# Patient Record
Sex: Female | Born: 1940 | Race: White | Hispanic: No | Smoking: Former smoker
Health system: Southern US, Community
[De-identification: ages and names within clinical notes are randomized; demographics above are authoritative.]

## PROBLEM LIST (undated history)

## (undated) DIAGNOSIS — E039 Hypothyroidism, unspecified: Secondary | ICD-10-CM

## (undated) DIAGNOSIS — R0600 Dyspnea, unspecified: Secondary | ICD-10-CM

## (undated) DIAGNOSIS — E119 Type 2 diabetes mellitus without complications: Secondary | ICD-10-CM

## (undated) DIAGNOSIS — M109 Gout, unspecified: Secondary | ICD-10-CM

## (undated) DIAGNOSIS — I739 Peripheral vascular disease, unspecified: Secondary | ICD-10-CM

## (undated) DIAGNOSIS — R569 Unspecified convulsions: Secondary | ICD-10-CM

## (undated) DIAGNOSIS — G4730 Sleep apnea, unspecified: Secondary | ICD-10-CM

## (undated) DIAGNOSIS — M1990 Unspecified osteoarthritis, unspecified site: Secondary | ICD-10-CM

## (undated) DIAGNOSIS — E785 Hyperlipidemia, unspecified: Secondary | ICD-10-CM

## (undated) DIAGNOSIS — I10 Essential (primary) hypertension: Secondary | ICD-10-CM

## (undated) HISTORY — PX: BREAST BIOPSY: SHX20

## (undated) HISTORY — DX: Hyperlipidemia, unspecified: E78.5

## (undated) HISTORY — DX: Unspecified osteoarthritis, unspecified site: M19.90

## (undated) HISTORY — DX: Gout, unspecified: M10.9

## (undated) HISTORY — PX: TOTAL KNEE ARTHROPLASTY: SHX125

## (undated) HISTORY — PX: MANDIBLE FRACTURE SURGERY: SHX706

## (undated) HISTORY — PX: JOINT REPLACEMENT: SHX530

## (undated) HISTORY — PX: DILATION AND CURETTAGE OF UTERUS: SHX78

---

## 1998-03-10 ENCOUNTER — Emergency Department (HOSPITAL_COMMUNITY): Admission: EM | Admit: 1998-03-10 | Discharge: 1998-03-10 | Payer: Self-pay | Admitting: Emergency Medicine

## 1998-05-08 ENCOUNTER — Other Ambulatory Visit: Admission: RE | Admit: 1998-05-08 | Discharge: 1998-05-08 | Payer: Self-pay

## 1999-07-27 ENCOUNTER — Other Ambulatory Visit: Admission: RE | Admit: 1999-07-27 | Discharge: 1999-07-27 | Payer: Self-pay | Admitting: Obstetrics and Gynecology

## 1999-07-27 ENCOUNTER — Encounter (INDEPENDENT_AMBULATORY_CARE_PROVIDER_SITE_OTHER): Payer: Self-pay

## 1999-08-06 ENCOUNTER — Ambulatory Visit (HOSPITAL_COMMUNITY): Admission: RE | Admit: 1999-08-06 | Discharge: 1999-08-06 | Payer: Self-pay | Admitting: Gastroenterology

## 2000-06-16 ENCOUNTER — Encounter: Payer: Self-pay | Admitting: Family Medicine

## 2000-06-16 ENCOUNTER — Encounter: Admission: RE | Admit: 2000-06-16 | Discharge: 2000-06-16 | Payer: Self-pay | Admitting: Family Medicine

## 2002-07-29 ENCOUNTER — Ambulatory Visit (HOSPITAL_BASED_OUTPATIENT_CLINIC_OR_DEPARTMENT_OTHER): Admission: RE | Admit: 2002-07-29 | Discharge: 2002-07-29 | Payer: Self-pay | Admitting: Orthopedic Surgery

## 2002-12-25 ENCOUNTER — Encounter: Payer: Self-pay | Admitting: Family Medicine

## 2002-12-25 ENCOUNTER — Encounter: Admission: RE | Admit: 2002-12-25 | Discharge: 2002-12-25 | Payer: Self-pay | Admitting: Family Medicine

## 2003-03-11 ENCOUNTER — Ambulatory Visit (HOSPITAL_COMMUNITY): Admission: RE | Admit: 2003-03-11 | Discharge: 2003-03-11 | Payer: Self-pay | Admitting: Family Medicine

## 2004-01-05 ENCOUNTER — Ambulatory Visit (HOSPITAL_COMMUNITY): Admission: RE | Admit: 2004-01-05 | Discharge: 2004-01-05 | Payer: Self-pay | Admitting: Family Medicine

## 2004-02-13 ENCOUNTER — Other Ambulatory Visit: Admission: RE | Admit: 2004-02-13 | Discharge: 2004-02-13 | Payer: Self-pay | Admitting: Family Medicine

## 2004-03-23 ENCOUNTER — Encounter: Admission: RE | Admit: 2004-03-23 | Discharge: 2004-06-21 | Payer: Self-pay | Admitting: Family Medicine

## 2004-04-07 ENCOUNTER — Ambulatory Visit (HOSPITAL_COMMUNITY): Admission: RE | Admit: 2004-04-07 | Discharge: 2004-04-07 | Payer: Self-pay | Admitting: Gastroenterology

## 2004-07-13 ENCOUNTER — Inpatient Hospital Stay (HOSPITAL_COMMUNITY): Admission: AD | Admit: 2004-07-13 | Discharge: 2004-07-25 | Payer: Self-pay | Admitting: Family Medicine

## 2005-02-23 ENCOUNTER — Other Ambulatory Visit: Admission: RE | Admit: 2005-02-23 | Discharge: 2005-02-23 | Payer: Self-pay | Admitting: Family Medicine

## 2005-08-29 ENCOUNTER — Ambulatory Visit (HOSPITAL_COMMUNITY): Admission: RE | Admit: 2005-08-29 | Discharge: 2005-08-29 | Payer: Self-pay | Admitting: Family Medicine

## 2006-05-31 ENCOUNTER — Inpatient Hospital Stay (HOSPITAL_COMMUNITY): Admission: RE | Admit: 2006-05-31 | Discharge: 2006-06-06 | Payer: Self-pay | Admitting: Orthopedic Surgery

## 2006-06-01 ENCOUNTER — Ambulatory Visit: Payer: Self-pay | Admitting: Physical Medicine & Rehabilitation

## 2006-10-06 ENCOUNTER — Ambulatory Visit (HOSPITAL_COMMUNITY): Admission: RE | Admit: 2006-10-06 | Discharge: 2006-10-06 | Payer: Self-pay | Admitting: Family Medicine

## 2006-10-10 ENCOUNTER — Emergency Department (HOSPITAL_COMMUNITY): Admission: EM | Admit: 2006-10-10 | Discharge: 2006-10-10 | Payer: Self-pay | Admitting: Family Medicine

## 2007-05-16 ENCOUNTER — Other Ambulatory Visit: Admission: RE | Admit: 2007-05-16 | Discharge: 2007-05-16 | Payer: Self-pay | Admitting: Family Medicine

## 2008-05-19 ENCOUNTER — Inpatient Hospital Stay (HOSPITAL_COMMUNITY): Admission: RE | Admit: 2008-05-19 | Discharge: 2008-05-24 | Payer: Self-pay | Admitting: Orthopedic Surgery

## 2009-05-28 ENCOUNTER — Encounter: Admission: RE | Admit: 2009-05-28 | Discharge: 2009-05-28 | Payer: Self-pay | Admitting: Family Medicine

## 2009-06-05 ENCOUNTER — Encounter: Admission: RE | Admit: 2009-06-05 | Discharge: 2009-06-05 | Payer: Self-pay | Admitting: Family Medicine

## 2010-09-07 DIAGNOSIS — D229 Melanocytic nevi, unspecified: Secondary | ICD-10-CM

## 2010-09-07 HISTORY — DX: Melanocytic nevi, unspecified: D22.9

## 2010-10-25 ENCOUNTER — Encounter: Payer: Self-pay | Admitting: Family Medicine

## 2011-02-15 NOTE — Op Note (Signed)
Sarah Bishop, Sarah Bishop                 ACCOUNT NO.:  [id number]   MEDICAL RECORD NO.:  [id number]          PATIENT TYPE:  INP   LOCATION:  2550                         FACILITY:  [facility]   PHYSICIAN:  Feliberto Gottron. Turner Daniels, M.D.   DATE OF BIRTH:  05-02-1941   DATE OF PROCEDURE:  05/19/2008  DATE OF DISCHARGE:                               OPERATIVE REPORT   PREOPERATIVE DIAGNOSIS:  End-stage arthritis left knee.   POSTOPERATIVE DIAGNOSIS:  End-stage arthritis left knee.   PROCEDURE:  Left total knee arthroplasty using DePuy Sigma RP components  4 femur, 4 tibia, 38 patella, 10-mm Sigma RP spacer.   SURGEON:  Feliberto Gottron.  Turner Daniels, MD   FIRST ASSISTANT:  Shirl Harris PA-C   ANESTHETIC:  General endotracheal and left femoral nerve block.   ESTIMATED BLOOD LOSS:  Minimal.   FLUID REPLACEMENT:  1500 mL of crystalloid.   DRAINS PLACED:  Foley catheter and two medium Hemovac.   URINE OUTPUT:  300 mL.   TOURNIQUET TIME:  1 hour and 40 minutes.   INDICATIONS FOR PROCEDURE:  A 70 year old woman has had a successful  cemented right total knee done by me a few years ago and now desires  same on the left side.  She is well aware of the risks and benefits of  surgery.  She has severe unremitting pain that affects her ability to do  activities of daily living and she desires to proceed with left total  knee arthroplasty.   DESCRIPTION OF PROCEDURE:  The patient was identified by armband and  underwent left femoral nerve block in the block area [facility].  She was then taken to operating room 1 where the appropriate anesthetic  monitors were attached and general endotracheal anesthesia was induced.  A Foley catheter was inserted.  She received 2 g of Ancef  preoperatively.  Tourniquet was then applied high at the left thigh.  Lateral post and foot positioner applied to the table and the left lower  extremity prepped and draped in the usual sterile fashion from the ankle  to the  tourniquet.  The limb was wrapped with an Esmarch bandage and the  tourniquet inflated to 350 mmHg.  I began the procedure by making an  anterior midline incision 20 cm in length centered over the patella.  Small bleeders in the skin and subcutaneous tissue were identified and  cauterized.  Transverse retinaculum was incised and reflected medially  allowing a medial parapatellar arthrotomy.  The patella was everted and  the prepatellar fat pad was resected.  We then sent about elevating the  superficial, medial, and collateral ligaments from anterior-posterior of  the proximal tibia leaving them intact distally.  The patella was then  everted and tucked under the fat of the skin laterally because of her  body habitus.  The knee was then hyperflexed exposing the notch and the  notch osteophytes were then removed with an osteotome allowing removal  of the ACL and PCL.  We then placed a posteromedial Z retractor, a  McGill retractor through the notch and a lateral [employer]  retractor  exposing the proximal tibia which was then entered with a step drill  followed by intramedullary rod and a 0 degree posterior slope cutting  guide set at 1 degree of posterior slope.  The cutting guide was pinned  into place allowing removal of 7-8 mm of bone medially, about 9 mm of  bone laterally because of her varus deformity and a good standard cut  was accomplished.  We then directed our attention to the distal femur  and entered the femoral canal 2 mm anterior to the PCL origin with a  step drill followed by an intramedullary rod with a cutting guide set at  11 mm for the distal cut 5 degrees left.  This was pinned into place  along the epicondylar axis and a distal femoral cut accomplished.  Using  the posterior referencing guide, we measured for a #3 femoral component,  set the cutting guide at 3 degrees of external rotation and pinned the  distal femur.  The chamfer cutting guide was then screwed into  place  over the pins.  The anterior-posterior chamfer cuts accomplished without  difficulty followed by the Sigma RP box cutting guide and box cut.  The  patella was measured at 23 mm.  It was felt that a 38 button would be  used.  We set the cutting guide at 15 because of the thickness of the  patella and removed the posterior 8-9 mm of the patella without  difficulty.  We sized for a 38 button and drilled the patella.  Once  again, the knee was hyperflexed exposing the proximal tibia.  Using the  standard retractors, we sized for a #4 tibial baseplate.  This was  pinned into place followed by the smokestack and the conical reamer  followed by the Deltafit keel punch.  A #4 right trial femoral component  was then hammered into place.  The lugs were drilled.  We snapped in a  10-mm Sigma RP trial.  The knee was reduced with a trial 38 button and  tracking was noted to be excellent.  The knee did come to full extension  and easily flexed to 130 degrees.  At this point, the trial components  were removed and all bony surfaces were Waterpik cleaned and dried with  suction and sponges.  At the back table, a double batch of DePuy HV  cement with 1500 mg of Zinacef was mixed and applied to all bony and  metallic mating surfaces except for the cut posterior condyles of the  femur itself.  In order we then hammered into place a #4 tibial  baseplate, a three right femoral component, a 10-mm Sigma RP bearing and  a 38-mm button.  All excess cement was removed and the cement was  allowed to cure.  We Waterpiked the wound clean one more time and placed  medium Hemovac drains.  Tracking was checked the final time.  We then  closed with running #1 Vicryl suture in the patellar tendon 0 and 2-0  undyed Vicryl suture in the subcutaneous tissue and staples in the skin.  A dressing of Xeroform, 4x4 dressing sponges, Webril and an Ace wrap was  then applied.  The tourniquet was let down.  The patient was  awakened  and taken to the recovery room without difficulty.      Feliberto Gottron. Turner Daniels, M.D.  Electronically Signed     FJR/MEDQ  D:  05/19/2008  T:  05/20/2008  Job:  [id number]

## 2011-02-18 NOTE — Consult Note (Signed)
NAMETRIANA, Sarah Bishop                 ACCOUNT NO.:  [id number]   MEDICAL RECORD NO.:  [id number]          PATIENT TYPE:  INP   LOCATION:  0359                         FACILITY:  [facility]   PHYSICIAN:  James L. Deterding, M.D.DATE OF BIRTH:  1941-05-15   DATE OF CONSULTATION:  DATE OF DISCHARGE:                                   CONSULTATION   CONSULTING PHYSICIAN:  Dr. Dorothe Pea   REASON FOR CONSULTATION:  Acute renal failure.   HISTORY OF PRESENT ILLNESS:  This is a 70 year old female with about a four  month history of a diagnosis of diabetes, history of three or four years of  hypertension, history of gout x2, history of DJD, obesity, chronic edema  requiring diuretics, hyperlipidemia who was treated for cellulitis as an  outpatient with Augmentin and developed progressively severe diarrhea for  five to six days prior to admission.  Creatinine on admission was 1.2 and  now is 1.7.   No past history of renal disease.  No history of stones, UTIs, family  history of renal disease.  No family history of hearing deficits,  musculoskeletal defects, or eye defects.  Patient was on Indocin, Diovan  prior to admission and got a contrasted CT on the day of admission on  October 11.   She has no known allergies.  Her sugars have been usually less than 200, she  says.  At the current time she says she feels miserable.   PAST MEDICAL HISTORY:  1.  As above.  2.  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 Madilyn Fireman July 2005.  3.  Diabetes.  4.  Gout.  5.  High blood pressure.  6.  DJD.  7.  Hyperlipidemia.  8.  Obesity.  9.  Seizure disorder since she was a child.   ALLERGIES:  No known drug allergies.   SOCIAL HISTORY:  She denies no tobacco or alcohol abuse at the current time.  She had a 12-pack-year history of smoking from age [age] and then smoked for  several years in her 60s.   FAMILY HISTORY:  Mother, father, and three children are healthy.  Mother  does have MS.   Grandfather had diabetes, high blood pressure, heart disease,  had a stroke and colon cancer.   REVIEW OF SYSTEMS:  HEENT:  No visual difficulties other than she wears  glasses.  No hearing difficulties, headaches.  PULMONARY:  She had chronic  bronchitis when she was a smoker and intermittently has bronchitis.  At the  current time no cough or sputum production.  She denies dyspnea on exertion  at the current time.  CARDIOVASCULAR:  Sleeps on two pillows.  No PND.  No  orthopnea.  Nocturia x1-2.  She has chronic lower extremity edema.  GASTROINTESTINAL:  She has diarrhea.  No indigestion or heartburn.  She had  the colon abnormalities before.  No history of hepatitis, yellow jaundice.  SKIN:  No problems.  MUSCULOSKELETAL:  Primarily arthritis in her hands and  some in her feet.  She has had gout x2.  She has been  on Indocin for that.   OBJECTIVE:  GENERAL:  Pale, obese female.  VITAL SIGNS:  Blood pressure 94/58 supine, heart rate 66, standing heart  rate is 102 going to 78/40.  HEENT:  No funduscopic abnormalities.  Pharynx unremarkable.  NECK:  No thyromegaly but she has posterior cervical adenopathy.  LUNGS:  Decreased breath sounds, but no rales, rhonchi, or wheezes.  CARDIOVASCULAR:  She slightly decreased expansion also.  S4.  Regular  rhythm.  Grade 2/6 holosystolic murmur heard best at the apex.  PMI is  slightly ___________ 5th intercostal space.  1+ edema.  Pulses are 2+/4+.  ABDOMEN:  Obese and distended, erratic bowel sounds, high pitched.  Abdomen  is extremely tender, more upper than lower abdomen.  SKIN:  Some stasis changes of the lower extremities with heme pigmentation.  Few seborrheic keratoses.   Chemistries:  Sodium 131, potassium 4.2, chloride 104, bicarbonate 20,  creatinine 4.7, BUN 61, glucose 109.  Hemoglobin 10, white count 22,100,  platelets 163,000 yesterday.  2700 mL in, 200 mL out.  UA:  15 mg/% ketones,  nitrite positive, 0-2 white cells and yeast, 0-2  red cells.   ASSESSMENT:  Acute renal failure, oliguric acute tubular necrosis secondary  to volume depletion in the setting of an ARB, nonsteroidal, and contrast.  Those three toxins made the volume depletion much more serious.   She is still hypovolemic and hypotensive and has poor renal perfusion.  She  is also acidemic.  She is still third spacing with an inflamed bowel.  Need  to maximize her renal blood flow with intravenous fluids and allow time for  the tubules to heal.  Need to correct her acid/base abnormalities.  Blood  pressure is the most pressing issue and may end up needing renal replacement  therapy as her GFR is only 15 mL/minute.  Her fractional excretion is  extremely low at the current time.  1.  Clostridium difficile on Flagyl.  2.  Diabetes mellitus, fair control.  3.  Hypotensive.  Need to discontinue her atenolol.  4.  Gout.  Hold off treatment at this time.  5.  Obesity.  6.  Seizure disorder at risk for malabsorption of her medications as well as      decreased seizure threshold with uremia.  Need to follow the levels of      her medications.   PLAN:  1.  IV fluids.  2.  Sodium bicarbonate IV with isotonic fluid.  3.  Glucose control.  4.  Check valproic levels.  5.  Discontinue her atenolol.  6.  Flagyl.  Question p.o. vancomycin indicated.      JLD/MEDQ  D:  07/16/2004  T:  07/16/2004  Job:  [id number]

## 2011-02-18 NOTE — Discharge Summary (Signed)
NAMEBONNETTA, ALLBEE                 ACCOUNT NO.:  [id number]   MEDICAL RECORD NO.:  [id number]          PATIENT TYPE:  INP   LOCATION:  5015                         FACILITY:  [facility]   PHYSICIAN:  Erskine Squibb B. Su Hilt, P.A.  DATE OF BIRTH:  03-24-1941   DATE OF ADMISSION:  05/31/2006  DATE OF DISCHARGE:  06/06/2006                                 DISCHARGE SUMMARY   PRIMARY DIAGNOSIS:  End stage degenerative joint disease of the right knee.   PROCEDURE:  Right total knee arthroplasty.   DISCHARGE SUMMARY:  Patient is a 70 year old with known right knee DJD.  She  has been followed for this for several years.  She has had increased weight-  bearing pain, and it now interferes with daily activities, has failed  conservative medications including NSAID's, physical therapy, and rest.  And  she now wishes to proceed with total knee arthroplasty after discussing  risks versus benefit.   ALLERGIES:  X-RAY DYE.   MEDICATIONS ON ADMISSION:  Atenolol, sertraline, furosemide, Zetia, Caduet,  Depakote, Metformin, Diovan, nabumetone which was stopped prior to her  surgery, calcium, multi vitamin, and an aspirin, which again was gone from a  325 to an 81 mg prior to surgery.   PAST MEDICAL HISTORY:  1. Hypertension.  2. Diabetes.  3. Osteoarthritis.  4. Seizures.  5. History of anemia.   PAST SURGICAL HISTORY:  Breast biopsy, jaw surgery, no difficulty with GT.   SOCIAL HISTORY:  No tobacco, positive ethanol 2 times a year.  No IV drug  abuse.  She is divorced.   FAMILY HISTORY:  Father deceased in World War II.  Mother had complications  of MS.   REVIEW OF SYSTEMS:  No fever, no chills, no bleeding, no significant recent  illness.  She does have upper dentures.  Vital signs:  Temperature 98.2,  pulse 52, respirations 18, blood pressure 116/58.  Patient is well  developed, well nourished, normocephalic, atraumatic.  EARS:  TM's clear.  EYES:  Pupils equal, round, reactive to light and  accommodation.  NOSE:  Patent.  THROAT:  Benign.  NECK:  Supple, full range of motion.  HEART:  Regular rate and rhythm.  ABDOMEN:  Soft, non tender.  EXTREMITIES:  The right knee shows a 5-degree flexion contracture.  Range of  motion 5-95 degrees.  NEURO:  Vastly intact.  SKIN:  Well within normal limits.  X-rays show bone on bone in the medial compartment of bilateral knees, end  stage DJD of the right knee.   LABORATORY DATA:  Preoperative labs including CBC, C-Met, chest x-ray, EKG,  PT, and PTT were within normal limits within the exception of hemoglobin of  11.9, glucose 117, BUN of 25.   HOSPITAL COURSE:  On the date of admission, the patient was taken to the  operating room at [facility] where she underwent a right total knee  arthroplasty using DePuy components with a #3 tibia/3 femur, a tibial tray  10 mm, and an oval patella 35 mm.  These were all cemented using Smart Set  cement with Zinacef placed in  the cement.  Patient placed on perioperative  antibiotics, she was placed on postoperative Coumadin prophylaxis with a  target INR of 1.5-2.0 with Lovenox coverage as well.  She was placed on a  PCA morphine pump for pain control.  CPM was begun in the PACU area.  Postoperative day #1, the patient was awake and alert, in moderate pain,  taking p.o.s well, no nausea or vomiting.  Vital signs were stable.  Hemoglobin 11.9 to 9.3, PT of 14.4, urine output 100 cc.  Dressing was dry,  she was neurovascular intact.  Drains were discontinued.  She continued with  physical therapy.  Postoperative day #2, patient was complaining of moderate  pain, no nausea or vomiting, T-max of 99.  Hemoglobin at 8.8, WBC of 13.2,  INR of 1.5.  Dressing with scant, dried blood.  The wounds are fine.  She  continued with physical therapy and was making good progress.  She was  evaluated for possible admission, per her request, and it was felt that she  would probably be better served with a home  health nurse and physical  therapist.  Postoperative day #3, the patient remained stable, slowly  improved physical therapy, wound was benign.  She was afebrile.  Foley was  discontinued.  Postoperative day #4, the patient was without complaint.  She  was afebrile.  Abdomen was soft, positive bowel sounds, wound was benign, no  sign of infection.  She had been making steady progress with physical  therapy but PT goals were not yet met.  Postoperative day #5, the patient  was voiding well.  She was out of bed without significant assistance.  She  was otherwise medically stable and making good progress.  However, she had  not yet done stairs and was also needing the help of her daughter who was  not yet arrived for her assistance.  Postoperative day #6, the patient was  complaining of moderate pain.  She was making steady progress with physical  therapy.  She was afebrile, vital signs were stable, INR 2.3, wound was  benign.  She was otherwise medically stable and orthopedically improved.  She was discharged home in care of her family.  She will have home health  PT, home health RN, DME's including rolling walker, 3 in 1, and CPM provided  by her home health agency, which in her case, was [facility].  Dressing changes q.d.   MEDICATION AT THE TIME OF DISCHARGE:  Tylox and Coumadin per [facility].  Resumed home med's and medical reconciliation sheet.   DIET:  ADA.   ACTIVITY:  Weightbearing as tolerated with walker and total knee  precautions.  Return to clinic approximately 1 week's time with Dr. Turner Daniels,  calling for an appointment.  She will, of course, return sooner is she is  having any fevers or temperatures greater than 101.      Laural Benes. Judithe Modest  D:  07/03/2006  T:  07/03/2006  Job:  [id number]

## 2011-02-18 NOTE — Discharge Summary (Signed)
NAME:  Sarah Bishop, Sarah Bishop                 ACCOUNT NO.:  [id number]   MEDICAL RECORD NO.:  [id number]          PATIENT TYPE:  INP   LOCATION:  0359                         FACILITY:  [facility]   PHYSICIAN:  Kela Millin, M.D.DATE OF BIRTH:  Jun 26, 1941   DATE OF ADMISSION:  07/13/2004  DATE OF DISCHARGE:                                 DISCHARGE SUMMARY   After the gradual improvement in patient's symptoms as well as leukocytosis,  it was noted on October 18 that the patient's leukocytosis worsened to 24.9,  and so the patient was started on oral vancomycin in addition to Flagyl. The  following hospital day, the patient's diarrhea was still worsening, and the  leukocytosis increased to 29,000, and so gastroenterology was consulted, and  Dr. Arlyce Dice saw the patient in the hospital and did a flexible sigmoidoscopy  on July 21, 2004 which showed pseudomembranous colitis in the descending  colon to the rectum. There was erythema present. It was characterized as  moderately active pseudomembranous colitis with multiple pseudomembranes and  mucosal edema. Dr. Arlyce Dice agreed with continuing the oral vancomycin and to  discontinue the Flagyl. The patient was also started on Questran for  symptomatic relief. Following this intervention, the patient's diarrhea  gradually improved, and her leukocytosis gradually resolved; her white cell  count prior to discharge is 10.9. She is afebrile and hemodynamically  stable. She does not have any abdominal pain, and she is tolerating p.o.  well. She will be discharged at this time on oral vancomycin 825 mg p.o.  q.i.d. for 9 days and then 825 mg p.o. b.i.d. for 7 days as recommended by  gastroenterology. She will also be on Questran 4 g in juice once daily 2  hours away from her other medications. As already been discussed, the Flagyl  was discontinued. She is to continue her other preadmission medications  except Indocin and Celebrex; she is to hold those until  she follows up with  Dr. Kevan Ny, and this has been discussed with the patient.   DISCHARGE CONDITION:  Improved.   FOLLOWUP CARE:  1.  Dr. Shaune Pollack in 5 to 7 days; CBC and basic chemistries to be checked      at that time.  2.  Dr. Madilyn Fireman as needed; the patient to call for appointment.      ACV/MEDQ  D:  07/25/2004  T:  07/25/2004  Job:  [id number]   cc:   Duncan Dull, M.D.  [address]  [place]  [place] [address]  Fax: [phone]   Everardo All. Madilyn Fireman, M.D.  [address]  [place]  [place] [address]  Fax: [phone]

## 2011-02-18 NOTE — Op Note (Signed)
[name], [name]                 ACCOUNT NO.:  [id number]   MEDICAL RECORD NO.:  [id number]          PATIENT TYPE:  INP   LOCATION:  2899                         FACILITY:  [facility]   PHYSICIAN:  Feliberto Gottron. Turner Daniels, M.D.   DATE OF BIRTH:  01/14/1941   DATE OF PROCEDURE:  DATE OF DISCHARGE:                                 OPERATIVE REPORT   PREOPERATIVE DIAGNOSIS:  End-stage arthritis right knee.   POSTOPERATIVE DIAGNOSIS:  End-stage arthritis right knee.   PROCEDURE:  Right total knee arthroplasty.   SURGEON:  Feliberto Gottron. Turner Daniels, MD   FIRST ASSISTANT:  Skip Mayer PA-C.   ANESTHETIC:  Endotracheal.   ESTIMATED BLOOD LOSS:  Minimal.   FLUID REPLACEMENT:  1500 mL crystalloid.   TOURNIQUET TIME:  Hour and 30 minutes.   IMPLANTS:  DePuy Sigma RP cemented components, #3 femur, #3 tibia, 10 PS  spacer Sigma RP spacer, 38 mm poly button.   INDICATIONS FOR PROCEDURE:  A 71 year old woman with end-stage arthritis of  the right knee who has failed conservative treatment, anti-inflammatory  medicines, narcotics, cortisone injections, physical therapy, she desires  elective total knee arthroplasty.  Risks and benefits of surgery were gone  over and well understood by the patient.  Questions answered.  She is  prepared for intervention.   DESCRIPTION OF PROCEDURE:  The patient identified by armband, taken to the  operating room at [facility] main hospital where the appropriate anesthetic  monitors were attached and general endotracheal anesthesia induced with the  patient supine position.  Lateral post and foot positioner applied to the  table and the right lower extremity prepped, draped in usual sterile fashion  from the ankle to the midthigh.  The limb was then wrapped with an Esmarch  bandage, tourniquet inflated to 350 mmHg and we began the procedure.  Prior  to inflating the tourniquet.  The patient did have a Foley catheter  inserted, also received 1 gram of Ancef.  Made an anterior  midline incision  about 20 cm in length, because of her obese body habitus,straight anterior  midline over the patella and centered on the patella.  Small bleeders in the  skin and subcutaneous tissue were identified and cauterized.  We cut down to  the level of the retinaculum over the patella and reflected medially and  laterally so that the patella could be tucked underneath fat pad.  A medial  parapatellar arthrotomy was then accomplished keeping a small cuff of tissue  medially for later repair and the patella was everted.  Prepatellar fat pad  was resected.  The superficial medial collateral ligament was elevated off  the anterior proximal tibia around the posterior medial corner leaving  intact distally to enhance rotation of the tibia as we flexed the knee up.  The patella was everted, the knee was hyperflexed, the anterior half of the  menisci resected, notch osteophytes removed and the cruciate ligaments were  also resected with electrocautery.  Tibia was translated anteriorly with a  posteromedial Z retractor.  A McHale retractor to the notch and lateral  Homan retractor. We  then entered the proximal tibia with the DePuy step  drill followed by the intramedullary rod and a 0 degrees slope cutting guide  which was pinned into place allowing removal of about 80 mm of bone medially  and 9-10 mm of bone laterally using the power oscillating saw and protecting  the posterior structures with retractors.  We then entered the distal femur  2 mm anterior to the PCL origin with the step drill followed by the IM rod  and a 5 degrees right cutting guide was then applied along the epicondylar  axis.  We set the distal femoral resection 11 mm and performed a resection,  sized for #3 femoral component, used the posterior resection guide and  pinned in place in 3 degrees external rotation to compensate for the medial  compartment arthritis.  The chamfer cutting guide was then hammered into   place and the anterior-posterior and chamfer cuts accomplished followed by  the box cut without difficulty with a power oscillating saw.  The patella  was then measured at 24 mm in size for a 38 mm button.  Cutting guide was  set at 16 mm and the posterior 8 mm of the patella resected, sized for the  38 button and drilled. The knee was then once again flexed up and the  proximal tibia was sized for a 3 tibial baseplate which was pinned into  place.  The smokestack was applied.  The conical reaming accomplished  followed by the Delta fit keel punch and central plug.  We then hammered a  #3 right trial femur into place and inserted a 10-mm Sigma RP trial.  The  knee was noted to come to full extension, flexed to 120 limited by adipose  tissue.  The patella tracked normally with the trial.  At this point all  trial components removed and the bony surfaces were water picked clean and  dried with suction and sponges, a double batch of DePuy Smart set cement  with 1500 mg Zinacef was then mixed at the back table and applied to all  bony and metallic mating surfaces except for the posterior femoral condyles.  In order we hammered into place a #3 tibial baseplate and removed excess  cement, a #3 right femoral component, removed excess cement, the 10-mm Sigma  RP rotating bearing was then snapped into place followed by 38 mm patellar  button on the patella and excess cement also removed.  The knee was then  reduced, taken through range of motion and found have excellent tracking.  Medium Hemovac drains placed deep in the wound as the cement hardened and  the wound was once again water picked clean and dried with suction and  sponges.  The parapatellar arthrotomy was closed with running #1 Vicryl  suture.  The subcutaneous tissue with 0-0 and 2-0 undyed Vicryl suture and  the skin with skin staples.  A dressing of Xeroform, 4x4 dressing sponges, Webril and Ace wrap applied.  The tourniquet was let  down.  The patient was  then awakened and taken to recovery room without difficulty.      Feliberto Gottron. Turner Daniels, M.D.  Electronically Signed     FJR/MEDQ  D:  05/31/2006  T:  06/01/2006  Job:  [id number]

## 2011-02-18 NOTE — Discharge Summary (Signed)
Sarah Bishop, Sarah Bishop                 ACCOUNT NO.:  [id number]   MEDICAL RECORD NO.:  [id number]          PATIENT TYPE:  INP   LOCATION:  0359                         FACILITY:  [facility]   PHYSICIAN:  Theone Stanley, MD   DATE OF BIRTH:  Nov 20, 1940   DATE OF ADMISSION:  07/13/2004  DATE OF DISCHARGE:  07/20/2004                                 DISCHARGE SUMMARY   ADMISSION DIAGNOSES:  1.  Clostridium difficile colitis.  2.  Diabetes.  3.  Gout.  4.  History of arteriovenous malformations.  5.  Seizure disorder.  6.  Hyperlipidemia.   DISCHARGE DIAGNOSES:  1.  Clostridium difficile colitis.  2.  Diabetes.  3.  Gout.  4.  History of arteriovenous malformations.  5.  Seizure disorder.  6.  Hyperlipidemia.  7.  Acute renal failure with resolution.   HOSPITAL COURSE:  Sarah Bishop is a very pleasant 70 year old Caucasian female  with a past medical history significant for diabetes, hypertension,  hypercholesterolemia, gout, seizure disorder, recently treated for  cellulitis with Augmentin by her primary care, presents with a complaint of  diarrhea for the past 5-6 days.  She reports loose, watery stools about 8-10  a day, with minimal relief with antidiarrheal medication.  She also has  abdominal cramping usually prior to the bowel movement and the cramping is  relieved after the bowel movement.  She also admits to dysuria and  subjective fevers.  She was seen by Dr. Dorothe Pea in this clinic and noted a  temperature of 103.4 with wbc's at 22, tachycardic, and appeared volume  depleted.  She was admitted to [facility] under the [facility] hospitalist  service for further evaluation and management.  Evaluation while in the  hospital revealed C. difficile colitis.  The patient on her admission was  started on Flagyl and Cipro.  Her initial white count on admission was 22  and over the next couple of days the patient slowly recovered and on October  17 her white count was 19.  She continued to  have problems with loose stools  for several days and finally by October 17 she was forming more formed  stools and less frequent diarrhea.  During her stay this was complicated by  acute renal failure.  On day #3 of her admission her creatinine had gone up  to 4.7.  After workup it was felt there were multiple reasons for this  episode of renal failure, including CT scan with IV contrast, episodes of  hypertension, history of NSAID use and ARB also, although her Indocin and  her ARB were discontinued on her admission, so that could be contributing to  this renal failure.  Nephrology was consulted initially and their  recommendations were renal ultrasound which was negative.  Electrolytes were  performed and __________ was calculated out to be 0.14%, indicating  prerenal.  The patient was given boluses and started on 250 cc an hour of IV  fluid.  The next day her creatinine was defervescing and by her discharge it  was down to normal at 1.  In regards  to her diabetes this was controlled via  diet and insulin sliding scale coverage.  The patient has a history of  hypertension which was not an issue during this stay.  The patient also has  a history of AVMs and noted on her admission she was anemic.  Iron studies  were performed which did show she was iron deficient.  She was started in  iron, vitamin C, and folic acid here in the hospital.  Further workup for  further evidence of AVM will be up to her primary care physician.  In  regards to her gout her Indocin was stopped on admission.  There was no  issue of gout while here in the hospital.   FOLLOW UP:  The patient will follow up with Dr. Shaune Pollack in 5-7 days.   DISCHARGE MEDICATIONS:  1.  Indocin 50 mg one p.o. daily.  She will hold off on this until she sees      Dr. Kevan Ny.  2.  Atenolol 50 mg one p.o. daily.  3.  Lipitor 20 mg one p.o. daily.  4.  Lasix 20 mg one p.o. daily.  5.  Depakote 250 mg t.i.d.  6.  Diovan 160 mg one  p.o. daily.  7.  Aspirin 81 mg one p.o. daily.  8.  Multivitamin one p.o. daily.  9.  Calcium 600 mg one p.o. b.i.d.  10. Flagyl 500 mg p.o. t.i.d., start initial treatment for 14 days, and      reassessment by Dr. Kevan Ny to consider further treatment for a total of      21 days.      AEJ/MEDQ  D:  07/19/2004  T:  07/19/2004  Job:  [id number]   cc:   Duncan Dull, M.D.  [address]  [place]  [place] [address]  Fax: [phone]

## 2011-02-18 NOTE — Op Note (Signed)
NAME:  Sarah Bishop, Sarah Bishop                           ACCOUNT NO.:  [id number]   MEDICAL RECORD NO.:  [id number]                   PATIENT TYPE:  AMB   LOCATION:  ENDO                                 FACILITY:  [facility]   PHYSICIAN:  John C. Madilyn Fireman, M.D.                 DATE OF BIRTH:  [birth year]   DATE OF PROCEDURE:  04/07/2004  DATE OF DISCHARGE:                                 OPERATIVE REPORT   PROCEDURE:  Colonoscopy with BICAP therapy.   INDICATIONS FOR PROCEDURE:  Occasional rectal bleeding in a 70 year old  patient with no prior colon screening.   DESCRIPTION OF PROCEDURE:  The patient was placed in the left lateral  decubitus position then placed on the pulse monitor with continuous low flow  oxygen delivered by nasal cannula. She was sedated with 50 mcg IV fentanyl  and 4 mg IV Versed. The Olympus video colonoscope was inserted into the  rectum and advanced to the cecum, confirmed by transillumination at  McBurney's point and visualization at the ileocecal valve and appendiceal  orifice. The prep was excellent. The cecum, ascending, transverse,  descending and sigmoid colon all appeared normal with no masses, polyps,  diverticula or other mucosal abnormalities.  Within the rectum, there was  seen a small area of oozing of blood about 5 cm proximal to the anal verge,  this was washed and appeared to be a small pinpoint AVM and it continued to  bleed.  BICAP probe at a setting of 20 was used to obliterate this lesion  with cessation of bleeding, no other abnormalities were seen. There were no  particularly enlarged internal hemorrhoids.  The scope was then withdrawn  and the patient returned to the recovery room in stable condition. The  patient tolerated the procedure well and there were no immediate  complications.   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   PLAN:  Observe for further bleeding and next colon screening by  sigmoidoscopy in five years.                                               John C. Madilyn Fireman, M.D.    JCH/MEDQ  D:  04/07/2004  T:  04/07/2004  Job:  [id number]   cc:   Duncan Dull, M.D.  [address]  [place]  [place] [address]  Fax: [phone]

## 2011-02-18 NOTE — Discharge Summary (Signed)
NAMEELBERT, Sarah Bishop                 ACCOUNT NO.:  [id number]   MEDICAL RECORD NO.:  [id number]          PATIENT TYPE:  INP   LOCATION:  5033                         FACILITY:  [facility]   PHYSICIAN:  Feliberto Gottron. Turner Daniels, M.D.   DATE OF BIRTH:  1940-11-27   DATE OF ADMISSION:  05/19/2008  DATE OF DISCHARGE:  05/24/2008                               DISCHARGE SUMMARY   CHIEF COMPLAINT:  Left knee pain.   HISTORY OF PRESENT ILLNESS:  This is a 70 year old lady with chief  complaint L Knee pain all risks and benefits of the procedure.   Her past medical history is significant for:  1. Type 2 diabetes mellitus.  2. Hypertension.  3. High cholesterol.  4. __________.   PAST SURGICAL HISTORY:  She has had a right total knee , breast biopsy,  and __________ .   SOCIAL HISTORY:  She is a nonsmoker.  __________ alcohol, and she lives  with her husband.   Family history is noncontributory.   ALLERGIES:  She has an allergy to CONTRAST DYE.   Her current medications include:  1. Metformin 500 mg 1 p.o. b.i.d.  2. Depakote 250 mg 1 p.o. t.i.d.  3. Atenolol 50 mg 1 p.o. b.i.d.  4. Actos 15 mg 1 p.o. daily.  5. Furosemide 20 mg 1 p.o. daily.  6. Diovan 320 mg 1 p.o. daily.  7. Caduet 10 mg 1 p.o. daily.   PHYSICAL EXAMINATION:  Gross examination of the left knee demonstrates  the patient to have tenderness to palpation over the medial and lateral  condyles.  Range of motion is estimated to be 0-90 degrees.  She is  neurovascularly intact.  X-rays demonstrate bone-on-bone degenerative  joint disease of the left knee.   PREOPERATIVE LABORA TIVE:  White blood cells 7.9, red blood cells 3.89,  hemoglobin 11.1, hematocrit 33.8, and platelets 213.  PT 13.1, INR 1.0,  and PTT 29.  Sodium 140, potassium 4.6, chloride 103, glucose 160, BUN  24, and creatinine 1.10.  Her urinalysis demonstrates a large bilirubin  but is, otherwise, within normal limits.   HOSPITAL COURSE:  Sarah Bishop was admitted to  [facility] on May 19, 2008, and she underwent a left total knee arthroplasty, performed by Dr.  Gean Birchwood.  A perioperative Foley catheter was placed.  Two Hemovac  drains were placed into the left knee.  The patient tolerated the  procedure well and was transferred to the orthopedic floor.  On the  first postoperative day, the patient was awake and alert.  She was  tolerating p.o. intake well and denied nausea or vomiting.  Her  hemoglobin was 9.0.  Her surgical dressing was partially soaked with  blood, so this was changed.  Her surgical drains were removed.  Her  potassium was 5.7 on this day.  Her Foley catheter was removed, and she  was evaluated by Physical Therapy and was able to ambulate 20 feet.  On  the second postoperative day, her hemoglobin was 7.6, and she complained  of fatigue, so she was transfused with 2 units of  packed red blood  cells.  Her potassium had decreased to 4.6 with IV fluids.  Her surgical  dressing was clean and dry.  She was not able to work up very well with  physical therapy secondary to her fatigue.  On the third postoperative  day, the patient was awake and alert.  She denied any nausea or  vomiting.  Her surgical dressing was changed.  Her incision was found to  be benign, and her hemoglobin had increased to 10.5 after her  transfusion on the previous day.  The patient was able to ambulate 25  feet with physical therapy.  On the fourth postoperative day, the  patient was awake and alert.  Her hemoglobin was 9.2.  She was able to  ambulate 90 feet with physical therapy but had not yet attempted to  climb the stairs.  On the third postoperative day, the patient was awake  and alert.  She reported that her pain was well controlled with oral  pain medicine.  Her hemoglobin was 9.5.  She was able to ambulate and  climb stairs with physical therapy, and she was discharged to her home.   DISPOSITION:  The patient was discharged home on May 24, 2008.  [facility] her wound, Coumadin, and physical therapy.  She was weightbearing as tolerated and return to the clinic in 1 week.  Her discharge medicines were as per the med rec with the addition of  Percocet 5 mg tablets 1-2 tablets p.o. q.4 h. p.r.n. pain and Coumadin  take as directed.   FINAL DIAGNOSIS:  End-stage degenerative joint disease of the left knee  with secondary diagnosis of acute blood loss anemia.      Sarah Harris, PA      Feliberto Gottron. Turner Daniels, M.D.  Electronically Signed    JW/MEDQ  D:  07/07/2008  T:  07/08/2008  Job:  [id number]

## 2011-02-18 NOTE — H&P (Signed)
NAMESIEDAH, Sarah Bishop                 ACCOUNT NO.:  [id number]   MEDICAL RECORD NO.:  [id number]          PATIENT TYPE:  INP   LOCATION:  0449                         FACILITY:  [facility]   PHYSICIAN:  Kela Millin, M.D.DATE OF BIRTH:  1940/10/13   DATE OF ADMISSION:  07/13/2004  DATE OF DISCHARGE:                                HISTORY & PHYSICAL   PRIMARY CARE PHYSICIAN:  Dr. Shaune Pollack   CHIEF COMPLAINT:  Persistent diarrhea.   HISTORY OF PRESENT ILLNESS:  The patient is a 70 year old white female with  past medical history significant for diabetes mellitus, hypertension,  hypercholesterolemia, gout, epilepsy/seizures, recently treated for  cellulitis with Augmentin by her primary care physician, who presents with  complaint of diarrhea x 5-6 days.  She reports that she has had loose/watery  stools, about 8-10 per day, with minimal relief using an antidiarrheal  medication.  She has also had abdominal cramping, usually just prior to the  bowel movement, and the cramping is relieved after the bowel movement.  She  also admits to dysuria and subjective fevers - She was seen by Dr. Dorothe Pea  at the clinic today and noted to have a temperature of 103.4, her WBCs were  also elevated at 22,000, and she was tachycardic and appeared clinically  volume depleted.  She is admitted to the [facility] for  further evaluation and management.  Ms. Stanbery admits to nausea and vomiting  x 1 day.  She denies eating out or eating any unusual or different foods.  She has not traveled recently.  She admits to generalized fatigue in the  past but denies chest pain, shortness of breath, PND, hematemesis,  hematuria.   PAST MEDICAL HISTORY:  1.  As stated above.  2.  History of AVMs - status post colonoscopy with BICAP therapy per Dr.      Madilyn Fireman in July 2005.  3.  Diabetes mellitus - diet controlled.  4.  Gout.   MEDICATIONS:  1.  Indocin 50 mg daily.  2.  Atenolol 50 mg p.o. daily.  3.  Celebrex 200 mg p.o. daily - the patient states that she stopped taking      this when she was started on Indocin for her gout.  4.  Lipitor 20 mg p.o. daily.  5.  Lasix 20 mg p.o. daily.  6.  Depakote 250 mg t.i.d.  7.  __________ 160 mg p.o. daily.   ALLERGIES:  NKDA.   SOCIAL HISTORY:  Denies tobacco, occasional alcohol, no illicit drug use.   FAMILY HISTORY:  Grandfather had diabetes, hypertension, and heart disease  as well as a stroke and colon cancer.   REVIEW OF SYSTEMS:  As per HPI.  Other comprehensive review of systems  negative.   PHYSICAL EXAMINATION:  GENERAL:  The patient is an obese, elderly white  female, in no acute distress, acutely ill-appearing.  VITAL SIGNS:  Her temperature is 101.7 with a blood pressure of 113/59,  pulse 113, O2 saturations 93% on room air, respiratory rate 20.  HEENT:  PERRL, EOMI, sclerae anicteric.  Dry mucous  membranes, no oral  exudates.  NECK:  Supple, no JVD, no thyromegaly, no carotid bruits appreciated.  LUNGS:  Clear to auscultation bilaterally, no crackles or wheezes.  CARDIOVASCULAR:  Tachycardic, normal S1, S2, regular.  ABDOMEN:  Soft, bowel sounds present, tenderness present in the right upper  quadrant as well as the lower abdomen.  No rebound tenderness.  Obese,  nondistended.  No organomegaly appreciated and no masses palpable.  EXTREMITIES:  Chronic stasis changes noted, trace edema, no cyanosis.  NEUROLOGIC:  Alert and oriented x 3, cranial nerves 2-12 grossly intact,  sensory grossly intact, nonfocal exam.   LABORATORY DATA:  EKG:  Sinus tachycardia, rate 112, occasional PVC.  Her  white cell count is 22.4, hemoglobin of 12.8 with a hematocrit of 37.3, and  platelet count 241.  These are the labs done at the office.  Her CMP, CT  scan of abdomen, urinalysis, blood cultures, serum amylase and lipase are  pending.   ASSESSMENT AND PLAN:  1.  Abdominal pain:  The patient also with diarrhea x 5-6 days following       antibiotics (Augmentin) for cellulitis per primary care physician, and      febrile.  We will obtain stool studies, CT scan of abdomen with contrast      to evaluate for possible infectious colitis/C. difficile.  We will start      empiric antibiotics.  We will also check a urinalysis with cultures and      sensitivity.  A serum amylase and lipase as well.  We will start      analgesics for pain control.  We will follow and consider      gastrointestinal surgery consult pending above studies.  The patient is      status post a colonoscopy per Dr. Madilyn Fireman in July 2005 - positive for      arterial venous malformations only.  2.  Volume depletion:  Hydrate with IV fluids and follow.  3.  Diabetes mellitus:  We will manage her Accu-Cheks, cover with sliding-      scale insulin, and will also check a hemoglobin A1C.  The patient states      her diabetes is diet controlled only.  4.  Hypertension:  Monitor blood pressures, follow, and resume medications.  5.  Hyperlipidemia:  Continue Lipitor.  6.  History of seizure/epilepsy:  Continue Depakote.  States her last      seizure was 12 years ago.      ACV/MEDQ  D:  07/13/2004  T:  07/13/2004  Job:  [id number]   cc:   Duncan Dull, M.D.  [address]  [place]  [place] [address]  Fax: [phone]   Jethro Bastos, M.D.  [address]  [place]  [place] [address]  Fax: [phone]

## 2011-02-18 NOTE — Op Note (Signed)
NAME:  Sarah Bishop, Sarah Bishop                           ACCOUNT NO.:  [id number]   MEDICAL RECORD NO.:  [id number]                   PATIENT TYPE:  AMB   LOCATION:  DSC                                  FACILITY:  [facility]   PHYSICIAN:  Feliberto Gottron. Turner Daniels, M.D.                DATE OF BIRTH:  Jul 09, 1941   DATE OF PROCEDURE:  07/29/2002  DATE OF DISCHARGE:                                 OPERATIVE REPORT   PREOPERATIVE DIAGNOSES:  1. Left knee medial meniscal tear.  2. Possible chondromalacia.   POSTOPERATIVE DIAGNOSES:  1. Left knee radial and parrot-beak medial meniscal tears.  2. Degenerative lateral meniscal tear.  3. Chondromalacia of the medial, lateral and patellofemoral compartment,     grade 3, fairly global actually.   PROCEDURES:  1. Left knee arthroscopic partial medial and lateral meniscectomy.  2. Debridement of grade 3 chondromalacia from all three compartments of the     knee.   SURGEON:  Feliberto Gottron. Turner Daniels, M.D.   FIRST ASSISTANT:  Skip Mayer, P.A.-C.   ANESTHESIA:  Local with IV sedation.   ESTIMATED BLOOD LOSS:  Minimal.   FLUIDS REPLACED:  800 cc of crystalloid.   DRAINS PLACED:  None.   TOURNIQUET TIME:  None.   INDICATIONS FOR PROCEDURE:  This is a 70 year old woman with medial joint  line pain of the left knee that has failed conservative treatment and now  desires elective arthroscopic evaluation and treatment of her left knee  after suffering with this pain for many weeks. She has failed conservative  treatment with anti-inflammatory medicines, rest, and activity modification.   DESCRIPTION OF PROCEDURE:  The patient identified by arm band, taken to the  operating room at [facility]. Appropriate anesthetic monitors  were attached and local anesthesia with IV sedation was induced into the  left lower extremity. Lateral __________ was applied to the table and the  left lower extremity prepped and draped in the usual sterile fashion from  the ankle  to the mid thigh.   We began the procedure by making standard inferomedial and inferolateral  parapatellar portals allowing introduction of the arthroscope through the  inferolateral portal and the outflow through the inferomedial portal. The  pump was set anywhere between 60 and 80 mmHg and the knee filled with normal  saline solution allowing diagnostic arthroscopy which revealed inflamed  synovium and grade 2-3 chondromalacia of the patellofemoral joint fairly  global throughout the patella and the trochlea.  This was debrided back to  stable margins using a 4.2 great white sucker shaver as well as a 3.5 gator  sucker shaver from [employer]. Similar findings were made to the articular  cartilage of the medial and lateral compartments. The medial compartment  also had a radial parrot-beak tear of the posterior horn of the medial  meniscus which was debrided with straight biters and the sucker shaver. The  lateral side had degenerative tearing, the lateral meniscus also was  debrided. The gutters were cleared.   Multiple small cartilaginous loose bodies were taken through the outflow and  these were probably from the articular cartilage lesions. The knee was then  washed out with normal saline solution, the arthroscopic instruments  removed.  A dressing of Xeroform, 4 x 4 dressings, sponges, Webril and an  Ace wrap applied.   The patient was then awakened and taken to the recovery room without  difficulty.                                               Feliberto Gottron. Turner Daniels, M.D.    Ovid Curd  D:  07/29/2002  T:  07/29/2002  Job:  [id number]

## 2011-07-01 LAB — CBC
HCT: 33.8 — ABNORMAL LOW
Hemoglobin: 11.1 — ABNORMAL LOW
MCHC: 32.8
MCV: 87
Platelets: 213
RBC: 3.89
RDW: 15.3
WBC: 7.9

## 2011-07-01 LAB — URINALYSIS, ROUTINE W REFLEX MICROSCOPIC
Glucose, UA: NEGATIVE
Hgb urine dipstick: NEGATIVE
Ketones, ur: NEGATIVE
Nitrite: NEGATIVE
Protein, ur: NEGATIVE
Specific Gravity, Urine: 1.02
Urobilinogen, UA: 1
pH: 6

## 2011-07-01 LAB — COMPREHENSIVE METABOLIC PANEL
ALT: 31
AST: 35
Albumin: 3.5
Alkaline Phosphatase: 75
BUN: 24 — ABNORMAL HIGH
CO2: 28
Calcium: 9.4
Chloride: 103
Creatinine, Ser: 1.1
GFR calc Af Amer: 60 — ABNORMAL LOW
GFR calc non Af Amer: 49 — ABNORMAL LOW
Glucose, Bld: 160 — ABNORMAL HIGH
Potassium: 4.6
Sodium: 140
Total Bilirubin: 0.5
Total Protein: 6.4

## 2011-07-01 LAB — PROTIME-INR
INR: 1
Prothrombin Time: 13.1

## 2011-07-01 LAB — DIFFERENTIAL
Basophils Absolute: 0
Basophils Relative: 1
Eosinophils Absolute: 0.2
Eosinophils Relative: 2
Lymphocytes Relative: 30
Lymphs Abs: 2.4
Monocytes Absolute: 0.9
Monocytes Relative: 11
Neutro Abs: 4.4
Neutrophils Relative %: 56

## 2011-07-01 LAB — APTT: aPTT: 29

## 2012-11-19 ENCOUNTER — Other Ambulatory Visit: Payer: Self-pay | Admitting: Family Medicine

## 2012-11-19 DIAGNOSIS — N950 Postmenopausal bleeding: Secondary | ICD-10-CM

## 2012-11-23 ENCOUNTER — Other Ambulatory Visit: Payer: Self-pay

## 2012-11-26 ENCOUNTER — Ambulatory Visit
Admission: RE | Admit: 2012-11-26 | Discharge: 2012-11-26 | Disposition: A | Discharge status: Home / Self Care | Payer: BC Managed Care – PPO | Source: Ambulatory Visit | Attending: Family Medicine | Admitting: Family Medicine

## 2012-11-26 ENCOUNTER — Other Ambulatory Visit: Payer: Self-pay

## 2012-11-26 DIAGNOSIS — N950 Postmenopausal bleeding: Secondary | ICD-10-CM

## 2012-12-18 ENCOUNTER — Encounter (HOSPITAL_COMMUNITY): Payer: Self-pay | Admitting: Pharmacist

## 2012-12-24 ENCOUNTER — Other Ambulatory Visit: Payer: Self-pay | Admitting: Obstetrics & Gynecology

## 2012-12-25 ENCOUNTER — Encounter (HOSPITAL_COMMUNITY)
Admission: RE | Admit: 2012-12-25 | Discharge: 2012-12-25 | Disposition: A | Discharge status: Home / Self Care | Payer: BC Managed Care – PPO | Source: Ambulatory Visit | Attending: Obstetrics & Gynecology | Admitting: Obstetrics & Gynecology

## 2012-12-25 ENCOUNTER — Encounter (HOSPITAL_COMMUNITY): Payer: Self-pay

## 2012-12-25 ENCOUNTER — Other Ambulatory Visit: Payer: Self-pay

## 2012-12-25 HISTORY — DX: Unspecified convulsions: R56.9

## 2012-12-25 HISTORY — DX: Type 2 diabetes mellitus without complications: E11.9

## 2012-12-25 HISTORY — DX: Essential (primary) hypertension: I10

## 2012-12-25 LAB — CBC
MCH: 30.4 pg (ref 26.0–34.0)
MCHC: 32.5 g/dL (ref 30.0–36.0)
MCV: 93.7 fL (ref 78.0–100.0)
Platelets: 191 10*3/uL (ref 150–400)
RBC: 3.78 MIL/uL — ABNORMAL LOW (ref 3.87–5.11)
RDW: 14.7 % (ref 11.5–15.5)

## 2012-12-25 LAB — BASIC METABOLIC PANEL
CO2: 25 mEq/L (ref 19–32)
Calcium: 9.7 mg/dL (ref 8.4–10.5)
Creatinine, Ser: 1.09 mg/dL (ref 0.50–1.10)
GFR calc non Af Amer: 49 mL/min — ABNORMAL LOW (ref >90)
Sodium: 140 mEq/L (ref 135–145)

## 2012-12-25 NOTE — Patient Instructions (Addendum)
20 JAZZALYNN RHUDY  12/25/2012   Your procedure is scheduled on:  12/26/12  Enter through the Main Entrance of [facility] at 930 AM.  Pick up the phone at the desk and dial [phone].   Call this number if you have problems the morning of surgery: [phone]   Remember:   Do not eat food:After Midnight.  Do not drink clear liquids: After Midnight.  Take these medicines the morning of surgery with A SIP OF WATER: Depakote, Hold Metformin for 21yrs prior to surgery   Do not wear jewelry, make-up or nail polish.  Do not wear lotions, powders, or perfumes. You may wear deodorant.  Do not shave 48 hours prior to surgery.  Do not bring valuables to the hospital.  Contacts, dentures or bridgework may not be worn into surgery.  Leave suitcase in the car. After surgery it may be brought to your room.  For patients admitted to the hospital, checkout time is 11:00 AM the day of discharge.   Patients discharged the day of surgery will not be allowed to drive home.  Name and phone number of your driver: daughter  Carroll Sage  Special Instructions: Shower using CHG 2 nights before surgery and the night before surgery.  If you shower the day of surgery use CHG.  Use special wash - you have one bottle of CHG for all showers.  You should use approximately 1/3 of the bottle for each shower.   Please read over the following fact sheets that you were given: Surgical Site Infection Prevention

## 2012-12-26 ENCOUNTER — Ambulatory Visit (HOSPITAL_COMMUNITY): Payer: BC Managed Care – PPO | Admitting: Anesthesiology

## 2012-12-26 ENCOUNTER — Encounter (HOSPITAL_COMMUNITY)
Admission: RE | Disposition: A | Discharge status: Home / Self Care | Payer: Self-pay | Source: Ambulatory Visit | Attending: Obstetrics & Gynecology

## 2012-12-26 ENCOUNTER — Ambulatory Visit (HOSPITAL_COMMUNITY)
Admission: RE | Admit: 2012-12-26 | Discharge: 2012-12-26 | Disposition: A | Discharge status: Home / Self Care | Payer: BC Managed Care – PPO | Source: Ambulatory Visit | Attending: Obstetrics & Gynecology | Admitting: Obstetrics & Gynecology

## 2012-12-26 ENCOUNTER — Encounter (HOSPITAL_COMMUNITY): Payer: Self-pay | Admitting: Anesthesiology

## 2012-12-26 DIAGNOSIS — N950 Postmenopausal bleeding: Secondary | ICD-10-CM | POA: Insufficient documentation

## 2012-12-26 DIAGNOSIS — D250 Submucous leiomyoma of uterus: Secondary | ICD-10-CM | POA: Insufficient documentation

## 2012-12-26 DIAGNOSIS — N840 Polyp of corpus uteri: Secondary | ICD-10-CM | POA: Insufficient documentation

## 2012-12-26 DIAGNOSIS — I10 Essential (primary) hypertension: Secondary | ICD-10-CM | POA: Insufficient documentation

## 2012-12-26 DIAGNOSIS — D251 Intramural leiomyoma of uterus: Secondary | ICD-10-CM | POA: Insufficient documentation

## 2012-12-26 DIAGNOSIS — E119 Type 2 diabetes mellitus without complications: Secondary | ICD-10-CM | POA: Insufficient documentation

## 2012-12-26 HISTORY — PX: DILITATION & CURRETTAGE/HYSTROSCOPY WITH VERSAPOINT RESECTION: SHX5571

## 2012-12-26 SURGERY — DILATATION & CURETTAGE/HYSTEROSCOPY WITH VERSAPOINT RESECTION
Anesthesia: General | Site: Vagina | Wound class: Clean Contaminated

## 2012-12-26 MED ORDER — METRONIDAZOLE IN NACL 5-0.79 MG/ML-% IV SOLN
500.0000 mg | Freq: Once | INTRAVENOUS | Status: AC
  Administered 2012-12-26: 500 mg via INTRAVENOUS
  Filled 2012-12-26: qty 100

## 2012-12-26 MED ORDER — PHENYLEPHRINE HCL 10 MG/ML IJ SOLN
INTRAMUSCULAR | Status: DC | PRN
  Administered 2012-12-26: 80 ug via INTRAVENOUS
  Administered 2012-12-26: 40 ug via INTRAVENOUS
  Administered 2012-12-26: 80 ug via INTRAVENOUS
  Administered 2012-12-26: 40 ug via INTRAVENOUS
  Administered 2012-12-26 (×4): 80 ug via INTRAVENOUS

## 2012-12-26 MED ORDER — FENTANYL CITRATE 0.05 MG/ML IJ SOLN
INTRAMUSCULAR | Status: DC | PRN
  Administered 2012-12-26 (×2): 50 ug via INTRAVENOUS

## 2012-12-26 MED ORDER — PROPOFOL 10 MG/ML IV EMUL
INTRAVENOUS | Status: DC | PRN
  Administered 2012-12-26: 20 mg via INTRAVENOUS
  Administered 2012-12-26: 180 mg via INTRAVENOUS

## 2012-12-26 MED ORDER — MIDAZOLAM HCL 2 MG/2ML IJ SOLN
INTRAMUSCULAR | Status: AC
  Filled 2012-12-26: qty 2

## 2012-12-26 MED ORDER — PHENYLEPHRINE 40 MCG/ML (10ML) SYRINGE FOR IV PUSH (FOR BLOOD PRESSURE SUPPORT)
PREFILLED_SYRINGE | INTRAVENOUS | Status: AC
  Filled 2012-12-26: qty 5

## 2012-12-26 MED ORDER — SODIUM CHLORIDE 0.9 % IR SOLN
Status: DC | PRN
  Administered 2012-12-26 (×2): 3000 mL

## 2012-12-26 MED ORDER — CHLOROPROCAINE HCL 1 % IJ SOLN
INTRAMUSCULAR | Status: DC | PRN
  Administered 2012-12-26: 20 mL

## 2012-12-26 MED ORDER — MIDAZOLAM HCL 5 MG/5ML IJ SOLN
INTRAMUSCULAR | Status: DC | PRN
  Administered 2012-12-26: 1 mg via INTRAVENOUS

## 2012-12-26 MED ORDER — ONDANSETRON HCL 4 MG/2ML IJ SOLN
INTRAMUSCULAR | Status: DC | PRN
  Administered 2012-12-26: 4 mg via INTRAVENOUS

## 2012-12-26 MED ORDER — FENTANYL CITRATE 0.05 MG/ML IJ SOLN
INTRAMUSCULAR | Status: AC
  Filled 2012-12-26: qty 2

## 2012-12-26 MED ORDER — PROPOFOL 10 MG/ML IV EMUL
INTRAVENOUS | Status: AC
  Filled 2012-12-26: qty 20

## 2012-12-26 MED ORDER — CHLOROPROCAINE HCL 1 % IJ SOLN
INTRAMUSCULAR | Status: AC
  Filled 2012-12-26: qty 30

## 2012-12-26 MED ORDER — ONDANSETRON HCL 4 MG/2ML IJ SOLN
INTRAMUSCULAR | Status: AC
  Filled 2012-12-26: qty 2

## 2012-12-26 MED ORDER — LACTATED RINGERS IV SOLN
INTRAVENOUS | Status: DC
  Administered 2012-12-26 (×2): via INTRAVENOUS

## 2012-12-26 MED ORDER — HYDROCODONE-ACETAMINOPHEN 5-500 MG PO TABS
1.0000 | ORAL_TABLET | Freq: Four times a day (QID) | ORAL | Status: DC | PRN
Start: 2012-12-26 — End: 2022-04-20

## 2012-12-26 MED ORDER — LIDOCAINE HCL (CARDIAC) 20 MG/ML IV SOLN
INTRAVENOUS | Status: DC | PRN
  Administered 2012-12-26: 50 mg via INTRAVENOUS

## 2012-12-26 MED ORDER — LIDOCAINE HCL (CARDIAC) 20 MG/ML IV SOLN
INTRAVENOUS | Status: AC
  Filled 2012-12-26: qty 5

## 2012-12-26 MED ORDER — FENTANYL CITRATE 0.05 MG/ML IJ SOLN: 25.0000 ug | INTRAMUSCULAR | Status: DC | PRN

## 2012-12-26 SURGICAL SUPPLY — 14 items
CANISTER SUCTION 2500CC (MISCELLANEOUS) ×4 IMPLANT
CATH ROBINSON RED A/P 16FR (CATHETERS) ×2 IMPLANT
CLOTH BEACON ORANGE TIMEOUT ST (SAFETY) ×2 IMPLANT
CONTAINER PREFILL 10% NBF 60ML (FORM) ×3 IMPLANT
DRESSING TELFA 8X3 (GAUZE/BANDAGES/DRESSINGS) ×2 IMPLANT
ELECTRODE RT ANGLE VERSAPOINT (CUTTING LOOP) ×1 IMPLANT
GLOVE BIO SURGEON STRL SZ 6.5 (GLOVE) ×2 IMPLANT
GLOVE BIOGEL PI IND STRL 7.0 (GLOVE) ×1 IMPLANT
GLOVE BIOGEL PI INDICATOR 7.0 (GLOVE) ×1
GOWN STRL REIN XL XLG (GOWN DISPOSABLE) ×6 IMPLANT
PACK HYSTEROSCOPY LF (CUSTOM PROCEDURE TRAY) ×2 IMPLANT
PAD OB MATERNITY 4.3X12.25 (PERSONAL CARE ITEMS) ×2 IMPLANT
TOWEL OR 17X24 6PK STRL BLUE (TOWEL DISPOSABLE) ×4 IMPLANT
WATER STERILE IRR 1000ML POUR (IV SOLUTION) ×2 IMPLANT

## 2012-12-26 NOTE — H&P (Signed)
  Sarah Bishop is an 72 y.o. female   RP:  PMB with probable endometrial polyp for [facility] resection, D+C  Pertinent Gynecological History: Menses: post-menopausal bleeding Blood transfusions: none Sexually transmitted diseases: no past history Previous GYN Procedures: DNC  Last mammogram: normal Last pap: normal  Menstrual History:  No LMP recorded.    Past Medical History  Diagnosis Date  . Hypertension   . Seizures     diagnosed age 52yrs, no seizures for "yrs"  . Diabetes mellitus without complication     Past Surgical History  Procedure Laterality Date  . Breast biopsy    . Total knee arthroplasty Bilateral   . Dilation and curettage of uterus    . Mandible fracture surgery      No family history on file.  Social History:  reports that she has quit smoking. She has never used smokeless tobacco. She reports that she does not drink alcohol or use illicit drugs.  Allergies:  Allergies  Allergen Reactions  . Ivp Dye (Iodinated Diagnostic Agents) Nausea And Vomiting    Prescriptions prior to admission  Medication Sig Dispense Refill  . acetaminophen-codeine (TYLENOL #3) 300-30 MG per tablet Take 1 tablet by mouth every 4 (four) hours as needed for pain.      Marland Kitchen allopurinol (ZYLOPRIM) 300 MG tablet Take 300 mg by mouth daily.      Marland Kitchen amLODipine (NORVASC) 10 MG tablet Take 10 mg by mouth daily.      Marland Kitchen atenolol (TENORMIN) 100 MG tablet Take 100 mg by mouth daily.      Marland Kitchen atorvastatin (LIPITOR) 80 MG tablet Take 80 mg by mouth every other day.      . divalproex (DEPAKOTE) 250 MG DR tablet Take 250 mg by mouth 3 (three) times daily.      . furosemide (LASIX) 20 MG tablet Take 20 mg by mouth daily.      Marland Kitchen losartan (COZAAR) 100 MG tablet Take 100 mg by mouth daily.      . metFORMIN (GLUCOPHAGE) 1000 MG tablet Take 1,000 mg by mouth 2 (two) times daily with a meal.      . Vitamin D, Ergocalciferol, (DRISDOL) 50000 UNITS CAPS Take 50,000 Units by mouth every 7 (seven) days.  Takes on Monday's        Blood pressure 141/56, pulse 66, temperature 97.3 F (36.3 C), temperature source Oral, height 5\' 4"  (1.626 m), weight 112.492 kg (248 lb), SpO2 98.00%.  Sonohysto:  IU lesion c/w polyp.  No results found for this or any previous visit (from the past 24 hour(s)).  No results found.  Assessment/Plan: Probable endometrial polyp for HSC resection with Versapoint, D+C  Zaley Talley,MARIE-LYNE 12/26/2012, 10:02 AM

## 2012-12-26 NOTE — Anesthesia Preprocedure Evaluation (Signed)
Anesthesia Evaluation  Patient identified by MRN, date of birth, ID band Patient awake    Reviewed: Allergy & Precautions, H&P , Patient's Chart, lab work & pertinent test results, reviewed documented beta blocker date and time   Airway Mallampati: IV TM Distance: >3 FB Neck ROM: full  Mouth opening: Limited Mouth Opening  Dental no notable dental hx. (+) Caps   Pulmonary  breath sounds clear to auscultation  Pulmonary exam normal       Cardiovascular hypertension, On Medications and On Home Beta Blockers Rhythm:regular Rate:Normal     Neuro/Psych    GI/Hepatic hiatal hernia,   Endo/Other  diabetes, Well ControlledMorbid obesity  Renal/GU      Musculoskeletal   Abdominal   Peds  Hematology   Anesthesia Other Findings Nl EKG; Ex tol limited by TKR x 2. NO CAD SX  Reproductive/Obstetrics                           Anesthesia Physical Anesthesia Plan  ASA: III  Anesthesia Plan: General   Post-op Pain Management:    Induction: Intravenous  Airway Management Planned: LMA  Additional Equipment:   Intra-op Plan:   Post-operative Plan:   Informed Consent: I have reviewed the patients History and Physical, chart, labs and discussed the procedure including the risks, benefits and alternatives for the proposed anesthesia with the patient or authorized representative who has indicated his/her understanding and acceptance.   Dental Advisory Given  Plan Discussed with: CRNA and Surgeon  Anesthesia Plan Comments: (  Discussed  general anesthesia, including possible nausea, instrumentation of airway, sore throat,pulmonary aspiration, etc. I asked if the were any outstanding questions, or  concerns before we proceeded. )        Anesthesia Quick Evaluation

## 2012-12-26 NOTE — Anesthesia Postprocedure Evaluation (Signed)
  Anesthesia Post-op Note  Anesthesia Post Note  Patient: Sarah Bishop  Procedure(s) Performed: Procedure(s) (LRB): DILATATION & CURETTAGE/HYSTEROSCOPY WITH VERSAPOINT RESECTION (N/A)  Anesthesia type: General  Patient location: PACU  Post pain: Pain level controlled  Post assessment: Post-op Vital signs reviewed  Last Vitals:  Filed Vitals:   12/26/12 1215  BP: 112/42  Pulse: 61  Temp:   Resp: 18    Post vital signs: Reviewed  Level of consciousness: sedated  Complications: No apparent anesthesia complications

## 2012-12-26 NOTE — Transfer of Care (Signed)
Immediate Anesthesia Transfer of Care Note  Patient: Sarah Bishop  Procedure(s) Performed: Procedure(s): DILATATION & CURETTAGE/HYSTEROSCOPY WITH VERSAPOINT RESECTION (N/A)  Patient Location: PACU  Anesthesia Type:General  Level of Consciousness: awake  Airway & Oxygen Therapy: Patient Spontanous Breathing  Post-op Assessment: Report given to PACU RN  Post vital signs: stable  Filed Vitals:   12/26/12 0929  BP: 141/56  Pulse: 66  Temp: 36.3 C    Complications: No apparent anesthesia complications

## 2012-12-26 NOTE — Op Note (Signed)
12/26/2012  11:29 AM  PATIENT:  Sarah Bishop  72 y.o. female  PRE-OPERATIVE DIAGNOSIS:  Postmenopausal Bleeding, Intrauterine lesions  POST-OPERATIVE DIAGNOSIS:  Postmenopausal Bleeding, Intrauterine lesions, probable polyps and myoma.  PROCEDURE:  Procedure(s): DILATATION & CURETTAGE/HYSTEROSCOPY WITH VERSAPOINT RESECTION  SURGEON:  Surgeon(s): Genia Del, MD  ASSISTANTS: none   ANESTHESIA:   general with LM  PROCEDURE:  Under general anesthesia with laryngeal mask the patient is in lithotomy position. She is prepped with Betadine on the suprapubic, vulvar and vaginal areas. She is prepped as usual. The bladder is emptied with a catheter. The vaginal exam reveals an anteverted uterus slightly enlarged mobile, no adnexal mass.  She has uterine prolapse with a cysto-rectocele.  The speculum is put in place in the vagina. The anterior lip of the cervix is grasped with a tenaculum. The hysterometry is at 10 cm. Dilation of the cervix with Hegar dilators up to #35 without difficulty. The operative hysteroscope with the VersaPoint is introduced in the intrauterine cavity. Pictures are taken showing the ostia, a few small polyps are present at the fundus and an intramural with a submucosal component myoma is present on the right anterior wall of the uterus.  All polyps are resected. The submucosal component of the myoma was resected. It is quite calcified.  The hysteroscope was removed. A systematic curettage of the intrauterine cavity is done with a sharp curet on all surfaces.  The resection specimen and the curettage specimen are sent together to pathology. We went back with the hysteroscope to assure good hemostasis which was confirmed and take more pictures.  All instruments were removed. The patient was brought to recovery room in good and stable status.  ESTIMATED BLOOD LOSS: 10 cc  FLUID DEFICIT:  60 cc   Intake/Output Summary (Last 24 hours) at 12/26/12 1129 Last data filed at  12/26/12 1110  Gross per 24 hour  Intake    800 ml  Output     85 ml  Net    715 ml     BLOOD ADMINISTERED:none   LOCAL MEDICATIONS USED:  Nesacaine 1%, Amount: 20 ml  SPECIMEN:  Source of Specimen:  Resection polyp/myoma and endometrial curettings  DISPOSITION OF SPECIMEN:  PATHOLOGY  COUNTS:  YES   PLAN OF CARE: Transfer to PACU  Genia Del MD  12/26/2012 at 11:32 am

## 2012-12-26 NOTE — Discharge Summary (Signed)
  Physician Discharge Summary  Patient ID: Sarah Bishop MRN: [id number] DOB/AGE: Aug 10, 1941 72 y.o.  Admit date: 12/26/2012 Discharge date: 12/26/2012  Admission Diagnoses: Postmenopausal Bleeding, Polyp  [id number]  Discharge Diagnoses: Postmenopausal Bleeding, Polyp  [id number]        Active Problems:   * No active hospital problems. *   Discharged Condition: good  Hospital Course: Outpatient  Consults: None  Treatments: surgery: Hysteroscopy with Versapoint resection and D+C  Disposition: D/C home     Medication List    TAKE these medications       acetaminophen-codeine 300-30 MG per tablet  Commonly known as:  TYLENOL #3  Take 1 tablet by mouth every 4 (four) hours as needed for pain.     allopurinol 300 MG tablet  Commonly known as:  ZYLOPRIM  Take 300 mg by mouth daily.     amLODipine 10 MG tablet  Commonly known as:  NORVASC  Take 10 mg by mouth daily.     atenolol 100 MG tablet  Commonly known as:  TENORMIN  Take 100 mg by mouth daily.     atorvastatin 80 MG tablet  Commonly known as:  LIPITOR  Take 80 mg by mouth every other day.     divalproex 250 MG DR tablet  Commonly known as:  DEPAKOTE  Take 250 mg by mouth 3 (three) times daily.     furosemide 20 MG tablet  Commonly known as:  LASIX  Take 20 mg by mouth daily.     HYDROcodone-acetaminophen 5-500 MG per tablet  Commonly known as:  VICODIN  Take 1 tablet by mouth every 6 (six) hours as needed for pain.     losartan 100 MG tablet  Commonly known as:  COZAAR  Take 100 mg by mouth daily.     metFORMIN 1000 MG tablet  Commonly known as:  GLUCOPHAGE  Take 1,000 mg by mouth 2 (two) times daily with a meal.     Vitamin D (Ergocalciferol) 50000 UNITS Caps  Commonly known as:  DRISDOL  Take 50,000 Units by mouth every 7 (seven) days. Takes on Monday's           Follow-up Information   Follow up with Jaymarion Trombly,MARIE-LYNE, MD In 3 weeks.   Contact information:   [address] [place]  [address] [phone]       Signed: Genia Del, MD 12/26/2012, 11:41 AM

## 2012-12-27 ENCOUNTER — Encounter (HOSPITAL_COMMUNITY): Payer: Self-pay | Admitting: Obstetrics & Gynecology

## 2013-05-29 ENCOUNTER — Other Ambulatory Visit (HOSPITAL_COMMUNITY): Payer: Self-pay | Admitting: Family Medicine

## 2013-05-29 DIAGNOSIS — Z1231 Encounter for screening mammogram for malignant neoplasm of breast: Secondary | ICD-10-CM

## 2013-05-31 ENCOUNTER — Ambulatory Visit (HOSPITAL_COMMUNITY)
Admission: RE | Admit: 2013-05-31 | Discharge: 2013-05-31 | Disposition: A | Discharge status: Home / Self Care | Payer: BC Managed Care – PPO | Source: Ambulatory Visit | Attending: Family Medicine | Admitting: Family Medicine

## 2013-05-31 DIAGNOSIS — Z1231 Encounter for screening mammogram for malignant neoplasm of breast: Secondary | ICD-10-CM | POA: Insufficient documentation

## 2013-07-12 ENCOUNTER — Encounter: Payer: BC Managed Care – PPO | Attending: Family Medicine | Admitting: *Deleted

## 2013-07-12 ENCOUNTER — Encounter: Payer: Self-pay | Admitting: *Deleted

## 2013-07-12 VITALS — Ht 63.5 in | Wt 250.7 lb

## 2013-07-12 DIAGNOSIS — Z713 Dietary counseling and surveillance: Secondary | ICD-10-CM | POA: Insufficient documentation

## 2013-07-12 DIAGNOSIS — E1149 Type 2 diabetes mellitus with other diabetic neurological complication: Secondary | ICD-10-CM | POA: Insufficient documentation

## 2013-07-12 NOTE — Progress Notes (Signed)
Medical Nutrition Therapy:  Appt start time: 0800 end time:  0900.  Assessment:  Patient here today for diabetes education. She has had diabetes for 7-8 years, and went to a class for education, but no additional education. She also has stage 3 kidney disease. She reports that her blood glucose is slowly rising, with fasting values between 175-180. HgbA1c 7.9. She is not following a restricted diet. She does eat convenience foods often, getting fast food/eating out frequently. She does not exercise as moving is difficult with 2 knee replacements. However, she does state that she has done water aerobics in the past, which she enjoyed. We discussed options at the [facility], including the Silver Sneakers program.   MEDICATIONS: Metformin 1000 mg BID   DIETARY INTAKE:   Usual eating pattern includes 3 meals and 2 snacks per day.  24-hr recall:  B ( AM): Biscuit, sausage OR bacon, egg, cheese biscuit OR cereal/oatmeal, bottled mocha latte drink  Snk ( AM): [employer], nutrition bar, half biscuit  L ( PM): Frozen dinner ([employer], etc), diet Coke/water Snk ( PM): None D ( PM): Fast food: BBQ dinner OR stew OR frozen pizza, diet Coke/[place] iced tea/water Snk ( PM): Candy, peanuts, chips Beverages: Water, diet Coke, iced tea  Usual physical activity: None, limited by 2 knee replacements  Estimated energy needs: 1500 calories 169 g carbohydrates 94 g protein 50 g fat  Progress Towards Goal(s):  In progress.   Nutritional Diagnosis:  NB-1.1 Food and nutrition-related knowledge deficit As related to diabetes.  As evidenced by patient report.    Intervention:  Nutrition counseling. We discussed basic carb counting, including foods with carbs, label reading, portion size, and meal planning.   Goals:  1. 2-3 carb portions at meals, 1 portion at snacks.  2. Use plate method to plan balanced meals, increasing intake of non-starchy vegetables.  3. Monitor portion size of foods.  4. Limit  sweetened drinks.  5. Plan for healthy snacks.  6. Work up to exercising at least 30 minutes 3 days weekly.   Handouts given during visit include:  Living well with diabetes booklet  Yellow meal plan card  Plate method handout  Monitoring/Evaluation:  Dietary intake, exercise, blood glucose, and body weight prn.

## 2013-10-22 ENCOUNTER — Ambulatory Visit
Admission: RE | Admit: 2013-10-22 | Discharge: 2013-10-22 | Disposition: A | Discharge status: Home / Self Care | Payer: Commercial Managed Care - PPO | Source: Ambulatory Visit | Attending: Family Medicine | Admitting: Family Medicine

## 2013-10-22 ENCOUNTER — Other Ambulatory Visit: Payer: Self-pay | Admitting: Family Medicine

## 2013-10-22 DIAGNOSIS — R059 Cough, unspecified: Secondary | ICD-10-CM

## 2013-10-22 DIAGNOSIS — R05 Cough: Secondary | ICD-10-CM

## 2014-09-30 IMAGING — US US PELVIS COMPLETE
1 series · 13 of 25 positions shown · non-contrast
Comparison: None.

CLINICAL DATA: Post menopausal bleeding.

TRANSABDOMINAL AND TRANSVAGINAL ULTRASOUND OF PELVIS
TECHNIQUE: Both transabdominal and transvaginal ultrasound
examinations of the pelvis were performed. Transabdominal technique
was performed for global imaging of the pelvis including uterus,
ovaries, adnexal regions, and pelvic cul-de-sac.
It was necessary to proceed with endovaginal exam following the
transabdominal exam to visualize the uterus, endometrium, ovaries
and adnexal regions.

[Series 1: us pelvis complete · 0.28mm/px · 13 of 62 slices shown]
[im 1/62]
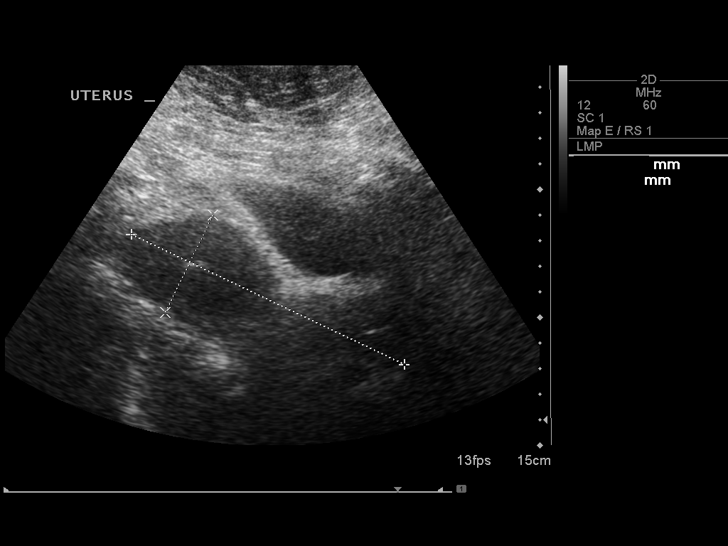
[im 6/62]
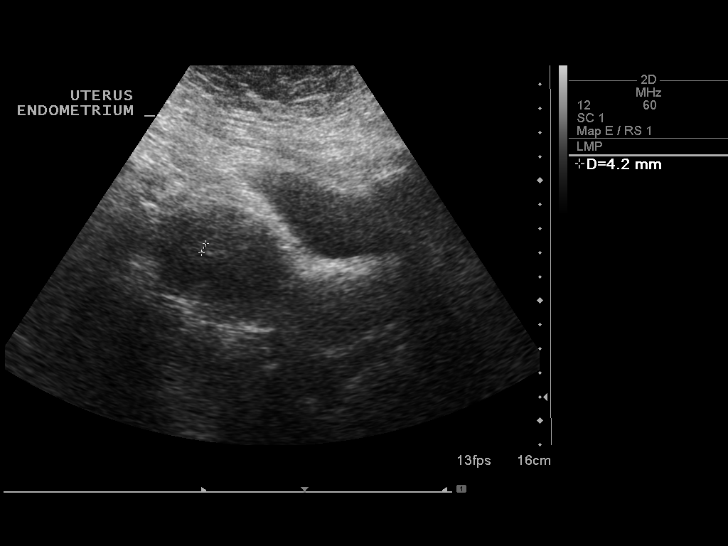
[im 11/62]
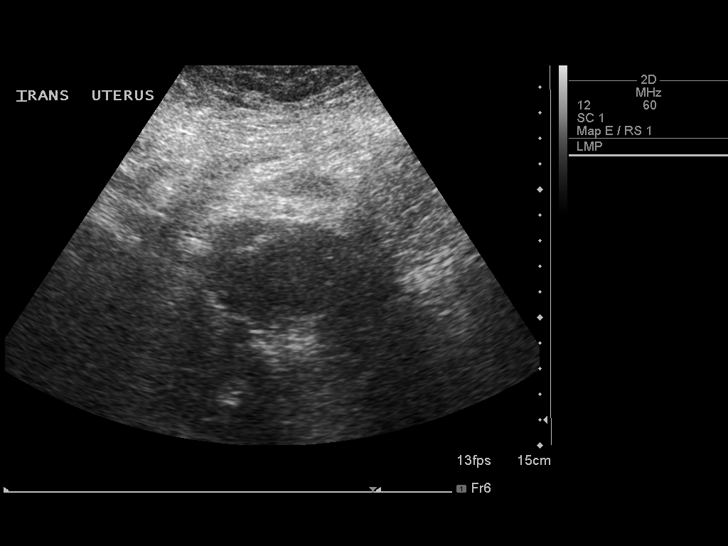
[im 16/62]
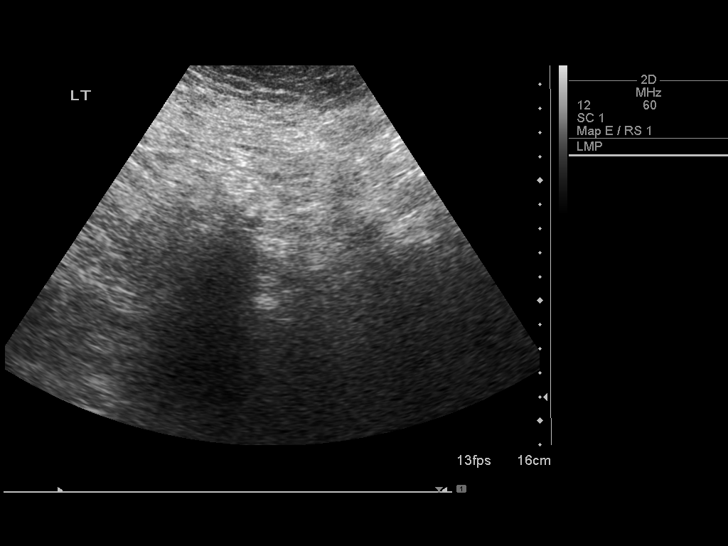
[im 21/62]
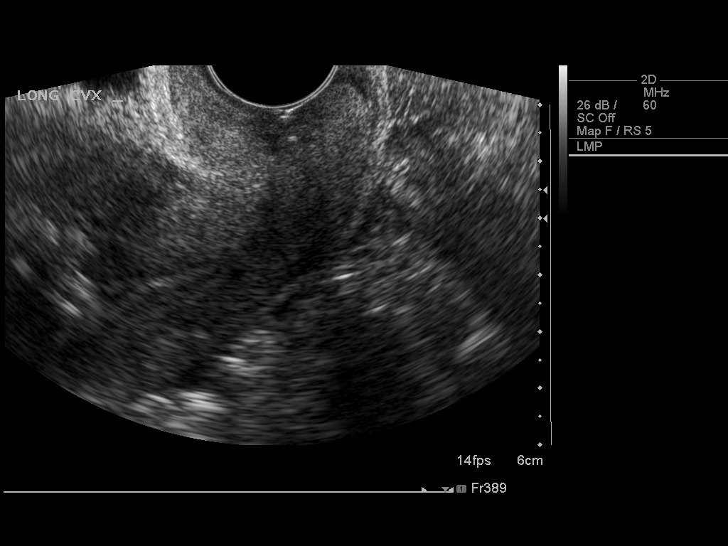
[im 26/62]
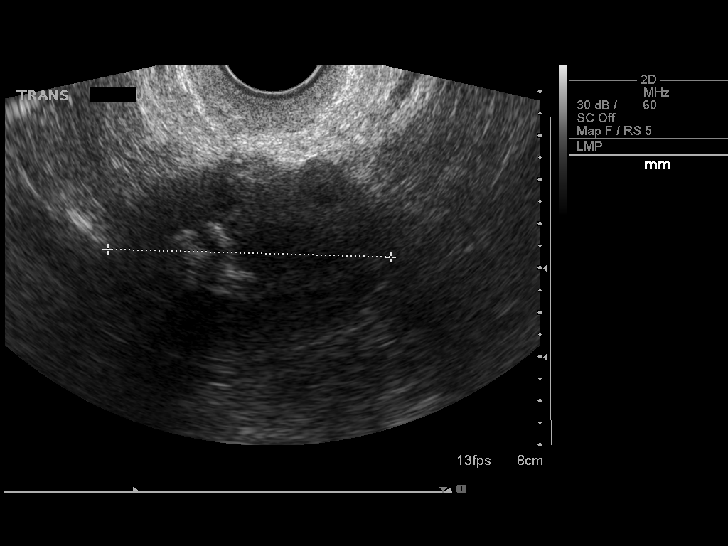
[im 31/62]
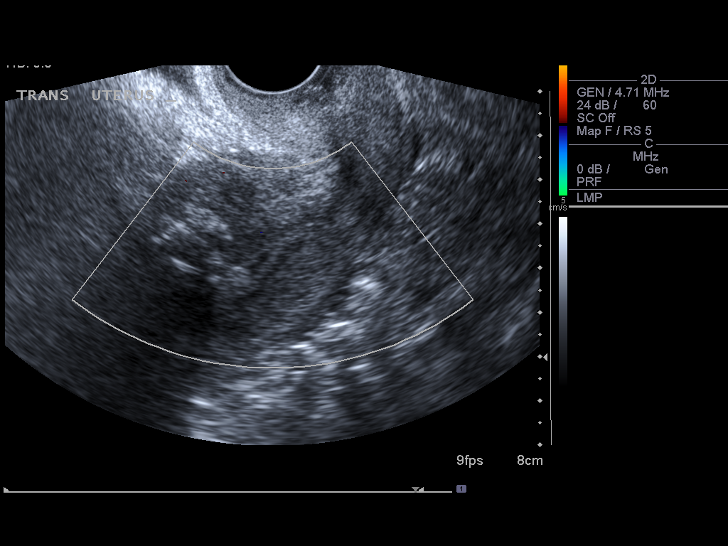
[im 36/62]
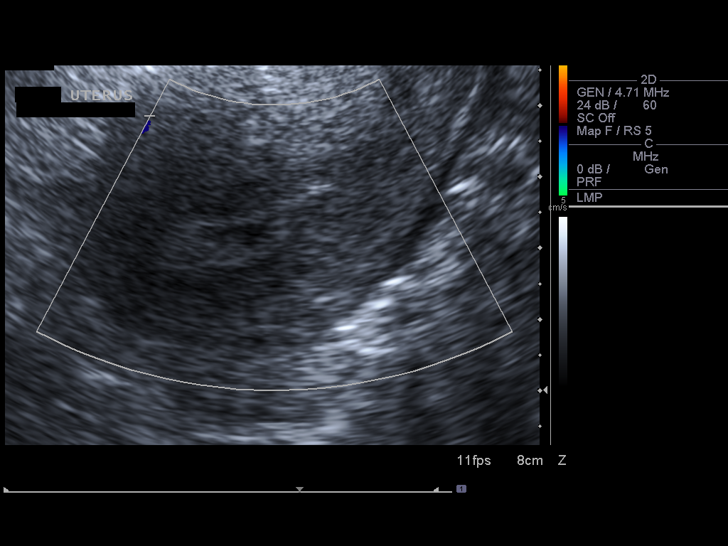
[im 41/62]
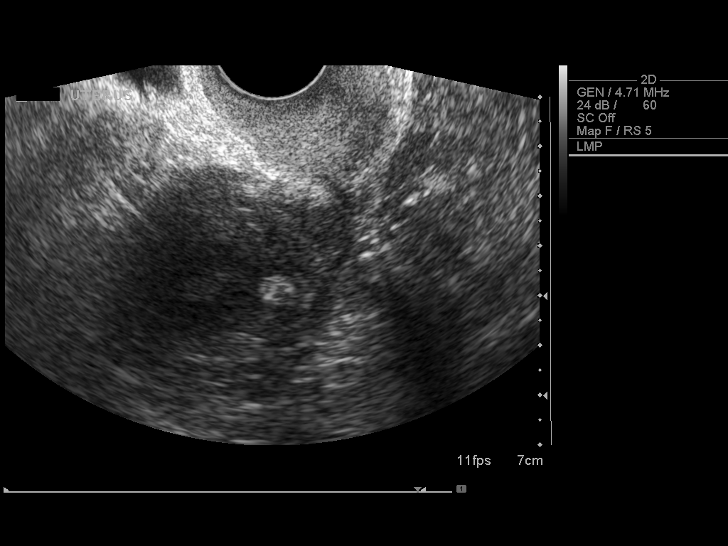
[im 46/62]
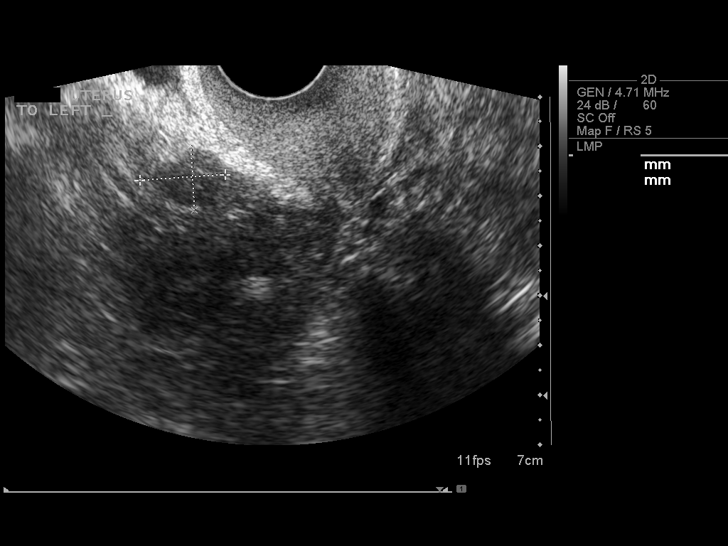
[im 51/62]
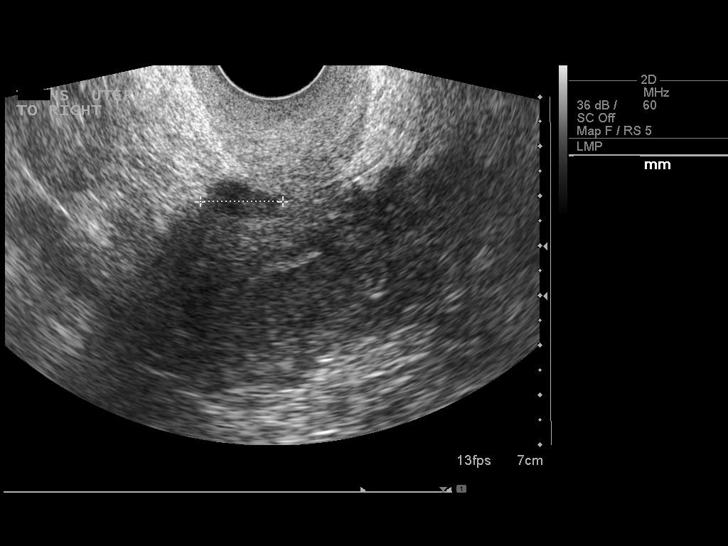
[im 56/62]
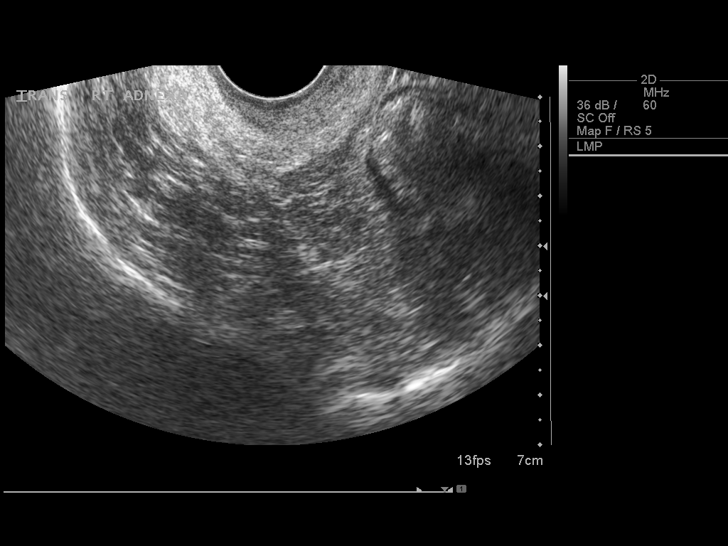
[im 62/62]
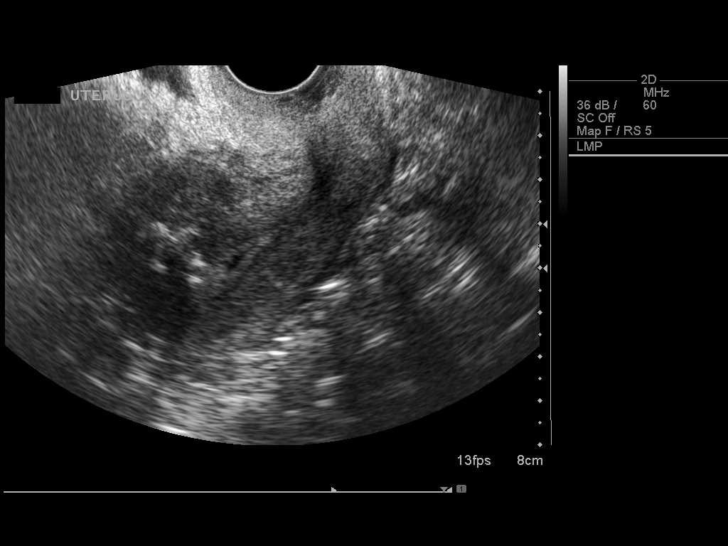

[13 of 25 positions shown; findings below may reference images not displayed]

FINDINGS: Uterus: Measures 11.8 x 4.2 x 5.7 cm and contains hypoechoic
lesions, some of which are calcified.  An intramural fibroid exerts
slight mass effect on the adjacent endometrium, measuring 2.7 x
x 2.1 cm.  Additionally which measure up to 1.7 x 1.3 x 1.4 cm.

Endometrium: Measures 10 mm with a focal polypoid appearing
thickening in the fundal region.  Small amount of fluid is seen in
the endometrial canal.

Right ovary:  Obscured by bowel gas.

Left ovary: Obscured by bowel gas.

Other findings: Trace free fluid.
IMPRESSION: 1.Endometrial thickening and fluid with focality in the fundal
region.  In the setting of post-menopausal bleeding, endometrial
sampling is indicated to exclude carcinoma.  If results are benign,
sonohysterogram should be considered for focal lesion work-up prior
to hysteroscopy. (Ref:  Radiological Reasoning: Algorithmic Workup
of Abnormal Vaginal Bleeding with Endovaginal Sonography and
Sonohysterography. AJR 2113; 191:S68-73).
2.  Uterine fibroids.

## 2015-07-17 ENCOUNTER — Other Ambulatory Visit: Payer: Self-pay

## 2015-07-17 DIAGNOSIS — Z1231 Encounter for screening mammogram for malignant neoplasm of breast: Secondary | ICD-10-CM

## 2015-08-14 ENCOUNTER — Ambulatory Visit
Admission: RE | Admit: 2015-08-14 | Discharge: 2015-08-14 | Disposition: A | Discharge status: Home / Self Care | Payer: Medicare Other | Source: Ambulatory Visit

## 2015-08-14 DIAGNOSIS — Z1231 Encounter for screening mammogram for malignant neoplasm of breast: Secondary | ICD-10-CM

## 2016-07-21 ENCOUNTER — Other Ambulatory Visit: Payer: Self-pay | Admitting: Family Medicine

## 2016-07-21 DIAGNOSIS — Z1231 Encounter for screening mammogram for malignant neoplasm of breast: Secondary | ICD-10-CM

## 2016-08-23 ENCOUNTER — Ambulatory Visit
Admission: RE | Admit: 2016-08-23 | Discharge: 2016-08-23 | Disposition: A | Discharge status: Home / Self Care | Payer: Medicare Other | Source: Ambulatory Visit | Attending: Family Medicine | Admitting: Family Medicine

## 2016-08-23 DIAGNOSIS — Z1231 Encounter for screening mammogram for malignant neoplasm of breast: Secondary | ICD-10-CM

## 2017-08-15 ENCOUNTER — Other Ambulatory Visit: Payer: Self-pay | Admitting: Family Medicine

## 2017-08-15 DIAGNOSIS — Z139 Encounter for screening, unspecified: Secondary | ICD-10-CM

## 2017-09-14 ENCOUNTER — Ambulatory Visit: Payer: Commercial Managed Care - PPO

## 2017-10-13 ENCOUNTER — Ambulatory Visit: Payer: Commercial Managed Care - PPO

## 2017-11-03 ENCOUNTER — Ambulatory Visit
Admission: RE | Admit: 2017-11-03 | Discharge: 2017-11-03 | Disposition: A | Discharge status: Home / Self Care | Payer: Commercial Managed Care - PPO | Source: Ambulatory Visit | Attending: Family Medicine | Admitting: Family Medicine

## 2017-11-03 DIAGNOSIS — Z139 Encounter for screening, unspecified: Secondary | ICD-10-CM

## 2018-09-18 ENCOUNTER — Other Ambulatory Visit: Payer: Self-pay | Admitting: Dermatology

## 2018-09-18 DIAGNOSIS — D099 Carcinoma in situ, unspecified: Secondary | ICD-10-CM

## 2018-09-18 HISTORY — DX: Carcinoma in situ, unspecified: D09.9

## 2018-11-21 ENCOUNTER — Other Ambulatory Visit: Payer: Self-pay | Admitting: Dermatology

## 2018-11-26 DIAGNOSIS — E039 Hypothyroidism, unspecified: Secondary | ICD-10-CM | POA: Insufficient documentation

## 2018-11-26 DIAGNOSIS — E785 Hyperlipidemia, unspecified: Secondary | ICD-10-CM | POA: Insufficient documentation

## 2018-11-26 DIAGNOSIS — I872 Venous insufficiency (chronic) (peripheral): Secondary | ICD-10-CM | POA: Insufficient documentation

## 2018-12-31 DIAGNOSIS — E559 Vitamin D deficiency, unspecified: Secondary | ICD-10-CM | POA: Insufficient documentation

## 2019-06-04 DIAGNOSIS — R944 Abnormal results of kidney function studies: Secondary | ICD-10-CM | POA: Insufficient documentation

## 2019-06-13 DIAGNOSIS — E213 Hyperparathyroidism, unspecified: Secondary | ICD-10-CM | POA: Insufficient documentation

## 2019-06-13 DIAGNOSIS — E875 Hyperkalemia: Secondary | ICD-10-CM | POA: Insufficient documentation

## 2020-05-21 DIAGNOSIS — D8989 Other specified disorders involving the immune mechanism, not elsewhere classified: Secondary | ICD-10-CM | POA: Insufficient documentation

## 2020-05-21 DIAGNOSIS — R600 Localized edema: Secondary | ICD-10-CM | POA: Insufficient documentation

## 2020-05-21 DIAGNOSIS — R531 Weakness: Secondary | ICD-10-CM | POA: Insufficient documentation

## 2020-05-21 DIAGNOSIS — R109 Unspecified abdominal pain: Secondary | ICD-10-CM | POA: Insufficient documentation

## 2020-07-26 DIAGNOSIS — F330 Major depressive disorder, recurrent, mild: Secondary | ICD-10-CM | POA: Insufficient documentation

## 2020-08-01 ENCOUNTER — Ambulatory Visit: Payer: Self-pay

## 2020-09-30 ENCOUNTER — Telehealth: Payer: Self-pay | Admitting: Dermatology

## 2020-09-30 NOTE — Telephone Encounter (Signed)
Phone call to patient to let her know she needs an appointment to re evaluate why she needs imiquimod. She is going to call us back to schedule once she speaks with daughter.

## 2020-09-30 NOTE — Telephone Encounter (Signed)
Patient is calling to get a refill on Imiquimod 5%.  Patient uses [facility] [address] in [place], [place].  (Patient had prescription,but left it in her car and she sold her car.)  Chart [phone]

## 2020-11-09 DIAGNOSIS — E038 Other specified hypothyroidism: Secondary | ICD-10-CM | POA: Insufficient documentation

## 2020-11-09 DIAGNOSIS — E785 Hyperlipidemia, unspecified: Secondary | ICD-10-CM | POA: Insufficient documentation

## 2020-11-09 DIAGNOSIS — E1169 Type 2 diabetes mellitus with other specified complication: Secondary | ICD-10-CM | POA: Insufficient documentation

## 2020-12-14 ENCOUNTER — Ambulatory Visit: Payer: Commercial Managed Care - PPO | Admitting: Dermatology

## 2020-12-28 ENCOUNTER — Other Ambulatory Visit: Payer: Self-pay

## 2020-12-28 ENCOUNTER — Encounter: Payer: Self-pay | Admitting: Dermatology

## 2020-12-28 ENCOUNTER — Ambulatory Visit (INDEPENDENT_AMBULATORY_CARE_PROVIDER_SITE_OTHER): Payer: Medicare Other | Admitting: Dermatology

## 2020-12-28 DIAGNOSIS — Z1283 Encounter for screening for malignant neoplasm of skin: Secondary | ICD-10-CM | POA: Diagnosis not present

## 2020-12-28 DIAGNOSIS — Z85828 Personal history of other malignant neoplasm of skin: Secondary | ICD-10-CM

## 2020-12-28 DIAGNOSIS — L299 Pruritus, unspecified: Secondary | ICD-10-CM | POA: Diagnosis not present

## 2020-12-28 DIAGNOSIS — D485 Neoplasm of uncertain behavior of skin: Secondary | ICD-10-CM

## 2020-12-28 DIAGNOSIS — Z8589 Personal history of malignant neoplasm of other organs and systems: Secondary | ICD-10-CM

## 2020-12-28 DIAGNOSIS — D0439 Carcinoma in situ of skin of other parts of face: Secondary | ICD-10-CM | POA: Diagnosis not present

## 2020-12-28 NOTE — Patient Instructions (Addendum)
Biopsy, Surgery (Curettage) & Surgery (Excision) Aftercare Instructions  1. Okay to remove bandage in 24 hours  2. Wash area with soap and water  3. Apply Vaseline to area twice daily until healed (Not Neosporin)  4. Okay to cover with a Band-Aid to decrease the chance of infection or prevent irritation from clothing; also it's okay to uncover lesion at home.  5. Suture instructions: return to our office in 7-10 or 10-14 days for a nurse visit for suture removal. Variable healing with sutures, if pain or itching occurs call our office. It's okay to shower or bathe 24 hours after sutures are given.  6. The following risks may occur after a biopsy, curettage or excision: bleeding, scarring, discoloration, recurrence, infection (redness, yellow drainage, pain or swelling).  7. For questions, concerns and results call our office at [place] before 4pm & Friday before 3pm. Biopsy results will be available in 1 week.   Use over to counter CeraVe Itch relief lotion for cutaneous dysaesthesia.

## 2021-01-08 ENCOUNTER — Encounter: Payer: Self-pay | Admitting: Dermatology

## 2021-01-14 ENCOUNTER — Ambulatory Visit: Payer: Commercial Managed Care - PPO | Admitting: Dermatology

## 2021-01-25 NOTE — Progress Notes (Signed)
   Follow-Up Visit   Subjective  Sarah Bishop is a 80 y.o. female who presents for the following: Skin Problem (Lesion under left eye that has returned. Per patient she doesn't remember if lesion was frozen or a shave was done. Tender to touch, x year, scabs, itches/Patient itches continuously from under breasts up to neck x 6 months.).  Nonhealing spot under left eye Location:  Duration:  Quality:  Associated Signs/Symptoms: Modifying Factors:  Severity:  Timing: Context:   Objective  Well appearing patient in no apparent distress; mood and affect are within normal limits. Objective  under left inner eye: Heaped up 47mm crust that most likely represents squamous cell carcinoma.     Objective  Head - Anterior (Face): Sun exposed areas and back examined.  Per ST cutaneous dysaesthesia upper body.   Over the counter CeraVe itch relief.  Objective  Neck - Anterior: Longstanding pruritus without visible lesions upper torso cephalad.  Bathing does not trigger or exacerbate the itching and there are no other known precipitating factors.  No signs of superior vena cava syndrome.  Allopurinol certainly produces a variety of significant cutaneous reactions but I am not aware of an association with localized pruritus.  This most likely represents a variant of cutaneous dysesthesia    A focused examination was performed including Head and neck.. Relevant physical exam findings are noted in the Assessment and Plan.   Assessment & Plan    History of squamous cell carcinoma Left Malar Cheek  Yearly skin check.  Neoplasm of uncertain behavior of skin under left inner eye  Skin / nail biopsy Type of biopsy: tangential   Informed consent: discussed and consent obtained   Timeout: patient name, date of birth, surgical site, and procedure verified   Procedure prep:  Patient was prepped and draped in usual sterile fashion (Non sterile) Prep type:  Chlorhexidine Anesthesia: the  lesion was anesthetized in a standard fashion   Anesthetic:  1% lidocaine w/ epinephrine 1-100,000 local infiltration Instrument used: flexible razor blade   Outcome: patient tolerated procedure well   Post-procedure details: wound care instructions given    Destruction of lesion Complexity: simple   Destruction method: electrodesiccation and curettage   Informed consent: discussed and consent obtained   Timeout:  patient name, date of birth, surgical site, and procedure verified Anesthesia: the lesion was anesthetized in a standard fashion   Anesthetic:  1% lidocaine w/ epinephrine 1-100,000 local infiltration Curettage performed in three different directions: Yes   Curettage cycles:  3 Lesion length (cm):  1.2 Lesion width (cm):  1.2 Margin per side (cm):  0 Final wound size (cm):  1.2 Hemostasis achieved with:  aluminum chloride Outcome: patient tolerated procedure well with no complications   Post-procedure details: wound care instructions given    Specimen 1 - Surgical pathology Differential Diagnosis: bcc vs scc [phone] Curet and cautery  Check Margins: No  After shave biopsy the base was treated with curettage plus cautery.  Screening exam for skin cancer Head - Anterior (Face)  Annual skin exam.  Pruritus Neck - Anterior  Discussed the option of trying neuroactive agents like gabapentin but for now no intervention initiated.      I, Lavonna Monarch, MD, have reviewed all documentation for this visit.  The documentation on 01/25/21 for the exam, diagnosis, procedures, and orders are all accurate and complete.

## 2021-02-19 DIAGNOSIS — L853 Xerosis cutis: Secondary | ICD-10-CM | POA: Insufficient documentation

## 2021-03-03 ENCOUNTER — Encounter: Payer: Self-pay | Admitting: Dermatology

## 2021-03-30 ENCOUNTER — Encounter: Payer: Self-pay | Admitting: Dermatology

## 2021-03-30 ENCOUNTER — Ambulatory Visit: Payer: Medicare Other | Admitting: Dermatology

## 2021-03-30 ENCOUNTER — Other Ambulatory Visit: Payer: Self-pay

## 2021-03-30 DIAGNOSIS — D0439 Carcinoma in situ of skin of other parts of face: Secondary | ICD-10-CM

## 2021-03-30 DIAGNOSIS — D485 Neoplasm of uncertain behavior of skin: Secondary | ICD-10-CM

## 2021-03-30 NOTE — Patient Instructions (Signed)

## 2021-04-16 ENCOUNTER — Encounter: Payer: Self-pay | Admitting: Dermatology

## 2021-04-16 NOTE — Progress Notes (Signed)
   Follow-Up Visit   Subjective  Sarah Bishop is a 80 y.o. 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 Per patient she still has a little scab under the eye. No new concerns. 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 No family history of atypical moles, melanoma or non mole skin cancer. ).  Follow-up CIS under left inner eye, still some residual crust Location:  Duration:  Quality:  Associated Signs/Symptoms: Modifying Factors:  Severity:  Timing: Context:   Objective  Well appearing patient in no apparent distress; mood and affect are within normal limits. Left Malar Cheek 6 mm focus of persistent waxy crust compatible with residual CIS.       A focused examination was performed including head and neck.. Relevant physical exam findings are noted in the Assessment and Plan.   Assessment & Plan    Neoplasm of uncertain behavior of skin Left Malar Cheek  Skin / nail biopsy Type of biopsy: tangential   Informed consent: discussed and consent obtained   Timeout: patient name, date of birth, surgical site, and procedure verified   Anesthesia: the lesion was anesthetized in a standard fashion   Anesthetic:  1% lidocaine w/ epinephrine 1-100,000 local infiltration Instrument used: flexible razor blade   Hemostasis achieved with: aluminum chloride and electrodesiccation   Outcome: patient tolerated procedure well   Post-procedure details: wound care instructions given    Destruction of lesion Complexity: simple   Destruction method: electrodesiccation and curettage   Informed consent: discussed and consent obtained   Timeout:  patient name, date of birth, surgical site, and procedure verified Anesthesia: the lesion was anesthetized in a standard fashion   Anesthetic:  1% lidocaine w/ epinephrine 1-100,000 local infiltration Curettage performed in three different directions: Yes    Curettage cycles:  3 Lesion length (cm):  0.6 Lesion width (cm):  0.6 Margin per side (cm):  0 Final wound size (cm):  0.6 Hemostasis achieved with:  aluminum chloride Outcome: patient tolerated procedure well with no complications   Post-procedure details: wound care instructions given    Specimen 1 - Surgical pathology Differential Diagnosis: bcc scc curet cautery Check Margins: No  After shave biopsy the base was treated with curettage plus electrocautery.  Possible Aldara topical tx with bx don't heal the lesion completely       I, Lavonna Monarch, MD, have reviewed all documentation for this visit.  The documentation on 04/16/21 for the exam, diagnosis, procedures, and orders are all accurate and complete.

## 2021-04-20 DIAGNOSIS — L989 Disorder of the skin and subcutaneous tissue, unspecified: Secondary | ICD-10-CM | POA: Insufficient documentation

## 2021-05-18 DIAGNOSIS — L03119 Cellulitis of unspecified part of limb: Secondary | ICD-10-CM | POA: Insufficient documentation

## 2021-05-18 DIAGNOSIS — L249 Irritant contact dermatitis, unspecified cause: Secondary | ICD-10-CM | POA: Insufficient documentation

## 2021-06-30 ENCOUNTER — Ambulatory Visit: Payer: Medicare Other | Admitting: Dermatology

## 2021-12-28 ENCOUNTER — Ambulatory Visit: Payer: Medicare Other | Admitting: Dermatology

## 2022-01-25 DIAGNOSIS — M79606 Pain in leg, unspecified: Secondary | ICD-10-CM | POA: Insufficient documentation

## 2022-01-25 DIAGNOSIS — R300 Dysuria: Secondary | ICD-10-CM | POA: Insufficient documentation

## 2022-01-25 DIAGNOSIS — G479 Sleep disorder, unspecified: Secondary | ICD-10-CM | POA: Insufficient documentation

## 2022-02-01 DIAGNOSIS — M170 Bilateral primary osteoarthritis of knee: Secondary | ICD-10-CM | POA: Insufficient documentation

## 2022-03-09 ENCOUNTER — Ambulatory Visit (INDEPENDENT_AMBULATORY_CARE_PROVIDER_SITE_OTHER): Payer: Medicare Other | Admitting: Primary Care

## 2022-03-09 ENCOUNTER — Encounter: Payer: Self-pay | Admitting: Primary Care

## 2022-03-09 VITALS — BP 116/68 | HR 67 | Temp 97.6°F | Ht 63.0 in | Wt 230.6 lb

## 2022-03-09 DIAGNOSIS — I10 Essential (primary) hypertension: Secondary | ICD-10-CM

## 2022-03-09 DIAGNOSIS — E1142 Type 2 diabetes mellitus with diabetic polyneuropathy: Secondary | ICD-10-CM | POA: Insufficient documentation

## 2022-03-09 DIAGNOSIS — Z87898 Personal history of other specified conditions: Secondary | ICD-10-CM | POA: Insufficient documentation

## 2022-03-09 DIAGNOSIS — Z8669 Personal history of other diseases of the nervous system and sense organs: Secondary | ICD-10-CM | POA: Diagnosis not present

## 2022-03-09 DIAGNOSIS — R011 Cardiac murmur, unspecified: Secondary | ICD-10-CM

## 2022-03-09 DIAGNOSIS — E1169 Type 2 diabetes mellitus with other specified complication: Secondary | ICD-10-CM | POA: Diagnosis not present

## 2022-03-09 DIAGNOSIS — E119 Type 2 diabetes mellitus without complications: Secondary | ICD-10-CM

## 2022-03-09 DIAGNOSIS — M1991 Primary osteoarthritis, unspecified site: Secondary | ICD-10-CM | POA: Insufficient documentation

## 2022-03-09 DIAGNOSIS — M069 Rheumatoid arthritis, unspecified: Secondary | ICD-10-CM | POA: Insufficient documentation

## 2022-03-09 DIAGNOSIS — R0683 Snoring: Secondary | ICD-10-CM | POA: Diagnosis not present

## 2022-03-09 DIAGNOSIS — E66813 Obesity, class 3: Secondary | ICD-10-CM | POA: Insufficient documentation

## 2022-03-09 DIAGNOSIS — G40909 Epilepsy, unspecified, not intractable, without status epilepticus: Secondary | ICD-10-CM | POA: Insufficient documentation

## 2022-03-09 DIAGNOSIS — M1000 Idiopathic gout, unspecified site: Secondary | ICD-10-CM | POA: Insufficient documentation

## 2022-03-09 HISTORY — DX: Epilepsy, unspecified, not intractable, without status epilepticus: G40.909

## 2022-03-09 HISTORY — DX: Essential (primary) hypertension: I10

## 2022-03-09 HISTORY — DX: Personal history of other diseases of the nervous system and sense organs: Z86.69

## 2022-03-09 HISTORY — DX: Cardiac murmur, unspecified: R01.1

## 2022-03-09 HISTORY — DX: Type 2 diabetes mellitus without complications: E11.9

## 2022-03-09 NOTE — Progress Notes (Signed)
$'[name]$  ID: Sarah Bishop, female    DOB: 01-Dec-1940, 81 y.o.   MRN: [id number]  Chief Complaint  Patient presents with   Consult    Tired during the day.  No restful sleep    Referring provider: Margretta Sidle, MD  HPI: 81 year old female, former smoker. Past medical history significant for obesity, rheumatoid arthritis, gout, HTN, seizures, diabetes, hypothyroidism, hyperlipidemia, kidney disease, hear murmur, peripheral edema, incontinence.   03/09/2022 Patient presents today for sleep consult.  Patient has symptoms of loud snoring, witnessed apnea, waking up gasping for air and daytime sleepiness.  He lives with her daughter who has sleep apnea herself and wears a CPAP. Typical bedtime is between 11 PM and 1 AM.  She sleeps in a recliner chair.  It does not take her long to fall asleep.  She starts her day at 6 AM.  She spends most of the day in her chair and is not very active.  She does not have much of a routine throughout the day.  She will fall asleep watching TV.  She has had no previous sleep study.  She is not on CPAP or oxygen.  Epworth score is 10.  She has a history of epilepsy, her last seizure was in 1993.  No symptoms of narcolepsy, cataplexy or sleepwalking.  Sleep questionnaire Symptoms- loud snoring, witness apnea, waking up gasping for air, daytime sleepiness Prior sleep study- None  Bedtime- Varies: between 11pm-1am Time to fall asleep-  not long Nocturnal awakenings- at least twice  Out of bed/start of day- 5-6am  Weight changes- stable, she has lost weight  Do you operate heavy machinery- No Do you currently wear CPAP- No Do you current wear oxygen- No Epworth- 10  Allergies  Allergen Reactions   Augmentin [Amoxicillin-Pot Clavulanate]    Ivp Dye [Iodinated Contrast Media] Nausea And Vomiting    Immunization History  Administered Date(s) Administered   Influenza-Unspecified 07/12/2018, 08/03/2021   Moderna Covid-19 Vaccine Bivalent Booster 65yr & up  08/18/2021   PFIZER Comirnaty(Gray Top)Covid-19 Tri-Sucrose Vaccine 11/09/2019, 11/30/2019, 08/01/2020    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     acetaminophen-codeine (TYLENOL #3) 300-30 MG per tablet Take 1 tablet by mouth every 4 (four) hours as needed for pain.     allopurinol (ZYLOPRIM) 300 MG tablet Take 300 mg by mouth daily.     amLODipine (NORVASC) 10 MG tablet Take 10 mg by mouth daily.     aspirin 325 MG tablet Take 325 mg by mouth daily. (Patient not taking: Reported on 03/09/2022)     atenolol (TENORMIN) 100 MG tablet Take 100 mg by mouth daily.     atorvastatin (LIPITOR) 80 MG tablet Take 80 mg by mouth every other day.     divalproex (DEPAKOTE) 250 MG DR tablet Take 250 mg by mouth 3 (three)  times daily.     furosemide (LASIX) 20 MG tablet Take 20 mg by mouth daily.     glipiZIDE (GLUCOTROL) 5 MG tablet Take 5 mg by mouth daily.     HYDROcodone-acetaminophen (VICODIN) 5-500 MG per tablet Take 1 tablet by mouth every 6 (six) hours as needed for pain. (Patient not taking: Reported on 03/09/2022) 20 tablet 0   Lancets (ONETOUCH DELICA PLUS [id number]) MISC Apply topically daily.     levothyroxine (SYNTHROID) 50 MCG tablet Take 50 mcg by mouth daily.     losartan (COZAAR) 100 MG tablet Take 100 mg by mouth daily.     Multiple Vitamins-Minerals (MULTIVITAMIN WITH MINERALS)  tablet Take 1 tablet by mouth daily. (Patient not taking: Reported on 03/09/2022)     Vitamin D, Ergocalciferol, (DRISDOL) 50000 UNITS CAPS Take 50,000 Units by mouth every 7 (seven) days. Takes on Monday's (Patient not taking: Reported on 03/09/2022)     metFORMIN (GLUCOPHAGE) 1000 MG tablet Take 1,000 mg by mouth 2 (two) times daily with a meal.     No facility-administered medications prior to visit.   Review of Systems  Review of Systems  Constitutional:  Positive for fatigue.  HENT: Negative.    Respiratory:  Positive for apnea.   Psychiatric/Behavioral:  Positive for sleep disturbance.     Physical Exam  BP 116/68 (BP Location: Right Arm, Patient Position: Sitting)   Pulse 67   Temp 97.6 F (36.4 C) (Oral)   Ht '5\' 3"'$  (1.6 m)   Wt 230 lb 9.6 oz (104.6 kg)   SpO2 96% Comment: RA  BMI 40.85 kg/m  Physical Exam Constitutional:      General: She is not in acute distress.    Appearance: Normal appearance. She is obese. She is not ill-appearing.  HENT:     Head: Normocephalic and atraumatic.     Mouth/Throat:     Mouth: Mucous membranes are moist.     Pharynx: Oropharynx is clear.     Comments: Mallampati class III Cardiovascular:     Rate and Rhythm: Normal rate and regular rhythm.     Heart sounds: Murmur heard.     Comments: +2-3 chronic LE edema Pulmonary:     Effort: Pulmonary effort is normal.     Breath sounds: Normal breath sounds.  Musculoskeletal:        General: Normal range of motion.     Cervical back: Normal range of motion and neck supple.  Skin:    General: Skin is warm and dry.  Neurological:     General: No focal deficit present.     Mental Status: She is alert and oriented to person, place, and time. Mental status is at baseline.  Psychiatric:        Mood and Affect: Mood normal.        Behavior: Behavior normal.        Thought Content: Thought content normal.        Judgment: Judgment normal.     Lab Results:  CBC    Component Value  Date/Time   WBC 8.6 12/25/2012 0850   RBC 3.78 (L) 12/25/2012 0850   HGB 11.5 (L) 12/25/2012 0850   HCT 35.4 (L) 12/25/2012 0850   PLT 191 12/25/2012 0850   MCV 93.7 12/25/2012 0850   MCH 30.4 12/25/2012 0850   MCHC 32.5 12/25/2012 0850   RDW 14.7 12/25/2012 0850   LYMPHSABS 2.4 05/13/2008 1328   MONOABS 0.9 05/13/2008 1328   EOSABS 0.2 05/13/2008 1328  BASOSABS 0.0 05/13/2008 1328    BMET    Component Value Date/Time   NA 140 12/25/2012 0850   K 4.4 12/25/2012 0850   CL 100 12/25/2012 0850   CO2 25 12/25/2012 0850   GLUCOSE 148 (H) 12/25/2012 0850   BUN 28 (H) 12/25/2012 0850   CREATININE 1.09 12/25/2012 0850   CALCIUM 9.7 12/25/2012 0850   GFRNONAA 49 (L) 12/25/2012 0850   GFRAA 57 (L) 12/25/2012 0850    BNP No results found for: BNP  ProBNP No results found for: PROBNP  Imaging: No results found.   Assessment & Plan:   Loud snoring - Patient has symptoms loud snoring, witnessed apnea, waking up gasping for air, daytime sleepiness.  Epworth 10. BMI 40.  Concern patient could have obstructive sleep apnea, needs home sleep study to evaluate. Discussed risk of untreated sleep apnea including cardiac arrhythmias, pulm HTN, stroke, DM. We briefly reviewed treatment options. Encouraged patient to work on weight loss efforts and focus on side sleeping position/elevate head of bed. Advised against driving if experiencing excessive daytime sleepiness. Follow-up in 4-6 weeks to review sleep study results and discuss treatment options further     Martyn Ehrich, NP 03/09/2022

## 2022-03-09 NOTE — Progress Notes (Signed)
Reviewed and agree with assessment/plan.   Chesley Mires, MD [facility] Pulmonary/Critical Care 03/09/2022, 12:44 PM Pager:  [phone]

## 2022-03-09 NOTE — Patient Instructions (Addendum)
Sleep apnea is defined as period of 10 seconds or longer when you stop breathing at night. This can happen multiple times a night. Dx sleep apnea is when this occurs more than 5 times an hour.    Mild OSA 5-15 apneic events an hour Moderate OSA 15-30 apneic events an hour Severe OSA > 30 apneic events an hour   Untreated sleep apnea puts you at higher risk for cardiac arrhythmias, pulmonary HTN, stroke and diabetes   Treatment options include weight loss, side sleeping position, oral appliance, CPAP therapy or referral to ENT for possible surgical options    Recommendations: Focus on side sleeping position or elevated head of bed  Work on weight loss efforts  Do not drive if experiencing excessive daytime sleepiness of fatigue    Orders: Home sleep study re: loud snoring    Follow-up: Please call and schedule follow-up 1-2 weeks after completing home sleep study to review results and treatment if needed   Sleep Apnea Sleep apnea is a condition in which breathing pauses or becomes shallow during sleep. People with sleep apnea usually snore loudly. They may have times when they gasp and stop breathing for 10 seconds or more during sleep. This may happen many times during the night. Sleep apnea disrupts your sleep and keeps your body from getting the rest that it needs. This condition can increase your risk of certain health problems, including: Heart attack. Stroke. Obesity. Type 2 diabetes. Heart failure. Irregular heartbeat. High blood pressure. The goal of treatment is to help you breathe normally again. What are the causes?  The most common cause of sleep apnea is a collapsed or blocked airway. There are three kinds of sleep apnea: Obstructive sleep apnea. This kind is caused by a blocked or collapsed airway. Central sleep apnea. This kind happens when the part of the brain that controls breathing does not send the correct signals to the muscles that control breathing. Mixed  sleep apnea. This is a combination of obstructive and central sleep apnea. What increases the risk? You are more likely to develop this condition if you: Are overweight. Smoke. Have a smaller than normal airway. Are older. Are female. Drink alcohol. Take sedatives or tranquilizers. Have a family history of sleep apnea. Have a tongue or tonsils that are larger than normal. What are the signs or symptoms? Symptoms of this condition include: Trouble staying asleep. Loud snoring. Morning headaches. Waking up gasping. Dry mouth or sore throat in the morning. Daytime sleepiness and tiredness. If you have daytime fatigue because of sleep apnea, you may be more likely to have: Trouble concentrating. Forgetfulness. Irritability or mood swings. Personality changes. Feelings of depression. Sexual dysfunction. This may include loss of interest if you are female, or erectile dysfunction if you are female. How is this diagnosed? This condition may be diagnosed with: A medical history. A physical exam. A series of tests that are done while you are sleeping (sleep study). These tests are usually done in a sleep lab, but they may also be done at home. How is this treated? Treatment for this condition aims to restore normal breathing and to ease symptoms during sleep. It may involve managing health issues that can affect breathing, such as high blood pressure or obesity. Treatment may include: Sleeping on your side. Using a decongestant if you have nasal congestion. Avoiding the use of depressants, including alcohol, sedatives, and narcotics. Losing weight if you are overweight. Making changes to your diet. Quitting smoking. Using a device  to open your airway while you sleep, such as: An oral appliance. This is a custom-made mouthpiece that shifts your lower jaw forward. A continuous positive airway pressure (CPAP) device. This device blows air through a mask when you breathe out (exhale). A  nasal expiratory positive airway pressure (EPAP) device. This device has valves that you put into each nostril. A bi-level positive airway pressure (BIPAP) device. This device blows air through a mask when you breathe in (inhale) and breathe out (exhale). Having surgery if other treatments do not work. During surgery, excess tissue is removed to create a wider airway. Follow these instructions at home: Lifestyle Make any lifestyle changes that your health care provider recommends. Eat a healthy, well-balanced diet. Take steps to lose weight if you are overweight. Avoid using depressants, including alcohol, sedatives, and narcotics. Do not use any products that contain nicotine or tobacco. These products include cigarettes, chewing tobacco, and vaping devices, such as e-cigarettes. If you need help quitting, ask your health care provider. General instructions Take over-the-counter and prescription medicines only as told by your health care provider. If you were given a device to open your airway while you sleep, use it only as told by your health care provider. If you are having surgery, make sure to tell your health care provider you have sleep apnea. You may need to bring your device with you. Keep all follow-up visits. This is important. Contact a health care provider if: The device that you received to open your airway during sleep is uncomfortable or does not seem to be working. Your symptoms do not improve. Your symptoms get worse. Get help right away if: You develop: Chest pain. Shortness of breath. Discomfort in your back, arms, or stomach. You have: Trouble speaking. Weakness on one side of your body. Drooping in your face. These symptoms may represent a serious problem that is an emergency. Do not wait to see if the symptoms will go away. Get medical help right away. Call your local emergency services (911 in the [place] Do not drive yourself to the hospital. Summary Sleep apnea  is a condition in which breathing pauses or becomes shallow during sleep. The most common cause is a collapsed or blocked airway. The goal of treatment is to restore normal breathing and to ease symptoms during sleep. This information is not intended to replace advice given to you by your health care provider. Make sure you discuss any questions you have with your health care provider. Document Revised: 04/28/2021 Document Reviewed: 08/28/2020 Elsevier Patient Education  [place].

## 2022-03-09 NOTE — Assessment & Plan Note (Addendum)
-   Patient has symptoms loud snoring, witnessed apnea, waking up gasping for air, daytime sleepiness.  Epworth 10. BMI 40.  Strong suspicion patient could have obstructive sleep apnea, needs home sleep study to evaluate. Discussed risk of untreated sleep apnea including cardiac arrhythmias, pulm HTN, stroke, DM. We briefly reviewed treatment options.  She is open to CPAP treatment if needed. Encouraged patient to work on weight loss efforts and focus on side sleeping position/elevate head of bed. Advised against driving if experiencing excessive daytime sleepiness. Follow-up in 4-6 weeks to review sleep study results and discuss treatment options further

## 2022-03-18 ENCOUNTER — Ambulatory Visit: Payer: Medicare Other

## 2022-03-18 DIAGNOSIS — G4733 Obstructive sleep apnea (adult) (pediatric): Secondary | ICD-10-CM | POA: Diagnosis not present

## 2022-03-18 DIAGNOSIS — R0683 Snoring: Secondary | ICD-10-CM

## 2022-03-24 ENCOUNTER — Telehealth: Payer: Self-pay | Admitting: Pulmonary Disease

## 2022-03-24 DIAGNOSIS — G4733 Obstructive sleep apnea (adult) (pediatric): Secondary | ICD-10-CM

## 2022-03-25 ENCOUNTER — Other Ambulatory Visit: Payer: Self-pay | Admitting: Urology

## 2022-03-25 ENCOUNTER — Other Ambulatory Visit (HOSPITAL_COMMUNITY): Payer: Self-pay | Admitting: Urology

## 2022-03-25 DIAGNOSIS — N281 Cyst of kidney, acquired: Secondary | ICD-10-CM

## 2022-03-25 DIAGNOSIS — N83202 Unspecified ovarian cyst, left side: Secondary | ICD-10-CM

## 2022-03-25 NOTE — Telephone Encounter (Signed)
I called and spoke with the daughter DPR and gave her the recommendations. She was agreeable to the study and I have placed the order. Nothing further needed.

## 2022-03-29 ENCOUNTER — Ambulatory Visit (HOSPITAL_COMMUNITY)
Admission: RE | Admit: 2022-03-29 | Discharge: 2022-03-29 | Disposition: A | Discharge status: Home / Self Care | Payer: Medicare Other | Source: Ambulatory Visit | Attending: Urology | Admitting: Urology

## 2022-03-29 DIAGNOSIS — N281 Cyst of kidney, acquired: Secondary | ICD-10-CM | POA: Insufficient documentation

## 2022-03-29 DIAGNOSIS — N83202 Unspecified ovarian cyst, left side: Secondary | ICD-10-CM | POA: Diagnosis present

## 2022-03-29 MED ORDER — GADOBUTROL 1 MMOL/ML IV SOLN
10.0000 mL | Freq: Once | INTRAVENOUS | Status: AC | PRN
  Administered 2022-03-29: 10 mL via INTRAVENOUS

## 2022-04-04 ENCOUNTER — Telehealth: Payer: Self-pay | Admitting: *Deleted

## 2022-04-04 NOTE — Telephone Encounter (Signed)
Spoke with the patient's daughter regarding the referral to GYN oncology. Patient scheduled a new patient with Dr Berline Lopes on 7/24 at 9 am. Patient given an arrival time of 8:30 am.  Explained to the patient the the doctor will perform a pelvic exam at this visit. Patient given the policy that no visitors under the 16 yrs are a loud in the [place]. Patient given the address/phone number for the clinic and that the center offers free valet service.

## 2022-04-20 ENCOUNTER — Encounter: Payer: Self-pay | Admitting: Gynecologic Oncology

## 2022-04-25 ENCOUNTER — Other Ambulatory Visit: Payer: Self-pay

## 2022-04-25 ENCOUNTER — Ambulatory Visit: Payer: Medicare Other

## 2022-04-25 ENCOUNTER — Encounter: Payer: Self-pay | Admitting: Gynecologic Oncology

## 2022-04-25 ENCOUNTER — Inpatient Hospital Stay (HOSPITAL_BASED_OUTPATIENT_CLINIC_OR_DEPARTMENT_OTHER): Payer: Medicare Other | Admitting: Gynecologic Oncology

## 2022-04-25 ENCOUNTER — Inpatient Hospital Stay: Payer: Medicare Other | Attending: Gynecologic Oncology | Admitting: Gynecologic Oncology

## 2022-04-25 VITALS — BP 142/54 | HR 58 | Temp 97.7°F | Resp 18 | Ht 63.0 in | Wt 222.0 lb

## 2022-04-25 DIAGNOSIS — Z79890 Hormone replacement therapy (postmenopausal): Secondary | ICD-10-CM | POA: Insufficient documentation

## 2022-04-25 DIAGNOSIS — E785 Hyperlipidemia, unspecified: Secondary | ICD-10-CM | POA: Diagnosis not present

## 2022-04-25 DIAGNOSIS — R1900 Intra-abdominal and pelvic swelling, mass and lump, unspecified site: Secondary | ICD-10-CM | POA: Insufficient documentation

## 2022-04-25 DIAGNOSIS — R011 Cardiac murmur, unspecified: Secondary | ICD-10-CM | POA: Insufficient documentation

## 2022-04-25 DIAGNOSIS — Z79899 Other long term (current) drug therapy: Secondary | ICD-10-CM | POA: Insufficient documentation

## 2022-04-25 DIAGNOSIS — R350 Frequency of micturition: Secondary | ICD-10-CM | POA: Insufficient documentation

## 2022-04-25 DIAGNOSIS — G40909 Epilepsy, unspecified, not intractable, without status epilepticus: Secondary | ICD-10-CM | POA: Insufficient documentation

## 2022-04-25 DIAGNOSIS — Z8669 Personal history of other diseases of the nervous system and sense organs: Secondary | ICD-10-CM

## 2022-04-25 DIAGNOSIS — R1031 Right lower quadrant pain: Secondary | ICD-10-CM | POA: Diagnosis not present

## 2022-04-25 DIAGNOSIS — E119 Type 2 diabetes mellitus without complications: Secondary | ICD-10-CM | POA: Diagnosis not present

## 2022-04-25 DIAGNOSIS — M109 Gout, unspecified: Secondary | ICD-10-CM | POA: Diagnosis not present

## 2022-04-25 DIAGNOSIS — N9489 Other specified conditions associated with female genital organs and menstrual cycle: Secondary | ICD-10-CM

## 2022-04-25 DIAGNOSIS — Z7984 Long term (current) use of oral hypoglycemic drugs: Secondary | ICD-10-CM | POA: Insufficient documentation

## 2022-04-25 DIAGNOSIS — M1990 Unspecified osteoarthritis, unspecified site: Secondary | ICD-10-CM | POA: Insufficient documentation

## 2022-04-25 DIAGNOSIS — N950 Postmenopausal bleeding: Secondary | ICD-10-CM | POA: Insufficient documentation

## 2022-04-25 DIAGNOSIS — I10 Essential (primary) hypertension: Secondary | ICD-10-CM | POA: Diagnosis not present

## 2022-04-25 DIAGNOSIS — Z6839 Body mass index (BMI) 39.0-39.9, adult: Secondary | ICD-10-CM | POA: Diagnosis not present

## 2022-04-25 MED ORDER — TRAMADOL HCL 50 MG PO TABS: 50.0000 mg | ORAL_TABLET | Freq: Four times a day (QID) | ORAL | 0 refills | Status: DC | PRN

## 2022-04-25 MED ORDER — SENNOSIDES-DOCUSATE SODIUM 8.6-50 MG PO TABS: 2.0000 | ORAL_TABLET | Freq: Every day | ORAL | 0 refills | Status: DC

## 2022-04-25 NOTE — H&P (View-Only) (Signed)
GYNECOLOGIC ONCOLOGY NEW PATIENT CONSULTATION   Patient Name: Sarah Bishop  Patient Age: 81 y.o. Date of Service: 04/25/2022 Referring Provider: Cyndee Brightly, MD  Primary Care Provider: Margretta Sidle, MD Consulting Provider: Jeral Pinch, MD   Assessment/Plan:  Postmenopausal patient with complex pelvic mass.  I reviewed with the patient and her daughter findings on recent imaging showing a complex pelvic mass.  We looked at the MRI images together.  We discussed differential diagnosis including benign ovarian lesion, pedunculated uterine fibroid, or a borderline tumor/malignancy.  Given imaging characteristics, I favor benign process, but in the setting of its size, appearance, and the patient's age, I recommend surgical excision for diagnosis.  We will plan to obtain tumor markers today.  We discussed that these are not diagnostic but help with preoperative planning.  Patient initially presented with hematuria which I suspect was postmenopausal bleeding.  She had an episode of postmenopausal bleeding back in 2014 and ultimately underwent hysteroscopy with endometrial curetting revealing benign endometrial polyp.  I recommended endometrial biopsy today but the patient was somewhat hesitant to do this.  The other option we discussed was for endometrial sampling on the day of surgery with plan to send this for frozen section.  While biopsy today would allow [place] from a planning standpoint before the day of surgery additional procedures that may need to be performed, I think it is reasonable to evaluate the endometrium on the day of surgery.  Patient strong preference was to do this.  She understands that if endometrial sampling reveals hyperplasia or malignancy, that she will also undergo total hysterectomy, and possible lymph node sampling.  With regard to the adnexal mass, plan will be for robotic approach with bilateral salpingo-oophorectomy.  The abnormal adnexa will be placed in Endo  Catch bag for contained morcellation and sent for frozen pathology.  If benign, then no additional procedures would be indicated.  In the setting of a borderline tumor, we discussed recommendation for +/- hysterectomy, omentectomy, and peritoneal biopsies.  In the event of a malignancy, we discussed additional procedures including lymphadenectomy.   We discussed plan for a dilation and curettage, robotic assisted bilateral salpingo-oophorectomy, possible mini laparotomy, possible total hysterectomy, possible staging including lymph node dissection, possible laparotomy. The risks of surgery were discussed in detail and she understands these to include infection; wound separation; hernia; vaginal cuff separation, injury to adjacent organs such as bowel, bladder, blood vessels, ureters and nerves; bleeding which may require blood transfusion; anesthesia risk; thromboembolic events; possible death; unforeseen complications; possible need for re-exploration; medical complications such as heart attack, stroke, pleural effusion and pneumonia; and, if full lymphadenectomy is performed the risk of lymphedema and lymphocyst. The patient will receive DVT and antibiotic prophylaxis as indicated. She voiced a clear understanding. She had the opportunity to ask questions. Perioperative instructions were reviewed with her. Prescriptions for post-op medications were sent to her pharmacy of choice.  In the setting of her multiple medical comorbidities, will reach out to her primary care provider for preoperative clearance.  A copy of this note was sent to the patient's referring provider.   75 minutes of total time was spent for this patient encounter, including preparation, face-to-face counseling with the patient and coordination of care, and documentation of the encounter.   Jeral Pinch, MD  Division of Gynecologic Oncology  Department of Obstetrics and Gynecology  [facility]   ___________________________________________  Chief Complaint: Chief Complaint  Patient presents with   Adnexal mass  History of Present Illness:  Sarah Bishop is a 81 y.o. y.o. female who is seen in consultation at the request of Dr. Matilde Sprang for an evaluation of a complex pelvic mass, postmenopausal bleeding.  Patient was initially seen for hematuria, with spotting on and off for 4 weeks.  As of today, this has been ongoing for about 2 months.  On average, she has once a week bright red blood either on her pad or when she wipes.  She initially thought that this was hematuria because she is incontinent and would see the blood on the pad along with urine.  Recently, these episodes have decreased.  She denies any associated pelvic cramping or pain.  She has urinary frequency secondary to furosemide, last week was having some cramping when she felt like her bladder was full.  Endorses a good appetite without nausea or emesis.  Reports normal bowel function.  Notes about a month of occasional dull right lower quadrant pain, denies any radiation or associated symptoms.  Does not need to take medications for this.  She was for to urology.  She underwent cystoscopy which was normal.  Urinalysis was negative for blood.  CT scan was performed which demonstrated possible renal cell carcinoma versus hyperdense cyst of the right kidney.  An adnexal mass was also noted.  Given these findings, MRI of the abdomen and pelvis was ordered.  03/29/22: MRI of the abdomen and pelvis reveals a left adnexal/ovarian mass measuring 5.5 x 2.8 x 3.7 cm with solid diffuse enhancement and T2 signal characteristics and intermediate to accentuated T1 signal characteristics.  No significant cystic component or ascites.  Renal lesion of the right kidney lower pole is thought to represent a benign lesion.  1.3 cm cystic lesion of the head of the pancreas is recommended to be follow-up with pancreatic MRI in 2 years.  Patient's  GYN history is notable for postmenopausal bleeding in 2014 with ultrasound findings supporting likely endometrial polyp.  She was taken for hysteroscopy in 12/2012 with resection of what appeared to be endometrial polyps as well as submucosal fibroids.  Final pathology revealed endometrial type polyp.  There was no atypia but glands were noted to be crowded.  Comments on pathology report was that this could represent a hyperplastic type polyp but did not represent true endometrial hyperplasia.  Patient lives with her daughter, daughter's husband, and granddaughter.  Secondary to history of 2 knee replacements as well as arthritis and neuropathy in her feet as well as leg swelling, the patient uses a walker for ambulation.  She has type 2 diabetes controlled with oral medications.  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  She has not had any seizures after menopause (last seizure was in the 1990s) but has continued on her antiseizure regimen.  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[place])     PAST SURGICAL HISTORY:  Past Surgical History:  Procedure  Laterality Date   BREAST BIOPSY Left    BREAST BIOPSY Left    DILATION AND CURETTAGE OF UTERUS     DILITATION & CURRETTAGE/HYSTROSCOPY WITH VERSAPOINT RESECTION N/A 12/26/2012   Procedure: DILATATION & CURETTAGE/HYSTEROSCOPY WITH VERSAPOINT RESECTION;  Surgeon: Princess Bruins, MD;  Location: [facility];  Service: Gynecology;   Laterality: N/A;   JOINT REPLACEMENT     MANDIBLE FRACTURE SURGERY     TOTAL KNEE ARTHROPLASTY Bilateral     OB/GYN HISTORY:  OB History  Gravida Para Term Preterm AB Living  '5 4       4  '$ SAB IAB Ectopic Multiple Live Births               # Outcome Date GA Lbr Len/2nd Weight Sex Delivery Anes PTL Lv  5 Gravida           4 Para           3 Para           2 Para           1 Para             No LMP recorded. Patient is postmenopausal.  Age at menarche: 29 Age at menopause: 20 Hx of HRT: Denies Hx of STDs: Denies Last pap: Approximately 2001 per patient report History of abnormal pap smears: Denies  SCREENING STUDIES:  Last mammogram: 2019  Last colonoscopy: Approximately 10 years ago per patient's report  MEDICATIONS: Outpatient Encounter Medications as of 04/25/2022  Medication Sig   allopurinol (ZYLOPRIM) 300 MG tablet Take 300 mg by mouth at bedtime.   amLODipine (NORVASC) 5 MG tablet Take 5 mg by mouth at bedtime.   atenolol (TENORMIN) 50 MG tablet Take 100 mg by mouth at bedtime.   atorvastatin (LIPITOR) 80 MG tablet Take 80 mg by mouth every other day. At night   cholecalciferol (VITAMIN D3) 25 MCG (1000 UNIT) tablet Take 2,000 Units by mouth at bedtime.   citalopram (CELEXA) 10 MG tablet Take 10 mg by mouth in the morning.   divalproex (DEPAKOTE) 250 MG DR tablet Take 750 mg by mouth in the morning.   furosemide (LASIX) 20 MG tablet Take 40 mg by mouth in the morning.   glipiZIDE (GLUCOTROL) 5 MG tablet Take 5 mg by mouth in the morning.   Lancets (ONETOUCH DELICA PLUS [id number]) MISC Apply topically daily.   levothyroxine (SYNTHROID) 50 MCG tablet Take 50 mcg by mouth daily before breakfast.   senna-docusate (SENOKOT-S) 8.6-50 MG tablet Take 2 tablets by mouth at bedtime. For AFTER surgery, do not take if having diarrhea   traMADol (ULTRAM) 50 MG tablet Take 1 tablet (50 mg total) by mouth every 6 (six) hours as needed for severe pain. For AFTER surgery only, do  not take and drive   [DISCONTINUED] acetaminophen-codeine (TYLENOL #3) 300-30 MG per tablet Take 1 tablet by mouth every 4 (four) hours as needed for pain.   [DISCONTINUED] amLODipine (NORVASC) 10 MG tablet Take 10 mg by mouth daily.   [DISCONTINUED] aspirin 325 MG tablet Take 325 mg by mouth daily. (Patient not taking: Reported on 03/09/2022)   [DISCONTINUED] HYDROcodone-acetaminophen (VICODIN) 5-500 MG per tablet Take 1 tablet by mouth every 6 (six) hours as needed for pain. (Patient not taking: Reported on 03/09/2022)   [DISCONTINUED] losartan (COZAAR) 100 MG tablet Take 100 mg by mouth daily.   [DISCONTINUED] Multiple Vitamins-Minerals (MULTIVITAMIN WITH MINERALS) tablet Take 1 tablet by mouth daily. (Patient not taking: Reported  on 03/09/2022)   [DISCONTINUED] Vitamin D, Ergocalciferol, (DRISDOL) 50000 UNITS CAPS Take 50,000 Units by mouth every 7 (seven) days. Takes on Monday's (Patient not taking: Reported on 03/09/2022)   No facility-administered encounter medications on file as of 04/25/2022.    ALLERGIES:  Allergies  Allergen Reactions   Augmentin [Amoxicillin-Pot Clavulanate] Other (See Comments)    Unsure of reaction.   Ivp Dye [Iodinated Contrast Media] Nausea And Vomiting     FAMILY HISTORY:  Family History  Problem Relation Age of Onset   Heart disease Maternal Grandmother    Heart disease Maternal Grandfather    Stroke Maternal Grandfather    Colon cancer Maternal Grandfather    Breast cancer Neg Hx    Ovarian cancer Neg Hx    Endometrial cancer Neg Hx    Pancreatic cancer Neg Hx    Prostate cancer Neg Hx      SOCIAL HISTORY:  Social Connections: Not on file    REVIEW OF SYSTEMS:  + Fatigue, hearing loss, leg swelling, urinary frequency, blood in urine, incontinence, joint pain, itching, rash, dizziness, problem walking, numbness, bruising/bleeding easily, decreased concentration. Denies appetite changes, fevers, chills, unexplained weight changes. Denies  neck lumps  or masses, mouth sores, ringing in ears or voice changes. Denies cough or wheezing.   Denies chest pain or palpitations. Denies leg swelling. Denies abdominal distention, pain, blood in stools, constipation, diarrhea, nausea, vomiting, or early satiety. Denies pain with intercourse, dysuria. Denies hot flashes, pelvic pain, vaginal bleeding or vaginal discharge.   Denies back pain or muscle pain/cramps. Denies rash, or wounds. Denies headaches. Denies swollen lymph nodes or glands. Denies anxiety, depression, confusion.  Physical Exam:  Vital Signs for this encounter:  Blood pressure (!) 142/54, pulse (!) 58, temperature 97.7 F (36.5 C), resp. rate 18, height '5\' 3"'$  (1.6 m), weight 222 lb (100.7 kg). Body mass index is 39.33 kg/m. General: Alert, oriented, no acute distress.  HEENT: Normocephalic, atraumatic. Sclera anicteric.  Chest: Clear to auscultation bilaterally. No wheezes, rhonchi, or rales. Cardiovascular: Regular rate and rhythm, no murmurs, rubs, or gallops.  Abdomen: Obese. Normoactive bowel sounds. Soft, nondistended, nontender to palpation. No masses or hepatosplenomegaly appreciated. No palpable fluid wave.  Extremities: Grossly normal range of motion. Warm, well perfused. 1-2+ edema bilaterally in lower legs. Skin: No rashes or lesions.  Lymphatics: No cervical, supraclavicular, or inguinal adenopathy.  GU:  Normal external female genitalia.  No lesions. No discharge or bleeding.             Bladder/urethra:  No lesions or masses.             Vagina: Mildly atrophic vaginal mucosa, no lesions noted.             Cervix: Normal appearing, no lesions.             Uterus: On bimanual exam, mobile uterus, some fullness appreciated although not palpated within the cul-de-sac, moves in conjunction with the uterus.  No nodularity appreciated.             Adnexa: See above.  Rectal: Deferred.  LABORATORY AND RADIOLOGIC DATA:  Outside medical records were reviewed to  synthesize the above history, along with the history and physical obtained during the visit.   Lab Results  Component Value Date   WBC 8.6 12/25/2012   HGB 11.5 (L) 12/25/2012   HCT 35.4 (L) 12/25/2012   PLT 191 12/25/2012   GLUCOSE 148 (H) 12/25/2012   ALT 31 05/13/2008  AST 35 05/13/2008   NA 140 12/25/2012   K 4.4 12/25/2012   CL 100 12/25/2012   CREATININE 1.09 12/25/2012   BUN 28 (H) 12/25/2012   CO2 25 12/25/2012   INR 1.0 05/13/2008

## 2022-04-25 NOTE — Patient Instructions (Signed)
Preparing for your Surgery  Plan for surgery on May 10, 2022 with Dr. Jeral Pinch at [facility] will be scheduled for hysteroscopy (looking into the uterus with a camera) with sampling from the lining of the uterus, robotic assisted laparoscopic bilateral salpingo-oophorectomy (removal of both ovaries and fallopian tubes), possible robotic assisted total hysterectomy (removal of the uterus and cervix), possible staging if a cancer is identified.   Pre-operative Testing -You will receive a phone call from presurgical testing at [facility]   to arrange for a pre-operative appointment and lab work.  -Bring your insurance card, copy of an advanced directive if applicable, medication list  -At that visit, you will be asked to sign a consent for a possible blood transfusion in case a transfusion becomes necessary during surgery.  The need for a blood transfusion is rare but having consent is a necessary part of your care.     -You should not be taking blood thinners or aspirin at least ten days prior to surgery unless instructed by your surgeon.  -Do not take supplements such as fish oil (omega 3), red yeast rice, turmeric before your surgery. You want to avoid medications with aspirin in them including headache powders such as BC or Goody's), Excedrin migraine.  Day Before Surgery at [place] will be asked to take in a light diet the day before surgery. You will be advised you can have clear liquids up until 3 hours before your surgery.    Eat a light diet the day before surgery.  Examples including soups, broths, toast, yogurt, mashed potatoes.  AVOID GAS PRODUCING FOODS. Things to avoid include carbonated beverages (fizzy beverages, sodas), raw fruits and raw vegetables (uncooked), or beans.   If your bowels are filled with gas, your surgeon will have difficulty visualizing your pelvic organs which increases your surgical risks.  Your role in recovery Your role is  to become active as soon as directed by your doctor, while still giving yourself time to heal.  Rest when you feel tired. You will be asked to do the following in order to speed your recovery:  - Cough and breathe deeply. This helps to clear and expand your lungs and can prevent pneumonia after surgery.  - [place]. Do mild physical activity. Walking or moving your legs help your circulation and body functions return to normal. Do not try to get up or walk alone the first time after surgery.   -If you develop swelling on one leg or the other, pain in the back of your leg, redness/warmth in one of your legs, please call the office or go to the Emergency Room to have a doppler to rule out a blood clot. For shortness of breath, chest pain-seek care in the Emergency Room as soon as possible. - Actively manage your pain. Managing your pain lets you move in comfort. We will ask you to rate your pain on a scale of zero to 10. It is your responsibility to tell your doctor or nurse where and how much you hurt so your pain can be treated.  Special Considerations -If you are diabetic, you may be placed on insulin after surgery to have closer control over your blood sugars to promote healing and recovery.  This does not mean that you will be discharged on insulin.  If applicable, your oral antidiabetics will be resumed when you are tolerating a solid diet.  -Your final pathology results from surgery should  be available around one week after surgery and the results will be relayed to you when available.  -Dr. Lahoma Crocker is the surgeon that assists your GYN Oncologist with surgery.  If you end up staying the night, the next day after your surgery you will either see Dr. Berline Lopes or Dr. Lahoma Crocker.  -FMLA forms can be faxed to [phone] and please allow 5-7 business days for completion.  Pain Management After Surgery -You have been prescribed your pain medication and bowel  regimen medications before surgery so that you can have these available when you are discharged from the hospital. The pain medication is for use ONLY AFTER surgery and a new prescription will not be given.   -Make sure that you have Tylenol and Ibuprofen IF YOU ARE ABLE TO TAKE THESE MEDICATIONS at home to use on a regular basis after surgery for pain control. We recommend alternating the medications every hour to six hours since they work differently and are processed in the body differently for pain relief.  -Review the attached handout on narcotic use and their risks and side effects.   Bowel Regimen -You have been prescribed Sennakot-S to take nightly to prevent constipation especially if you are taking the narcotic pain medication intermittently.  It is important to prevent constipation and drink adequate amounts of liquids. You can stop taking this medication when you are not taking pain medication and you are back on your normal bowel routine.  Risks of Surgery Risks of surgery are low but include bleeding, infection, damage to surrounding structures, re-operation, blood clots, and very rarely death.   Blood Transfusion Information (For the consent to be signed before surgery)  We will be checking your blood type before surgery so in case of emergencies, we will know what type of blood you would need.                                            WHAT IS A BLOOD TRANSFUSION?  A transfusion is the replacement of blood or some of its parts. Blood is made up of multiple cells which provide different functions. Red blood cells carry oxygen and are used for blood loss replacement. White blood cells fight against infection. Platelets control bleeding. Plasma helps clot blood. Other blood products are available for specialized needs, such as hemophilia or other clotting disorders. BEFORE THE TRANSFUSION  Who gives blood for transfusions?  You may be able to donate blood to be used at a later  date on yourself (autologous donation). Relatives can be asked to donate blood. This is generally not any safer than if you have received blood from a stranger. The same precautions are taken to ensure safety when a relative's blood is donated. Healthy volunteers who are fully evaluated to make sure their blood is safe. This is blood bank blood. Transfusion therapy is the safest it has ever been in the practice of medicine. Before blood is taken from a donor, a complete history is taken to make sure that person has no history of diseases nor engages in risky social behavior (examples are intravenous drug use or sexual activity with multiple partners). The donor's travel history is screened to minimize risk of transmitting infections, such as malaria. The donated blood is tested for signs of infectious diseases, such as HIV and hepatitis. The blood is then tested to be sure it is  compatible with you in order to minimize the chance of a transfusion reaction. If you or a relative donates blood, this is often done in anticipation of surgery and is not appropriate for emergency situations. It takes many days to process the donated blood. RISKS AND COMPLICATIONS Although transfusion therapy is very safe and saves many lives, the main dangers of transfusion include:  Getting an infectious disease. Developing a transfusion reaction. This is an allergic reaction to something in the blood you were given. Every precaution is taken to prevent this. The decision to have a blood transfusion has been considered carefully by your caregiver before blood is given. Blood is not given unless the benefits outweigh the risks.  AFTER SURGERY INSTRUCTIONS  Return to work: 4-6 weeks if applicable  Activity: 1. Be up and out of the bed during the day.  Take a nap if needed.  You may walk up steps but be careful and use the hand rail.  Stair climbing will tire you more than you think, you may need to stop part way and rest.    2. No lifting or straining for 6 weeks over 10 pounds. No pushing, pulling, straining for 6 weeks.  3. No driving for around 1 week(s).  Do not drive if you are taking narcotic pain medicine and make sure that your reaction time has returned.   4. You can shower as soon as the next day after surgery. Shower daily.  Use your regular soap and water (not directly on the incision) and pat your incision(s) dry afterwards; don't rub.  No tub baths or submerging your body in water until cleared by your surgeon. If you have the soap that was given to you by pre-surgical testing that was used before surgery, you do not need to use it afterwards because this can irritate your incisions.   5. No sexual activity and nothing in the vagina for 4 weeks, 8 weeks if you have a hysterectomy.  6. You may experience a small amount of clear drainage from your incisions, which is normal.  If the drainage persists, increases, or changes color please call the office.  7. Do not use creams, lotions, or ointments such as neosporin on your incisions after surgery until advised by your surgeon because they can cause removal of the dermabond glue on your incisions.    8. You may experience vaginal spotting after surgery or around the 6-8 week mark from surgery when the stitches at the top of the vagina begin to dissolve if you have a hysterectomy.  The spotting is normal but if you experience heavy bleeding, call our office.  9. Take Tylenol or ibuprofen first for pain if you are able to take these medications and only use narcotic pain medication for severe pain not relieved by the Tylenol or Ibuprofen.  Monitor your Tylenol intake to a max of 4,000 mg in a 24 hour period. You can alternate these medications after surgery.  Diet: 1. Low sodium Heart Healthy Diet is recommended but you are cleared to resume your normal (before surgery) diet after your procedure.  2. It is safe to use a laxative, such as Miralax or Colace,  if you have difficulty moving your bowels. You have been prescribed Sennakot-S to take at bedtime every evening after surgery to keep bowel movements regular and to prevent constipation.    Wound Care: 1. Keep clean and dry.  Shower daily.  Reasons to call the Doctor: Fever - Oral temperature greater than  100.4 degrees Fahrenheit Foul-smelling vaginal discharge Difficulty urinating Nausea and vomiting Increased pain at the site of the incision that is unrelieved with pain medicine. Difficulty breathing with or without chest pain New calf pain especially if only on one side Sudden, continuing increased vaginal bleeding with or without clots.   Contacts: For questions or concerns you should contact:  Dr. Jeral Pinch at [phone]  [name], NP at [phone]  After Hours: call [phone] and have the GYN Oncologist paged/contacted (after 5 pm or on the weekends).  Messages sent via mychart are for non-urgent matters and are not responded to after hours so for urgent needs, please call the after hours number.

## 2022-04-25 NOTE — Progress Notes (Addendum)
Patient here for new patient consultation with Dr. Jeral Pinch with her daughter and for a pre-operative discussion prior to her scheduled surgery on May 10, 2022. She is scheduled for hysteroscopy with sampling from the lining of the uterus, robotic assisted laparoscopic bilateral salpingo-oophorectomy, possible robotic assisted total hysterectomy, possible staging if a cancer is identified. The surgery was discussed in detail.  See after visit summary for additional details. Visual aids used to discuss items related to surgery including sequential compression stockings, foley catheter, IV pump, multi-modal pain regimen including tylenol, photo of the surgical robot, female reproductive system to discuss surgery in detail.      Discussed post-op pain management in detail including the aspects of the enhanced recovery pathway.  Advised her that a new prescription would be sent in for tramadol and it is only to be used for after her upcoming surgery.  We discussed the use of tylenol post-op and to monitor for a maximum of 4,000 mg in a 24 hour period.  Also prescribed sennakot to be used after surgery and to hold if having loose stools.  Discussed bowel regimen in detail.     Discussed the use of SCDs and measures to take at home to prevent DVT including frequent mobility.  Reportable signs and symptoms of DVT discussed. Post-operative instructions discussed and expectations for after surgery. Incisional care discussed as well including reportable signs and symptoms including erythema, drainage, wound separation.     5 minutes spent with the patient.  Verbalizing understanding of material discussed. No needs or concerns voiced at the end of the visit.   Advised patient and family to call for any needs.  Advised that her post-operative medications had been prescribed and could be picked up at any time.    This appointment is included in the global surgical bundle as pre-operative teaching and has no  charge.

## 2022-04-25 NOTE — Progress Notes (Unsigned)
GYNECOLOGIC ONCOLOGY NEW PATIENT CONSULTATION   Patient Name: Sarah Bishop  Patient Age: 81 y.o. Date of Service: 04/25/2022 Referring Provider: Cyndee Brightly, MD  Primary Care Provider: Margretta Sidle, MD Consulting Provider: Jeral Pinch, MD   Assessment/Plan:  Postmenopausal patient with complex pelvic mass.  I reviewed with the patient and her daughter findings on recent imaging showing a complex pelvic mass.  We looked at the MRI images together.  We discussed differential diagnosis including benign ovarian lesion, pedunculated uterine fibroid, or a borderline tumor/malignancy.  Given imaging characteristics, I favor benign process, but in the setting of its size, appearance, and the patient's age, I recommend surgical excision for diagnosis.  We will plan to obtain tumor markers today.  We discussed that these are not diagnostic but help with preoperative planning.  Patient initially presented with hematuria which I suspect was postmenopausal bleeding.  She had an episode of postmenopausal bleeding back in 2014 and ultimately underwent hysteroscopy with endometrial curetting revealing benign endometrial polyp.  I recommended endometrial biopsy today but the patient was somewhat hesitant to do this.  The other option we discussed was for endometrial sampling on the day of surgery with plan to send this for frozen section.  While biopsy today would allow [place] from a planning standpoint before the day of surgery additional procedures that may need to be performed, I think it is reasonable to evaluate the endometrium on the day of surgery.  Patient strong preference was to do this.  She understands that if endometrial sampling reveals hyperplasia or malignancy, that she will also undergo total hysterectomy, and possible lymph node sampling.  With regard to the adnexal mass, plan will be for robotic approach with bilateral salpingo-oophorectomy.  The abnormal adnexa will be placed in Endo  Catch bag for contained morcellation and sent for frozen pathology.  If benign, then no additional procedures would be indicated.  In the setting of a borderline tumor, we discussed recommendation for +/- hysterectomy, omentectomy, and peritoneal biopsies.  In the event of a malignancy, we discussed additional procedures including lymphadenectomy.   We discussed plan for a dilation and curettage, robotic assisted bilateral salpingo-oophorectomy, possible mini laparotomy, possible total hysterectomy, possible staging including lymph node dissection, possible laparotomy. The risks of surgery were discussed in detail and she understands these to include infection; wound separation; hernia; vaginal cuff separation, injury to adjacent organs such as bowel, bladder, blood vessels, ureters and nerves; bleeding which may require blood transfusion; anesthesia risk; thromboembolic events; possible death; unforeseen complications; possible need for re-exploration; medical complications such as heart attack, stroke, pleural effusion and pneumonia; and, if full lymphadenectomy is performed the risk of lymphedema and lymphocyst. The patient will receive DVT and antibiotic prophylaxis as indicated. She voiced a clear understanding. She had the opportunity to ask questions. Perioperative instructions were reviewed with her. Prescriptions for post-op medications were sent to her pharmacy of choice.  In the setting of her multiple medical comorbidities, will reach out to her primary care provider for preoperative clearance.  A copy of this note was sent to the patient's referring provider.   75 minutes of total time was spent for this patient encounter, including preparation, face-to-face counseling with the patient and coordination of care, and documentation of the encounter.   Jeral Pinch, MD  Division of Gynecologic Oncology  Department of Obstetrics and Gynecology  [facility]   ___________________________________________  Chief Complaint: Chief Complaint  Patient presents with   Adnexal mass  History of Present Illness:  Sarah Bishop is a 81 y.o. y.o. female who is seen in consultation at the request of Dr. Matilde Sprang for an evaluation of a complex pelvic mass, postmenopausal bleeding.  Patient was initially seen for hematuria, with spotting on and off for 4 weeks.  As of today, this has been ongoing for about 2 months.  On average, she has once a week bright red blood either on her pad or when she wipes.  She initially thought that this was hematuria because she is incontinent and would see the blood on the pad along with urine.  Recently, these episodes have decreased.  She denies any associated pelvic cramping or pain.  She has urinary frequency secondary to furosemide, last week was having some cramping when she felt like her bladder was full.  Endorses a good appetite without nausea or emesis.  Reports normal bowel function.  Notes about a month of occasional dull right lower quadrant pain, denies any radiation or associated symptoms.  Does not need to take medications for this.  She was for to urology.  She underwent cystoscopy which was normal.  Urinalysis was negative for blood.  CT scan was performed which demonstrated possible renal cell carcinoma versus hyperdense cyst of the right kidney.  An adnexal mass was also noted.  Given these findings, MRI of the abdomen and pelvis was ordered.  03/29/22: MRI of the abdomen and pelvis reveals a left adnexal/ovarian mass measuring 5.5 x 2.8 x 3.7 cm with solid diffuse enhancement and T2 signal characteristics and intermediate to accentuated T1 signal characteristics.  No significant cystic component or ascites.  Renal lesion of the right kidney lower pole is thought to represent a benign lesion.  1.3 cm cystic lesion of the head of the pancreas is recommended to be follow-up with pancreatic MRI in 2 years.  Patient's  GYN history is notable for postmenopausal bleeding in 2014 with ultrasound findings supporting likely endometrial polyp.  She was taken for hysteroscopy in 12/2012 with resection of what appeared to be endometrial polyps as well as submucosal fibroids.  Final pathology revealed endometrial type polyp.  There was no atypia but glands were noted to be crowded.  Comments on pathology report was that this could represent a hyperplastic type polyp but did not represent true endometrial hyperplasia.  Patient lives with her daughter, daughter's husband, and granddaughter.  Secondary to history of 2 knee replacements as well as arthritis and neuropathy in her feet as well as leg swelling, the patient uses a walker for ambulation.  She has type 2 diabetes controlled with oral medications.  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  She has not had any seizures after menopause (last seizure was in the 1990s) but has continued on her antiseizure regimen.  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[place])     PAST SURGICAL HISTORY:  Past Surgical History:  Procedure  Laterality Date   BREAST BIOPSY Left    BREAST BIOPSY Left    DILATION AND CURETTAGE OF UTERUS     DILITATION & CURRETTAGE/HYSTROSCOPY WITH VERSAPOINT RESECTION N/A 12/26/2012   Procedure: DILATATION & CURETTAGE/HYSTEROSCOPY WITH VERSAPOINT RESECTION;  Surgeon: Princess Bruins, MD;  Location: [facility];  Service: Gynecology;   Laterality: N/A;   JOINT REPLACEMENT     MANDIBLE FRACTURE SURGERY     TOTAL KNEE ARTHROPLASTY Bilateral     OB/GYN HISTORY:  OB History  Gravida Para Term Preterm AB Living  '5 4       4  '$ SAB IAB Ectopic Multiple Live Births               # Outcome Date GA Lbr Len/2nd Weight Sex Delivery Anes PTL Lv  5 Gravida           4 Para           3 Para           2 Para           1 Para             No LMP recorded. Patient is postmenopausal.  Age at menarche: 61 Age at menopause: 70 Hx of HRT: Denies Hx of STDs: Denies Last pap: Approximately 2001 per patient report History of abnormal pap smears: Denies  SCREENING STUDIES:  Last mammogram: 2019  Last colonoscopy: Approximately 10 years ago per patient's report  MEDICATIONS: Outpatient Encounter Medications as of 04/25/2022  Medication Sig   allopurinol (ZYLOPRIM) 300 MG tablet Take 300 mg by mouth at bedtime.   amLODipine (NORVASC) 5 MG tablet Take 5 mg by mouth at bedtime.   atenolol (TENORMIN) 50 MG tablet Take 100 mg by mouth at bedtime.   atorvastatin (LIPITOR) 80 MG tablet Take 80 mg by mouth every other day. At night   cholecalciferol (VITAMIN D3) 25 MCG (1000 UNIT) tablet Take 2,000 Units by mouth at bedtime.   citalopram (CELEXA) 10 MG tablet Take 10 mg by mouth in the morning.   divalproex (DEPAKOTE) 250 MG DR tablet Take 750 mg by mouth in the morning.   furosemide (LASIX) 20 MG tablet Take 40 mg by mouth in the morning.   glipiZIDE (GLUCOTROL) 5 MG tablet Take 5 mg by mouth in the morning.   Lancets (ONETOUCH DELICA PLUS [id number]) MISC Apply topically daily.   levothyroxine (SYNTHROID) 50 MCG tablet Take 50 mcg by mouth daily before breakfast.   senna-docusate (SENOKOT-S) 8.6-50 MG tablet Take 2 tablets by mouth at bedtime. For AFTER surgery, do not take if having diarrhea   traMADol (ULTRAM) 50 MG tablet Take 1 tablet (50 mg total) by mouth every 6 (six) hours as needed for severe pain. For AFTER surgery only, do  not take and drive   [DISCONTINUED] acetaminophen-codeine (TYLENOL #3) 300-30 MG per tablet Take 1 tablet by mouth every 4 (four) hours as needed for pain.   [DISCONTINUED] amLODipine (NORVASC) 10 MG tablet Take 10 mg by mouth daily.   [DISCONTINUED] aspirin 325 MG tablet Take 325 mg by mouth daily. (Patient not taking: Reported on 03/09/2022)   [DISCONTINUED] HYDROcodone-acetaminophen (VICODIN) 5-500 MG per tablet Take 1 tablet by mouth every 6 (six) hours as needed for pain. (Patient not taking: Reported on 03/09/2022)   [DISCONTINUED] losartan (COZAAR) 100 MG tablet Take 100 mg by mouth daily.   [DISCONTINUED] Multiple Vitamins-Minerals (MULTIVITAMIN WITH MINERALS) tablet Take 1 tablet by mouth daily. (Patient not taking: Reported  on 03/09/2022)   [DISCONTINUED] Vitamin D, Ergocalciferol, (DRISDOL) 50000 UNITS CAPS Take 50,000 Units by mouth every 7 (seven) days. Takes on Monday's (Patient not taking: Reported on 03/09/2022)   No facility-administered encounter medications on file as of 04/25/2022.    ALLERGIES:  Allergies  Allergen Reactions   Augmentin [Amoxicillin-Pot Clavulanate] Other (See Comments)    Unsure of reaction.   Ivp Dye [Iodinated Contrast Media] Nausea And Vomiting     FAMILY HISTORY:  Family History  Problem Relation Age of Onset   Heart disease Maternal Grandmother    Heart disease Maternal Grandfather    Stroke Maternal Grandfather    Colon cancer Maternal Grandfather    Breast cancer Neg Hx    Ovarian cancer Neg Hx    Endometrial cancer Neg Hx    Pancreatic cancer Neg Hx    Prostate cancer Neg Hx      SOCIAL HISTORY:  Social Connections: Not on file    REVIEW OF SYSTEMS:  + Fatigue, hearing loss, leg swelling, urinary frequency, blood in urine, incontinence, joint pain, itching, rash, dizziness, problem walking, numbness, bruising/bleeding easily, decreased concentration. Denies appetite changes, fevers, chills, unexplained weight changes. Denies  neck lumps  or masses, mouth sores, ringing in ears or voice changes. Denies cough or wheezing.   Denies chest pain or palpitations. Denies leg swelling. Denies abdominal distention, pain, blood in stools, constipation, diarrhea, nausea, vomiting, or early satiety. Denies pain with intercourse, dysuria. Denies hot flashes, pelvic pain, vaginal bleeding or vaginal discharge.   Denies back pain or muscle pain/cramps. Denies rash, or wounds. Denies headaches. Denies swollen lymph nodes or glands. Denies anxiety, depression, confusion.  Physical Exam:  Vital Signs for this encounter:  Blood pressure (!) 142/54, pulse (!) 58, temperature 97.7 F (36.5 C), resp. rate 18, height '5\' 3"'$  (1.6 m), weight 222 lb (100.7 kg). Body mass index is 39.33 kg/m. General: Alert, oriented, no acute distress.  HEENT: Normocephalic, atraumatic. Sclera anicteric.  Chest: Clear to auscultation bilaterally. No wheezes, rhonchi, or rales. Cardiovascular: Regular rate and rhythm, no murmurs, rubs, or gallops.  Abdomen: Obese. Normoactive bowel sounds. Soft, nondistended, nontender to palpation. No masses or hepatosplenomegaly appreciated. No palpable fluid wave.  Extremities: Grossly normal range of motion. Warm, well perfused. 1-2+ edema bilaterally in lower legs. Skin: No rashes or lesions.  Lymphatics: No cervical, supraclavicular, or inguinal adenopathy.  GU:  Normal external female genitalia.  No lesions. No discharge or bleeding.             Bladder/urethra:  No lesions or masses.             Vagina: Mildly atrophic vaginal mucosa, no lesions noted.             Cervix: Normal appearing, no lesions.             Uterus: On bimanual exam, mobile uterus, some fullness appreciated although not palpated within the cul-de-sac, moves in conjunction with the uterus.  No nodularity appreciated.             Adnexa: See above.  Rectal: Deferred.  LABORATORY AND RADIOLOGIC DATA:  Outside medical records were reviewed to  synthesize the above history, along with the history and physical obtained during the visit.   Lab Results  Component Value Date   WBC 8.6 12/25/2012   HGB 11.5 (L) 12/25/2012   HCT 35.4 (L) 12/25/2012   PLT 191 12/25/2012   GLUCOSE 148 (H) 12/25/2012   ALT 31 05/13/2008  AST 35 05/13/2008   NA 140 12/25/2012   K 4.4 12/25/2012   CL 100 12/25/2012   CREATININE 1.09 12/25/2012   BUN 28 (H) 12/25/2012   CO2 25 12/25/2012   INR 1.0 05/13/2008

## 2022-04-25 NOTE — Patient Instructions (Signed)
Preparing for your Surgery   Plan for surgery on May 10, 2022 with Dr. Jeral Pinch at [facility] will be scheduled for hysteroscopy (looking into the uterus with a camera) with sampling from the lining of the uterus, robotic assisted laparoscopic bilateral salpingo-oophorectomy (removal of both ovaries and fallopian tubes), possible robotic assisted total hysterectomy (removal of the uterus and cervix), possible staging if a cancer is identified.    Pre-operative Testing -You will receive a phone call from presurgical testing at [facility]   to arrange for a pre-operative appointment and lab work.   -Bring your insurance card, copy of an advanced directive if applicable, medication list   -At that visit, you will be asked to sign a consent for a possible blood transfusion in case a transfusion becomes necessary during surgery.  The need for a blood transfusion is rare but having consent is a necessary part of your care.      -You should not be taking blood thinners or aspirin at least ten days prior to surgery unless instructed by your surgeon.   -Do not take supplements such as fish oil (omega 3), red yeast rice, turmeric before your surgery. You want to avoid medications with aspirin in them including headache powders such as BC or Goody's), Excedrin migraine.   Day Before Surgery at [place] will be asked to take in a light diet the day before surgery. You will be advised you can have clear liquids up until 3 hours before your surgery.     Eat a light diet the day before surgery.  Examples including soups, broths, toast, yogurt, mashed potatoes.  AVOID GAS PRODUCING FOODS. Things to avoid include carbonated beverages (fizzy beverages, sodas), raw fruits and raw vegetables (uncooked), or beans.    If your bowels are filled with gas, your surgeon will have difficulty visualizing your pelvic organs which increases your surgical risks.   Your role in  recovery Your role is to become active as soon as directed by your doctor, while still giving yourself time to heal.  Rest when you feel tired. You will be asked to do the following in order to speed your recovery:   - Cough and breathe deeply. This helps to clear and expand your lungs and can prevent pneumonia after surgery.  - [place]. Do mild physical activity. Walking or moving your legs help your circulation and body functions return to normal. Do not try to get up or walk alone the first time after surgery.   -If you develop swelling on one leg or the other, pain in the back of your leg, redness/warmth in one of your legs, please call the office or go to the Emergency Room to have a doppler to rule out a blood clot. For shortness of breath, chest pain-seek care in the Emergency Room as soon as possible. - Actively manage your pain. Managing your pain lets you move in comfort. We will ask you to rate your pain on a scale of zero to 10. It is your responsibility to tell your doctor or nurse where and how much you hurt so your pain can be treated.   Special Considerations -If you are diabetic, you may be placed on insulin after surgery to have closer control over your blood sugars to promote healing and recovery.  This does not mean that you will be discharged on insulin.  If applicable, your oral antidiabetics will be resumed when you are  tolerating a solid diet.   -Your final pathology results from surgery should be available around one week after surgery and the results will be relayed to you when available.   -Dr. Lahoma Crocker is the surgeon that assists your GYN Oncologist with surgery.  If you end up staying the night, the next day after your surgery you will either see Dr. Berline Lopes or Dr. Lahoma Crocker.   -FMLA forms can be faxed to [phone] and please allow 5-7 business days for completion.   Pain Management After Surgery -You have been prescribed your  pain medication and bowel regimen medications before surgery so that you can have these available when you are discharged from the hospital. The pain medication is for use ONLY AFTER surgery and a new prescription will not be given.    -Make sure that you have Tylenol and Ibuprofen IF YOU ARE ABLE TO TAKE THESE MEDICATIONS at home to use on a regular basis after surgery for pain control. We recommend alternating the medications every hour to six hours since they work differently and are processed in the body differently for pain relief.   -Review the attached handout on narcotic use and their risks and side effects.    Bowel Regimen -You have been prescribed Sennakot-S to take nightly to prevent constipation especially if you are taking the narcotic pain medication intermittently.  It is important to prevent constipation and drink adequate amounts of liquids. You can stop taking this medication when you are not taking pain medication and you are back on your normal bowel routine.   Risks of Surgery Risks of surgery are low but include bleeding, infection, damage to surrounding structures, re-operation, blood clots, and very rarely death.     Blood Transfusion Information (For the consent to be signed before surgery)   We will be checking your blood type before surgery so in case of emergencies, we will know what type of blood you would need.                                             WHAT IS A BLOOD TRANSFUSION?   A transfusion is the replacement of blood or some of its parts. Blood is made up of multiple cells which provide different functions. Red blood cells carry oxygen and are used for blood loss replacement. White blood cells fight against infection. Platelets control bleeding. Plasma helps clot blood. Other blood products are available for specialized needs, such as hemophilia or other clotting disorders. BEFORE THE TRANSFUSION  Who gives blood for transfusions?  You may be able to  donate blood to be used at a later date on yourself (autologous donation). Relatives can be asked to donate blood. This is generally not any safer than if you have received blood from a stranger. The same precautions are taken to ensure safety when a relative's blood is donated. Healthy volunteers who are fully evaluated to make sure their blood is safe. This is blood bank blood. Transfusion therapy is the safest it has ever been in the practice of medicine. Before blood is taken from a donor, a complete history is taken to make sure that person has no history of diseases nor engages in risky social behavior (examples are intravenous drug use or sexual activity with multiple partners). The donor's travel history is screened to minimize risk of transmitting infections, such as malaria.  The donated blood is tested for signs of infectious diseases, such as HIV and hepatitis. The blood is then tested to be sure it is compatible with you in order to minimize the chance of a transfusion reaction. If you or a relative donates blood, this is often done in anticipation of surgery and is not appropriate for emergency situations. It takes many days to process the donated blood. RISKS AND COMPLICATIONS Although transfusion therapy is very safe and saves many lives, the main dangers of transfusion include:  Getting an infectious disease. Developing a transfusion reaction. This is an allergic reaction to something in the blood you were given. Every precaution is taken to prevent this. The decision to have a blood transfusion has been considered carefully by your caregiver before blood is given. Blood is not given unless the benefits outweigh the risks.   AFTER SURGERY INSTRUCTIONS   Return to work: 4-6 weeks if applicable   Activity: 1. Be up and out of the bed during the day.  Take a nap if needed.  You may walk up steps but be careful and use the hand rail.  Stair climbing will tire you more than you think, you  may need to stop part way and rest.    2. No lifting or straining for 6 weeks over 10 pounds. No pushing, pulling, straining for 6 weeks.   3. No driving for around 1 week(s).  Do not drive if you are taking narcotic pain medicine and make sure that your reaction time has returned.    4. You can shower as soon as the next day after surgery. Shower daily.  Use your regular soap and water (not directly on the incision) and pat your incision(s) dry afterwards; don't rub.  No tub baths or submerging your body in water until cleared by your surgeon. If you have the soap that was given to you by pre-surgical testing that was used before surgery, you do not need to use it afterwards because this can irritate your incisions.    5. No sexual activity and nothing in the vagina for 4 weeks, 8 weeks if you have a hysterectomy.   6. You may experience a small amount of clear drainage from your incisions, which is normal.  If the drainage persists, increases, or changes color please call the office.   7. Do not use creams, lotions, or ointments such as neosporin on your incisions after surgery until advised by your surgeon because they can cause removal of the dermabond glue on your incisions.     8. You may experience vaginal spotting after surgery or around the 6-8 week mark from surgery when the stitches at the top of the vagina begin to dissolve if you have a hysterectomy.  The spotting is normal but if you experience heavy bleeding, call our office.   9. Take Tylenol or ibuprofen first for pain if you are able to take these medications and only use narcotic pain medication for severe pain not relieved by the Tylenol or Ibuprofen.  Monitor your Tylenol intake to a max of 4,000 mg in a 24 hour period. You can alternate these medications after surgery.   Diet: 1. Low sodium Heart Healthy Diet is recommended but you are cleared to resume your normal (before surgery) diet after your procedure.   2. It is safe  to use a laxative, such as Miralax or Colace, if you have difficulty moving your bowels. You have been prescribed Sennakot-S to take at bedtime  every evening after surgery to keep bowel movements regular and to prevent constipation.     Wound Care: 1. Keep clean and dry.  Shower daily.   Reasons to call the Doctor: Fever - Oral temperature greater than 100.4 degrees Fahrenheit Foul-smelling vaginal discharge Difficulty urinating Nausea and vomiting Increased pain at the site of the incision that is unrelieved with pain medicine. Difficulty breathing with or without chest pain New calf pain especially if only on one side Sudden, continuing increased vaginal bleeding with or without clots.   Contacts: For questions or concerns you should contact:   Dr. Jeral Pinch at [phone]   [name], NP at [phone]   After Hours: call [phone] and have the GYN Oncologist paged/contacted (after 5 pm or on the weekends).   Messages sent via mychart are for non-urgent matters and are not responded to after hours so for urgent needs, please call the after hours number.

## 2022-04-26 ENCOUNTER — Telehealth: Payer: Self-pay | Admitting: *Deleted

## 2022-04-26 DIAGNOSIS — N9489 Other specified conditions associated with female genital organs and menstrual cycle: Secondary | ICD-10-CM | POA: Insufficient documentation

## 2022-04-26 NOTE — Telephone Encounter (Signed)
Per Dr Berline Lopes fax office note and surgical optimization form to the patient's PCP

## 2022-04-27 ENCOUNTER — Other Ambulatory Visit (HOSPITAL_COMMUNITY): Payer: Medicare Other

## 2022-04-29 NOTE — Progress Notes (Signed)
Anesthesia Review:  PCP: Cardiologist : Chest x-ray : EKG : Echo : Stress test: Cardiac Cath :  Activity level:  Sleep Study/ CPAP : Fasting Blood Sugar :      / Checks Blood Sugar -- times a day:   Blood Thinner/ Instructions /Last Dose: ASA / Instructions/ Last Dose :   DM- type  Hgba1c-  

## 2022-04-29 NOTE — Patient Instructions (Addendum)
SURGICAL WAITING ROOM VISITATION Patients having surgery or a procedure may have no more than 2 support people in the waiting area - these visitors may rotate.   Children under the age of 75 must have an adult with them who is not the patient. If the patient needs to stay at the hospital during part of their recovery, the visitor guidelines for inpatient rooms apply. Pre-op nurse will coordinate an appropriate time for 1 support person to accompany patient in pre-op.  This support person may not rotate.    Please refer to the [facility] website for the visitor guidelines for Inpatients (after your surgery is over and you are in a regular room).       Your procedure is scheduled on:  05/10/2022.    Report to [facility] Main Entrance    Report to admitting at   0830am    Call this number if you have problems the morning of surgery [phone]   Do not eat food :After Midnight. Eat a light diet the day before surgery.  Avoid gas producing foods.    After Midnight you may have the following liquids until __ 0730____ AM  DAY OF SURGERY  Water Non-Citrus Juices (without pulp, NO RED) Carbonated Beverages Black Coffee (NO MILK/CREAM OR CREAMERS, sugar ok)  Clear Tea (NO MILK/CREAM OR CREAMERS, sugar ok) regular and decaf                             Plain Jell-O (NO RED)                                           Fruit ices (not with fruit pulp, NO RED)                                     Popsicles (NO RED)                                                               Sports drinks like Gatorade (NO RED)                        Oral Hygiene is also important to reduce your risk of infection.                                    Remember - BRUSH YOUR TEETH THE MORNING OF SURGERY WITH YOUR REGULAR TOOTHPASTE   Do NOT smoke after Midnight   Take these medicines the morning of surgery with A SIP OF WATER:  celexa, depakote, synthroid   DO NOT TAKE ANY ORAL DIABETIC MEDICATIONS  DAY OF YOUR SURGERY  Bring CPAP mask and tubing day of surgery.                              You may not have any metal on your body including hair pins, jewelry, and body piercing  Do not wear make-up, lotions, powders, perfumes/cologne, or deodorant  Do not wear nail polish including gel and S&S, artificial/acrylic nails, or any other type of covering on natural nails including finger and toenails. If you have artificial nails, gel coating, etc. that needs to be removed by a nail salon please have this removed prior to surgery or surgery may need to be canceled/ delayed if the surgeon/ anesthesia feels like they are unable to be safely monitored.   Do not shave  48 hours prior to surgery.               Men may shave face and neck.   Do not bring valuables to the hospital. [place].   Contacts, dentures or bridgework may not be worn into surgery.   Bring small overnight bag day of surgery.   DO NOT [place]. PHARMACY WILL DISPENSE MEDICATIONS LISTED ON YOUR MEDICATION LIST TO YOU DURING YOUR ADMISSION [place]!    Patients discharged on the day of surgery will not be allowed to drive home.  Someone NEEDS to stay with you for the first 24 hours after anesthesia.   Special Instructions: Bring a copy of your healthcare power of attorney and living will documents         the day of surgery if you haven't scanned them before.              Please read over the following fact sheets you were given: IF YOU HAVE QUESTIONS ABOUT YOUR PRE-OP INSTRUCTIONS PLEASE CALL [phone]     [facility] - Preparing for Surgery Before surgery, you can play an important role.  Because skin is not sterile, your skin needs to be as free of germs as possible.  You can reduce the number of germs on your skin by washing with CHG (chlorahexidine gluconate) soap before surgery.  CHG is an antiseptic cleaner  which kills germs and bonds with the skin to continue killing germs even after washing. Please DO NOT use if you have an allergy to CHG or antibacterial soaps.  If your skin becomes reddened/irritated stop using the CHG and inform your nurse when you arrive at Short Stay. Do not shave (including legs and underarms) for at least 48 hours prior to the first CHG shower.  You may shave your face/neck. Please follow these instructions carefully:  1.  Shower with CHG Soap the night before surgery and the  morning of Surgery.  2.  If you choose to wash your hair, wash your hair first as usual with your  normal  shampoo.  3.  After you shampoo, rinse your hair and body thoroughly to remove the  shampoo.                           4.  Use CHG as you would any other liquid soap.  You can apply chg directly  to the skin and wash                       Gently with a scrungie or clean washcloth.  5.  Apply the CHG Soap to your body ONLY FROM THE NECK DOWN.   Do not use on face/ open  Wound or open sores. Avoid contact with eyes, ears mouth and genitals (private parts).                       Wash face,  Genitals (private parts) with your normal soap.             6.  Wash thoroughly, paying special attention to the area where your surgery  will be performed.  7.  Thoroughly rinse your body with warm water from the neck down.  8.  DO NOT shower/wash with your normal soap after using and rinsing off  the CHG Soap.                9.  Pat yourself dry with a clean towel.            10.  Wear clean pajamas.            11.  Place clean sheets on your bed the night of your first shower and do not  sleep with pets. Day of Surgery : Do not apply any lotions/deodorants the morning of surgery.  Please wear clean clothes to the hospital/surgery center.  FAILURE TO FOLLOW THESE INSTRUCTIONS MAY RESULT IN THE CANCELLATION OF YOUR SURGERY PATIENT SIGNATURE_________________________________  NURSE  SIGNATURE__________________________________  ________________________________________________________________________

## 2022-05-01 ENCOUNTER — Ambulatory Visit (HOSPITAL_BASED_OUTPATIENT_CLINIC_OR_DEPARTMENT_OTHER): Payer: Medicare Other | Attending: Pulmonary Disease | Admitting: Pulmonary Disease

## 2022-05-01 DIAGNOSIS — G4733 Obstructive sleep apnea (adult) (pediatric): Secondary | ICD-10-CM | POA: Insufficient documentation

## 2022-05-01 DIAGNOSIS — E119 Type 2 diabetes mellitus without complications: Secondary | ICD-10-CM | POA: Diagnosis not present

## 2022-05-01 DIAGNOSIS — I10 Essential (primary) hypertension: Secondary | ICD-10-CM | POA: Insufficient documentation

## 2022-05-02 ENCOUNTER — Encounter (HOSPITAL_COMMUNITY): Payer: Self-pay

## 2022-05-02 ENCOUNTER — Telehealth: Payer: Self-pay

## 2022-05-02 ENCOUNTER — Encounter (HOSPITAL_COMMUNITY)
Admission: RE | Admit: 2022-05-02 | Discharge: 2022-05-02 | Disposition: A | Discharge status: Home / Self Care | Payer: Medicare Other | Source: Ambulatory Visit | Attending: Gynecologic Oncology | Admitting: Gynecologic Oncology

## 2022-05-02 DIAGNOSIS — Z01818 Encounter for other preprocedural examination: Secondary | ICD-10-CM | POA: Insufficient documentation

## 2022-05-02 DIAGNOSIS — N950 Postmenopausal bleeding: Secondary | ICD-10-CM | POA: Insufficient documentation

## 2022-05-02 DIAGNOSIS — N9489 Other specified conditions associated with female genital organs and menstrual cycle: Secondary | ICD-10-CM | POA: Insufficient documentation

## 2022-05-02 HISTORY — DX: Hypothyroidism, unspecified: E03.9

## 2022-05-02 HISTORY — DX: Dyspnea, unspecified: R06.00

## 2022-05-02 HISTORY — DX: Peripheral vascular disease, unspecified: I73.9

## 2022-05-02 HISTORY — DX: Sleep apnea, unspecified: G47.30

## 2022-05-02 LAB — CBC
HCT: 36.4 % (ref 36.0–46.0)
Hemoglobin: 11.8 g/dL — ABNORMAL LOW (ref 12.0–15.0)
MCH: 31.9 pg (ref 26.0–34.0)
MCHC: 32.4 g/dL (ref 30.0–36.0)
MCV: 98.4 fL (ref 80.0–100.0)
Platelets: 233 10*3/uL (ref 150–400)
RBC: 3.7 MIL/uL — ABNORMAL LOW (ref 3.87–5.11)
RDW: 14.7 % (ref 11.5–15.5)
WBC: 9.5 10*3/uL (ref 4.0–10.5)
nRBC: 0 % (ref 0.0–0.2)

## 2022-05-02 LAB — COMPREHENSIVE METABOLIC PANEL
ALT: 13 U/L (ref 0–44)
AST: 16 U/L (ref 15–41)
Albumin: 3.6 g/dL (ref 3.5–5.0)
Alkaline Phosphatase: 61 U/L (ref 38–126)
Anion gap: 10 (ref 5–15)
BUN: 47 mg/dL — ABNORMAL HIGH (ref 8–23)
CO2: 27 mmol/L (ref 22–32)
Calcium: 9.6 mg/dL (ref 8.9–10.3)
Chloride: 104 mmol/L (ref 98–111)
Creatinine, Ser: 1.5 mg/dL — ABNORMAL HIGH (ref 0.44–1.00)
GFR, Estimated: 35 mL/min — ABNORMAL LOW (ref >60)
Glucose, Bld: 95 mg/dL (ref 70–99)
Potassium: 4.4 mmol/L (ref 3.5–5.1)
Sodium: 141 mmol/L (ref 135–145)
Total Bilirubin: 0.4 mg/dL (ref 0.3–1.2)
Total Protein: 7 g/dL (ref 6.5–8.1)

## 2022-05-02 LAB — HEMOGLOBIN A1C
Hgb A1c MFr Bld: 6.4 % — ABNORMAL HIGH (ref 4.8–5.6)
Mean Plasma Glucose: 136.98 mg/dL

## 2022-05-02 LAB — GLUCOSE, CAPILLARY: Glucose-Capillary: 127 mg/dL — ABNORMAL HIGH (ref 70–99)

## 2022-05-02 NOTE — Telephone Encounter (Addendum)
Spoke with Sarah Bishop this morning and reviewed her CMP results. Per NP, BUN (47) and creatinine (1.50) are elevated. Sarah Bishop needs to drink plenty of fluids and avoid NSAIDs. Advised Sarah Bishop she needs to follow up with her PCP. Results have been successfully faxed to PCP (Dr. Reece Levy). We will also get recent labs from Dr. Reddy's office to compare to. Sarah Bishop verbalized understanding and did not voice any questions.

## 2022-05-03 ENCOUNTER — Telehealth: Payer: Self-pay

## 2022-05-03 LAB — CA 125: Cancer Antigen (CA) 125: 69 U/mL — ABNORMAL HIGH (ref 0.0–38.1)

## 2022-05-03 LAB — CEA: CEA: 1.5 ng/mL (ref 0.0–4.7)

## 2022-05-03 NOTE — Telephone Encounter (Signed)
Following up on after hours triage call. Patient called in stating she filled her prescription for senokot, the instructions state to take 2 tablets at bedtime, there are 14 tablets enclosed, she wants to know if she needs to start them today and every day until surgery.  Called and spoke with patients daughter [name]. Instructed that the senokot is for after surgery. It is to help with her bowels, if she has loose stool she can hold taking it. Patients daughter verbalized understanding and will notify her mom.

## 2022-05-03 NOTE — Progress Notes (Signed)
Pre-op testing

## 2022-05-09 ENCOUNTER — Telehealth: Payer: Self-pay | Admitting: *Deleted

## 2022-05-09 ENCOUNTER — Telehealth: Payer: Self-pay | Admitting: Pulmonary Disease

## 2022-05-09 DIAGNOSIS — G4733 Obstructive sleep apnea (adult) (pediatric): Secondary | ICD-10-CM

## 2022-05-09 NOTE — Procedures (Signed)
POLYSOMNOGRAPHY  Last, First: Jovi, Alvizo MRN: [id number] Gender: Female Age (years): 81 Weight (lbs): 220 DOB: 1941/09/05 BMI: 39 Primary Care: No PCP Epworth Score: <br> Referring: Laurin Coder MD Technician: Gwenyth Allegra Interpreting: Laurin Coder MD Study Type: CPAP Ordered Study Type: CPAP Study date: 05/01/2022 Location: [facility] CLINICAL INFORMATION Sarah Bishop is a 81 year old Female and was referred to the sleep center for evaluation of G47.33 OSA: Adult and Pediatric (327.23). Indications include Diabetes (249.00), Hypertension (401.9).  MEDICATIONS Patient self administered medications include: N/A. Medications administered during study include No sleep medicine administered.  SLEEP STUDY TECHNIQUE The patient underwent an attended overnight polysomnography titration to assess the effects of CPAP therapy. The following variables were monitored: EEG(C4-A1, C3-A2, O1-A2, O2-A1), EOG, submental and leg EMG, ECG, oxyhemoglobin saturation by pulse oximetry, thoracic and abdominal respiratory effort belts, nasal/oral airflow by pressure sensor, body position sensor and snoring sensor. CPAP pressure was titrated to eliminate apneas, hypopneas and oxygen desaturation. Hypopneas were scored per AASM definition IB (4% desaturation)  TECHNICIAN COMMENTS Comments added by Technician: Patient sleep in a recliner chair. Patients apnea was worse in REM Comments added by Scorer: N/A SLEEP ARCHITECTURE The study was initiated at 11:03:18 PM and terminated at 5:14:15 AM. Total recorded time was 371 minutes. EEG confirmed total sleep time was 157.6 minutes yielding a sleep efficiency of 42.5%%. Sleep onset after lights out was 87.4 minutes with a REM latency of 249.0 minutes. The patient spent 12.7%% of the night in stage N1 sleep, 69.9%% in stage N2 sleep, 0.0%% in stage N3 and 17.5% in REM. The Arousal Index was 12.2/hour. RESPIRATORY PARAMETERS The overall AHI was 2.3 per  hour, and the RDI was 3.4 events/hour with a central apnea index of 0per hour. The most appropriate setting of CPAP was 13 cm H2O. At this setting, the sleep efficiency was 100% and the patient was supine for 100%. The AHI was 0 events per hour, and the RDI was 0 events/hour (with 0 central events) and the arousal index was 0 per hour.The oxygen nadir was 95.0% during sleep.    The cumulative time under 88% oxygen saturation was 5.5 minutes  LEG MOVEMENT DATA The total leg movements were 0 with a resulting leg movement index of 0.0/hr. Associated arousal with leg movement index was 0.0/hr. CARDIAC DATA The underlying cardiac rhythm was most consistent with sinus rhythm. Mean heart rate during sleep was 49.5 bpm. Additional rhythm abnormalities include None.  IMPRESSIONS - EKG showed no cardiac abnormalities. - The patient snored with soft snoring volume. - Mild Oxygen Desaturation - No Significant Obstructive Sleep apnea(OSA) Optimal pressure attained. - No Significant Upper Airway Resistance Syndrome(UARS). - No significant periodic leg movements(PLMs) during sleep. However, no significant associated arousals. [place] reduced sleep efficiency, long primary sleep latency, long REM sleep latency and long slow wave latency.  DIAGNOSIS - Severe Obstructive Sleep Apnea (G47.33)  RECOMMENDATIONS - Trial of CPAP therapy on 13 cm H2O with a Small size Fisher&Paykel Full Face Vitera mask and heated humidification. - Avoid alcohol, sedatives and other CNS depressants that may worsen sleep apnea and disrupt normal sleep architecture. - Sleep hygiene should be reviewed to assess factors that may improve sleep quality. - Weight management and regular exercise should be initiated or continued. - Return to Sleep Center for re-evaluation after 4 weeks of therapy  [Electronically signed] 05/09/2022 05:50 AM  Sherrilyn Rist MD NPI: [id number]

## 2022-05-09 NOTE — Telephone Encounter (Signed)
Call patient  Sleep study result  Date of study: 05/01/2022  Impression: Severe obstructive sleep apnea  Recommendation: DME referral  Recommend CPAP therapy for severe obstructive sleep apnea  - Trial of CPAP therapy on 13 cm H2O with a Small size Fisher&Paykel Full Face Vitera mask and heated humidification.  Encourage weight loss measures  Follow-up in the office 4 to 6 weeks following initiation of treatment

## 2022-05-09 NOTE — Telephone Encounter (Signed)
Telephone call to check on pre-operative status.  Spoke with pt's daughter [name].  Patient compliant with pre-operative instructions.  Reinforced nothing to eat after midnight. Clear liquids until 0730. Patient to arrive at 0830.  No questions or concerns voiced.  Instructed to call for any needs.  Meds pt can take prior to surgery celexa, depakote, synthroid.

## 2022-05-09 NOTE — Telephone Encounter (Signed)
I called and spoke with the daughter(DPR)  and she is ok with her mom getting a CPAP and mom was also on the the end of the phone as well. She voices understanding. Order has been placed. Nothing further needed.

## 2022-05-10 ENCOUNTER — Encounter (HOSPITAL_COMMUNITY): Payer: Self-pay | Admitting: Gynecologic Oncology

## 2022-05-10 ENCOUNTER — Ambulatory Visit (HOSPITAL_BASED_OUTPATIENT_CLINIC_OR_DEPARTMENT_OTHER): Payer: Medicare Other | Admitting: Certified Registered Nurse Anesthetist

## 2022-05-10 ENCOUNTER — Ambulatory Visit (HOSPITAL_COMMUNITY)
Admission: RE | Admit: 2022-05-10 | Discharge: 2022-05-10 | Disposition: A | Discharge status: Home / Self Care | Payer: Medicare Other | Attending: Gynecologic Oncology | Admitting: Gynecologic Oncology

## 2022-05-10 ENCOUNTER — Other Ambulatory Visit: Payer: Self-pay

## 2022-05-10 ENCOUNTER — Encounter (HOSPITAL_COMMUNITY)
Admission: RE | Disposition: A | Discharge status: Home / Self Care | Payer: Self-pay | Source: Home / Self Care | Attending: Gynecologic Oncology

## 2022-05-10 ENCOUNTER — Ambulatory Visit (HOSPITAL_COMMUNITY): Payer: Medicare Other | Admitting: Physician Assistant

## 2022-05-10 DIAGNOSIS — E039 Hypothyroidism, unspecified: Secondary | ICD-10-CM | POA: Insufficient documentation

## 2022-05-10 DIAGNOSIS — Z87891 Personal history of nicotine dependence: Secondary | ICD-10-CM | POA: Insufficient documentation

## 2022-05-10 DIAGNOSIS — C5700 Malignant neoplasm of unspecified fallopian tube: Secondary | ICD-10-CM | POA: Diagnosis not present

## 2022-05-10 DIAGNOSIS — G4730 Sleep apnea, unspecified: Secondary | ICD-10-CM | POA: Insufficient documentation

## 2022-05-10 DIAGNOSIS — N950 Postmenopausal bleeding: Secondary | ICD-10-CM

## 2022-05-10 DIAGNOSIS — N840 Polyp of corpus uteri: Secondary | ICD-10-CM | POA: Insufficient documentation

## 2022-05-10 DIAGNOSIS — Z79899 Other long term (current) drug therapy: Secondary | ICD-10-CM | POA: Diagnosis not present

## 2022-05-10 DIAGNOSIS — N930 Postcoital and contact bleeding: Secondary | ICD-10-CM

## 2022-05-10 DIAGNOSIS — M1990 Unspecified osteoarthritis, unspecified site: Secondary | ICD-10-CM | POA: Insufficient documentation

## 2022-05-10 DIAGNOSIS — C5702 Malignant neoplasm of left fallopian tube: Secondary | ICD-10-CM | POA: Insufficient documentation

## 2022-05-10 DIAGNOSIS — N8003 Adenomyosis of the uterus: Secondary | ICD-10-CM | POA: Insufficient documentation

## 2022-05-10 DIAGNOSIS — N7011 Chronic salpingitis: Secondary | ICD-10-CM | POA: Insufficient documentation

## 2022-05-10 DIAGNOSIS — I10 Essential (primary) hypertension: Secondary | ICD-10-CM

## 2022-05-10 DIAGNOSIS — N72 Inflammatory disease of cervix uteri: Secondary | ICD-10-CM | POA: Diagnosis not present

## 2022-05-10 DIAGNOSIS — Z6839 Body mass index (BMI) 39.0-39.9, adult: Secondary | ICD-10-CM | POA: Diagnosis not present

## 2022-05-10 DIAGNOSIS — N9489 Other specified conditions associated with female genital organs and menstrual cycle: Secondary | ICD-10-CM

## 2022-05-10 DIAGNOSIS — Z79890 Hormone replacement therapy (postmenopausal): Secondary | ICD-10-CM | POA: Insufficient documentation

## 2022-05-10 DIAGNOSIS — Z01818 Encounter for other preprocedural examination: Secondary | ICD-10-CM

## 2022-05-10 DIAGNOSIS — E119 Type 2 diabetes mellitus without complications: Secondary | ICD-10-CM | POA: Diagnosis not present

## 2022-05-10 DIAGNOSIS — Z7984 Long term (current) use of oral hypoglycemic drugs: Secondary | ICD-10-CM | POA: Insufficient documentation

## 2022-05-10 DIAGNOSIS — D251 Intramural leiomyoma of uterus: Secondary | ICD-10-CM | POA: Insufficient documentation

## 2022-05-10 DIAGNOSIS — R7989 Other specified abnormal findings of blood chemistry: Secondary | ICD-10-CM

## 2022-05-10 HISTORY — PX: ROBOTIC ASSISTED BILATERAL SALPINGO OOPHERECTOMY: SHX6078

## 2022-05-10 LAB — BPAM RBC
Blood Product Expiration Date: 202308232359
Blood Product Expiration Date: 202308232359
Unit Type and Rh: 6200
Unit Type and Rh: 6200

## 2022-05-10 LAB — TYPE AND SCREEN
ABO/RH(D): A POS
Antibody Screen: POSITIVE
PT AG Type: NEGATIVE
Unit division: 0
Unit division: 0

## 2022-05-10 LAB — GLUCOSE, CAPILLARY
Glucose-Capillary: 108 mg/dL — ABNORMAL HIGH (ref 70–99)
Glucose-Capillary: 142 mg/dL — ABNORMAL HIGH (ref 70–99)

## 2022-05-10 SURGERY — SALPINGO-OOPHORECTOMY, BILATERAL, ROBOT-ASSISTED
Anesthesia: General | Site: Abdomen | Laterality: Bilateral

## 2022-05-10 MED ORDER — DEXAMETHASONE SODIUM PHOSPHATE 4 MG/ML IJ SOLN
INTRAMUSCULAR | Status: DC | PRN
  Administered 2022-05-10: 4 mg via INTRAVENOUS

## 2022-05-10 MED ORDER — LACTATED RINGERS IV SOLN: INTRAVENOUS | Status: DC

## 2022-05-10 MED ORDER — BUPIVACAINE HCL 0.25 % IJ SOLN
INTRAMUSCULAR | Status: AC
  Filled 2022-05-10: qty 1

## 2022-05-10 MED ORDER — ROCURONIUM BROMIDE 10 MG/ML (PF) SYRINGE
PREFILLED_SYRINGE | INTRAVENOUS | Status: AC
  Filled 2022-05-10: qty 10

## 2022-05-10 MED ORDER — EPHEDRINE 5 MG/ML INJ
INTRAVENOUS | Status: AC
  Filled 2022-05-10: qty 5

## 2022-05-10 MED ORDER — LIDOCAINE HCL (PF) 2 % IJ SOLN
INTRAMUSCULAR | Status: AC
  Filled 2022-05-10: qty 5

## 2022-05-10 MED ORDER — SUGAMMADEX SODIUM 200 MG/2ML IV SOLN
INTRAVENOUS | Status: DC | PRN
  Administered 2022-05-10: 200 mg via INTRAVENOUS

## 2022-05-10 MED ORDER — ONDANSETRON HCL 4 MG/2ML IJ SOLN
INTRAMUSCULAR | Status: DC | PRN
  Administered 2022-05-10: 4 mg via INTRAVENOUS

## 2022-05-10 MED ORDER — DEXAMETHASONE SODIUM PHOSPHATE 10 MG/ML IJ SOLN
INTRAMUSCULAR | Status: AC
Start: 2022-05-10 — End: ?
  Filled 2022-05-10: qty 1

## 2022-05-10 MED ORDER — OXYCODONE HCL 5 MG PO TABS
ORAL_TABLET | ORAL | Status: AC
  Filled 2022-05-10: qty 1

## 2022-05-10 MED ORDER — ENOXAPARIN (LOVENOX) PATIENT EDUCATION KIT
PACK | Freq: Once | Status: DC
  Filled 2022-05-10: qty 1

## 2022-05-10 MED ORDER — BUPIVACAINE HCL 0.25 % IJ SOLN
INTRAMUSCULAR | Status: DC | PRN
  Administered 2022-05-10: 30 mL

## 2022-05-10 MED ORDER — OXYCODONE HCL 5 MG PO TABS
5.0000 mg | ORAL_TABLET | Freq: Once | ORAL | Status: AC
  Administered 2022-05-10: 5 mg via ORAL

## 2022-05-10 MED ORDER — HEMOSTATIC AGENTS (NO CHARGE) OPTIME
TOPICAL | Status: DC | PRN
  Administered 2022-05-10: 1

## 2022-05-10 MED ORDER — CHLORHEXIDINE GLUCONATE 0.12 % MT SOLN
15.0000 mL | Freq: Once | OROMUCOSAL | Status: AC
  Administered 2022-05-10: 15 mL via OROMUCOSAL

## 2022-05-10 MED ORDER — PROPOFOL 10 MG/ML IV BOLUS
INTRAVENOUS | Status: DC | PRN
  Administered 2022-05-10: 120 mg via INTRAVENOUS
  Administered 2022-05-10: 50 mg via INTRAVENOUS

## 2022-05-10 MED ORDER — LACTATED RINGERS IR SOLN
Status: DC | PRN
  Administered 2022-05-10: 1000 mL

## 2022-05-10 MED ORDER — ORAL CARE MOUTH RINSE: 15.0000 mL | Freq: Once | OROMUCOSAL | Status: AC

## 2022-05-10 MED ORDER — HEPARIN SODIUM (PORCINE) 5000 UNIT/ML IJ SOLN
5000.0000 [IU] | INTRAMUSCULAR | Status: AC
  Administered 2022-05-10: 5000 [IU] via SUBCUTANEOUS
  Filled 2022-05-10: qty 1

## 2022-05-10 MED ORDER — ONDANSETRON HCL 4 MG/2ML IJ SOLN
INTRAMUSCULAR | Status: AC
  Filled 2022-05-10: qty 2

## 2022-05-10 MED ORDER — PHENYLEPHRINE 80 MCG/ML (10ML) SYRINGE FOR IV PUSH (FOR BLOOD PRESSURE SUPPORT)
PREFILLED_SYRINGE | INTRAVENOUS | Status: AC
  Filled 2022-05-10: qty 10

## 2022-05-10 MED ORDER — DEXAMETHASONE SODIUM PHOSPHATE 4 MG/ML IJ SOLN: 4.0000 mg | INTRAMUSCULAR | Status: DC

## 2022-05-10 MED ORDER — ACETAMINOPHEN 500 MG PO TABS
1000.0000 mg | ORAL_TABLET | ORAL | Status: AC
  Administered 2022-05-10: 1000 mg via ORAL
  Filled 2022-05-10: qty 2

## 2022-05-10 MED ORDER — PROPOFOL 10 MG/ML IV BOLUS
INTRAVENOUS | Status: AC
  Filled 2022-05-10: qty 20

## 2022-05-10 MED ORDER — CEFAZOLIN SODIUM 1 G IJ SOLR
INTRAMUSCULAR | Status: AC
  Filled 2022-05-10: qty 20

## 2022-05-10 MED ORDER — HYDROMORPHONE HCL 1 MG/ML IJ SOLN
INTRAMUSCULAR | Status: AC
  Filled 2022-05-10: qty 2

## 2022-05-10 MED ORDER — ENOXAPARIN (LOVENOX) PATIENT EDUCATION KIT: 1.0000 | PACK | Freq: Once | 0 refills | Status: AC

## 2022-05-10 MED ORDER — AMISULPRIDE (ANTIEMETIC) 5 MG/2ML IV SOLN: 10.0000 mg | Freq: Once | INTRAVENOUS | Status: DC | PRN

## 2022-05-10 MED ORDER — FENTANYL CITRATE (PF) 100 MCG/2ML IJ SOLN
INTRAMUSCULAR | Status: AC
  Filled 2022-05-10: qty 2

## 2022-05-10 MED ORDER — HYDROMORPHONE HCL 1 MG/ML IJ SOLN
0.2500 mg | INTRAMUSCULAR | Status: DC | PRN
  Administered 2022-05-10 (×2): 0.5 mg via INTRAVENOUS

## 2022-05-10 MED ORDER — STERILE WATER FOR IRRIGATION IR SOLN
Status: DC | PRN
  Administered 2022-05-10: 1000 mL

## 2022-05-10 MED ORDER — ENOXAPARIN SODIUM 40 MG/0.4ML IJ SOSY: 40.0000 mg | PREFILLED_SYRINGE | INTRAMUSCULAR | 0 refills | Status: DC

## 2022-05-10 MED ORDER — CEFAZOLIN SODIUM-DEXTROSE 2-3 GM-%(50ML) IV SOLR
INTRAVENOUS | Status: DC | PRN
  Administered 2022-05-10: 2 g via INTRAVENOUS

## 2022-05-10 MED ORDER — LIDOCAINE HCL (CARDIAC) PF 100 MG/5ML IV SOSY
PREFILLED_SYRINGE | INTRAVENOUS | Status: DC | PRN
  Administered 2022-05-10: 80 mg via INTRAVENOUS

## 2022-05-10 MED ORDER — PHENYLEPHRINE 80 MCG/ML (10ML) SYRINGE FOR IV PUSH (FOR BLOOD PRESSURE SUPPORT)
PREFILLED_SYRINGE | INTRAVENOUS | Status: DC | PRN
  Administered 2022-05-10 (×2): 80 ug via INTRAVENOUS
  Administered 2022-05-10: 160 ug via INTRAVENOUS
  Administered 2022-05-10: 80 ug via INTRAVENOUS

## 2022-05-10 MED ORDER — FENTANYL CITRATE (PF) 100 MCG/2ML IJ SOLN
INTRAMUSCULAR | Status: DC | PRN
  Administered 2022-05-10: 75 ug via INTRAVENOUS
  Administered 2022-05-10: 50 ug via INTRAVENOUS
  Administered 2022-05-10: 25 ug via INTRAVENOUS
  Administered 2022-05-10: 50 ug via INTRAVENOUS

## 2022-05-10 MED ORDER — EPHEDRINE SULFATE (PRESSORS) 50 MG/ML IJ SOLN
INTRAMUSCULAR | Status: DC | PRN
  Administered 2022-05-10 (×2): 10 mg via INTRAVENOUS

## 2022-05-10 MED ORDER — ROCURONIUM BROMIDE 10 MG/ML (PF) SYRINGE
PREFILLED_SYRINGE | INTRAVENOUS | Status: DC | PRN
  Administered 2022-05-10 (×2): 20 mg via INTRAVENOUS
  Administered 2022-05-10: 60 mg via INTRAVENOUS
  Administered 2022-05-10: 20 mg via INTRAVENOUS

## 2022-05-10 SURGICAL SUPPLY — 95 items
ADH SKN CLS APL DERMABOND .7 (GAUZE/BANDAGES/DRESSINGS) ×2
AGENT HMST KT MTR STRL THRMB (HEMOSTASIS)
APL ESCP 34 STRL LF DISP (HEMOSTASIS)
APL SRG 38 LTWT LNG FL B (MISCELLANEOUS) ×2
APPLICATOR ARISTA FLEXITIP XL (MISCELLANEOUS) ×2 IMPLANT
APPLICATOR SURGIFLO ENDO (HEMOSTASIS) IMPLANT
BAG COUNTER SPONGE SURGICOUNT (BAG) ×4 IMPLANT
BAG LAPAROSCOPIC 12 15 PORT 16 (BASKET) ×1 IMPLANT
BAG RETRIEVAL 12/15 (BASKET) ×3
BAG SPNG CNTER NS LX DISP (BAG) ×2
BIPOLAR CUTTING LOOP 21FR (ELECTRODE)
BLADE SURG SZ10 CARB STEEL (BLADE) IMPLANT
COVER BACK TABLE 60X90IN (DRAPES) ×4 IMPLANT
COVER TIP SHEARS 8 DVNC (MISCELLANEOUS) ×3 IMPLANT
COVER TIP SHEARS 8MM DA VINCI (MISCELLANEOUS) ×3
DERMABOND ADVANCED (GAUZE/BANDAGES/DRESSINGS) ×1
DERMABOND ADVANCED .7 DNX12 (GAUZE/BANDAGES/DRESSINGS) ×3 IMPLANT
DEVICE MYOSURE LITE (MISCELLANEOUS) IMPLANT
DEVICE MYOSURE REACH (MISCELLANEOUS) IMPLANT
DILATOR CANAL MILEX (MISCELLANEOUS) IMPLANT
DRAPE ARM DVNC X/XI (DISPOSABLE) ×12 IMPLANT
DRAPE COLUMN DVNC XI (DISPOSABLE) ×3 IMPLANT
DRAPE DA VINCI XI ARM (DISPOSABLE) ×12
DRAPE DA VINCI XI COLUMN (DISPOSABLE) ×3
DRAPE SHEET LG 3/4 BI-LAMINATE (DRAPES) ×4 IMPLANT
DRAPE SURG IRRIG POUCH 19X23 (DRAPES) ×4 IMPLANT
DRSG OPSITE POSTOP 4X6 (GAUZE/BANDAGES/DRESSINGS) IMPLANT
DRSG OPSITE POSTOP 4X8 (GAUZE/BANDAGES/DRESSINGS) IMPLANT
ELECT PENCIL ROCKER SW 15FT (MISCELLANEOUS) IMPLANT
ELECT REM PT RETURN 15FT ADLT (MISCELLANEOUS) ×4 IMPLANT
GAUZE 4X4 16PLY ~~LOC~~+RFID DBL (SPONGE) ×6 IMPLANT
GLOVE BIO SURGEON STRL SZ 6 (GLOVE) ×8 IMPLANT
GLOVE BIO SURGEON STRL SZ 6.5 (GLOVE) ×4 IMPLANT
GOWN STRL REUS W/ TWL LRG LVL3 (GOWN DISPOSABLE) ×12 IMPLANT
GOWN STRL REUS W/TWL LRG LVL3 (GOWN DISPOSABLE) ×12
HEMOSTAT ARISTA ABSORB 3G PWDR (HEMOSTASIS) ×2 IMPLANT
HOLDER FOLEY CATH W/STRAP (MISCELLANEOUS) IMPLANT
IRRIG SUCT STRYKERFLOW 2 WTIP (MISCELLANEOUS) ×3
IRRIGATION SUCT STRKRFLW 2 WTP (MISCELLANEOUS) ×3 IMPLANT
IV NS IRRIG 3000ML ARTHROMATIC (IV SOLUTION) ×2 IMPLANT
KIT PROCEDURE DA VINCI SI (MISCELLANEOUS)
KIT PROCEDURE DVNC SI (MISCELLANEOUS) IMPLANT
KIT PROCEDURE FLUENT (KITS) IMPLANT
KIT TURNOVER KIT A (KITS) IMPLANT
LIGASURE IMPACT 36 18CM CVD LR (INSTRUMENTS) IMPLANT
LOOP CUTTING BIPOLAR 21FR (ELECTRODE) IMPLANT
MANIPULATOR ADVINCU DEL 3.0 PL (MISCELLANEOUS) IMPLANT
MANIPULATOR ADVINCU DEL 3.5 PL (MISCELLANEOUS) ×2 IMPLANT
MANIPULATOR UTERINE 4.5 ZUMI (MISCELLANEOUS) IMPLANT
MYOSURE XL FIBROID (MISCELLANEOUS)
NDL HYPO 21X1.5 SAFETY (NEEDLE) ×2 IMPLANT
NDL SPNL 18GX3.5 QUINCKE PK (NEEDLE) IMPLANT
NEEDLE HYPO 21X1.5 SAFETY (NEEDLE) ×3 IMPLANT
NEEDLE SPNL 18GX3.5 QUINCKE PK (NEEDLE) IMPLANT
OBTURATOR OPTICAL STANDARD 8MM (TROCAR) ×3
OBTURATOR OPTICAL STND 8 DVNC (TROCAR) ×2
OBTURATOR OPTICALSTD 8 DVNC (TROCAR) ×3 IMPLANT
PACK ROBOT GYN CUSTOM WL (TRAY / TRAY PROCEDURE) ×4 IMPLANT
PACK VAGINAL WOMENS (CUSTOM PROCEDURE TRAY) ×2 IMPLANT
PAD OB MATERNITY 4.3X12.25 (PERSONAL CARE ITEMS) IMPLANT
PAD POSITIONING PINK XL (MISCELLANEOUS) ×4 IMPLANT
PORT ACCESS TROCAR AIRSEAL 12 (TROCAR) ×3 IMPLANT
PORT ACCESS TROCAR AIRSEAL 5M (TROCAR) ×1
SCRUB CHG 4% DYNA-HEX 4OZ (MISCELLANEOUS) ×8 IMPLANT
SEAL CANN UNIV 5-8 DVNC XI (MISCELLANEOUS) ×12 IMPLANT
SEAL ROD LENS SCOPE MYOSURE (ABLATOR) IMPLANT
SEAL XI 5MM-8MM UNIVERSAL (MISCELLANEOUS) ×12
SET TRI-LUMEN FLTR TB AIRSEAL (TUBING) ×4 IMPLANT
SPIKE FLUID TRANSFER (MISCELLANEOUS) ×4 IMPLANT
SPONGE T-LAP 18X18 ~~LOC~~+RFID (SPONGE) IMPLANT
SURGIFLO W/THROMBIN 8M KIT (HEMOSTASIS) IMPLANT
SUT MNCRL AB 4-0 PS2 18 (SUTURE) IMPLANT
SUT PDS AB 1 TP1 96 (SUTURE) IMPLANT
SUT VIC AB 0 CT1 27 (SUTURE)
SUT VIC AB 0 CT1 27XBRD ANTBC (SUTURE) IMPLANT
SUT VIC AB 2-0 CT1 27 (SUTURE)
SUT VIC AB 2-0 CT1 TAPERPNT 27 (SUTURE) IMPLANT
SUT VIC AB 4-0 PS2 18 (SUTURE) ×8 IMPLANT
SUT VLOC 180 0 9IN  GS21 (SUTURE) ×3
SUT VLOC 180 0 9IN GS21 (SUTURE) ×1 IMPLANT
SYR 10ML LL (SYRINGE) IMPLANT
SYS BAG RETRIEVAL 10MM (BASKET) ×3
SYS WOUND ALEXIS 18CM MED (MISCELLANEOUS)
SYSTEM BAG RETRIEVAL 10MM (BASKET) ×1 IMPLANT
SYSTEM TISS REMOVAL MYOSURE XL (MISCELLANEOUS) IMPLANT
SYSTEM WOUND ALEXIS 18CM MED (MISCELLANEOUS) IMPLANT
TOWEL OR 17X26 10 PK STRL BLUE (TOWEL DISPOSABLE) ×4 IMPLANT
TOWEL OR NON WOVEN STRL DISP B (DISPOSABLE) IMPLANT
TRAP SPECIMEN MUCUS 40CC (MISCELLANEOUS) ×2 IMPLANT
TRAY FOLEY MTR SLVR 16FR STAT (SET/KITS/TRAYS/PACK) ×4 IMPLANT
TROCAR Z-THREAD FIOS 5X100MM (TROCAR) IMPLANT
UNDERPAD 30X36 HEAVY ABSORB (UNDERPADS AND DIAPERS) ×8 IMPLANT
WATER STERILE IRR 1000ML POUR (IV SOLUTION) ×4 IMPLANT
WATER STERILE IRR 500ML POUR (IV SOLUTION) ×4 IMPLANT
YANKAUER SUCT BULB TIP 10FT TU (MISCELLANEOUS) IMPLANT

## 2022-05-10 NOTE — Anesthesia Procedure Notes (Signed)
Procedure Name: Intubation Date/Time: 05/10/2022 11:18 AM  Performed by: Justice Rocher, CRNAPre-anesthesia Checklist: Patient identified, Emergency Drugs available, Suction available, Patient being monitored and Timeout performed Patient Re-evaluated:Patient Re-evaluated prior to induction Oxygen Delivery Method: Circle system utilized Preoxygenation: Pre-oxygenation with 100% oxygen Induction Type: IV induction Ventilation: Mask ventilation without difficulty Laryngoscope Size: Mac and 3 Grade View: Grade II Tube type: Oral Tube size: 7.0 mm Number of attempts: 1 Airway Equipment and Method: Stylet and Oral airway Placement Confirmation: ETT inserted through vocal cords under direct vision, positive ETCO2, breath sounds checked- equal and bilateral and CO2 detector Secured at: 23 cm Tube secured with: Tape Dental Injury: Teeth and Oropharynx as per pre-operative assessment  Comments: Teeth poor preop

## 2022-05-10 NOTE — Op Note (Signed)
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   Anesthesia: GET  Urine Output: 250 cc  Operative Findings:  On EUA, mildly enlarged, somewhat mobile uterus. Fullness appreciated within the left cul de sac. On intra-abdominal entry, normal upper abdominal survey including liver edge, diaphragm, and stomach. Normal appearing omentum. No ascites. Normal appearing small bowel. Significant inflammation within the pelvis concerning initially for recent diverticulitis versus endometriosis. Thickened and inflamed pelvic peritoneum. Filmy adhesions between the anterior cul de sac and uterus, between the right adnexa and and the sigmoid colon. Right adnexa inflammed but otherwise normal in appearing. Left fallopian tube dilated with evidence of hydrosalpinx. Friable adhesions of the fallopian tube to the sigmoid mesentery, left ovary and left broad ligament. No ascites. Uterus bulbous and 10cm with findings of endosalpingiosis versus endometriosis on much of the uterine serosa. Dense adhesions between the bladder and cervix. Cul de sac without evidence of disease. No ascites.  Cystoscopy findings: bladder dome intact, normal efflux from bilateral ureteral orifices. On frozen section, no abnormality of the endometrium. Anterior cul de sac adhesion with findings of hemosiderin, no definitive malignancy. Fallopian tube with malignancy.  Estimated Blood Loss:  125 cc       Total IV Fluids: see I&O flowsheet         Specimens: uterus, cervix, bilateral tubes and ovaries, anterior cul de sac adhesion, pelvic washings         Complications:  None apparent; patient tolerated the procedure well.         Disposition: PACU - hemodynamically stable.  Procedure Details  The patient was seen in the Holding Room. The risks, benefits, complications, treatment options, and expected outcomes were discussed with the patient.  The patient concurred with the proposed plan, giving informed consent.  The site of surgery properly noted/marked. The patient was identified as Sarah Bishop and the procedure verified as a Robotic-assisted bilateral salpingo oophorectomy with any other indicated procedures.   After induction of anesthesia, the patient was draped and prepped in the usual sterile manner. Patient was placed in supine position after anesthesia and draped and prepped in the usual sterile manner as follows: Her arms were tucked to her side with all appropriate precautions.  The shoulders were stabilized with padded shoulder blocks applied to the acromium processes.  The patient was placed in the semi-lithotomy position in [place].  The perineum and vagina were prepped with CholoraPrep. The patient was draped after the CholoraPrep had been allowed to dry for 3 minutes.  A Time Out was held and the above information confirmed.  The urethra was prepped with Betadine. Foley catheter was placed.  A sterile speculum was placed in the vagina.  The cervix was grasped with a single-tooth tenaculum. The cervix was dilated with Kennon Rounds dilators.  The ZUMI uterine manipulator with a medium colpotomizer ring was placed without difficulty.  A pneum occluder balloon was placed over the manipulator.  OG tube placement was confirmed and to suction.   Next, a ***10 mm skin incision was made 1 cm below the subcostal margin in the  midclavicular line.  The 5 mm Optiview port and scope was used  for direct entry.  Opening pressure was under 10 mm CO2.  The abdomen was insufflated and the findings were noted as above.   At this point and all points during the procedure, the patient's intra-abdominal pressure did not exceed 15 mmHg. Next, an 8 mm skin incision was made *** the umbilicus and a right and left port were placed about 8 cm lateral to the robot port on the right and left side.  ***A fourth arm was placed on the ***right.  The 5 mm assist trocar was exchanged for a 10-12 mm port. All ports were placed under direct visualization.  The patient was placed in steep Trendelenburg.  Bowel was folded away into the upper abdomen.  The robot was docked in the normal manner.  The right and left peritoneum were opened parallel to the IP ligament to open the retroperitoneal spaces bilaterally. The round ligaments were transected. The ureter was noted to be on the medial leaf of the broad ligament.  The peritoneum above the ureter was incised and stretched and the infundibulopelvic ligament was skeletonized, cauterized and cut.    The posterior peritoneum was taken down to the level of the KOH ring.  The anterior peritoneum was also taken down.  The bladder flap was created to the level of the KOH ring.  The uterine artery on the right side was skeletonized, cauterized and cut in the normal manner.  A similar procedure was performed on the left.  The colpotomy was made and the uterus, cervix, bilateral ovaries and tubes were amputated and delivered through the vagina.  Pedicles were inspected and excellent hemostasis was achieved.    Fallopian tube detached and placed in endocatch bag. Fallopian tube detached Dense adeshions of the bladder Closed with viryl at corners, 0 V-Loc cystoscopy  The colpotomy at the vaginal cuff was closed with Vicryl on a CT1 needle in ***running manner.  Irrigation was used and excellent hemostasis was achieved.  At this point in the procedure was completed.  Robotic  instruments were removed under direct visulaization.  The robot was undocked. The fascia at the 10-12 mm port was closed with 0 Vicryl on a UR-5 needle.  The subcuticular tissue was closed with 4-0 Vicryl and the skin was closed with 4-0 Monocryl in a subcuticular manner.  Dermabond was applied.    The vagina was swabbed with  minimal bleeding noted.   All sponge, lap and needle counts were correct x  3.   The patient was transferred to the recovery room in *** condition.  Jeral Pinch, MD

## 2022-05-10 NOTE — Interval H&P Note (Signed)
History and Physical Interval Note:  05/10/2022 10:34 AM  Sarah Bishop  has presented today for surgery, with the diagnosis of ADNEXAL MASS, POST MENOPAUSAL BLEEDING.  The various methods of treatment have been discussed with the patient and family. After consideration of risks, benefits and other options for treatment, the patient has consented to  Procedure(s): DILATATION & CURETTAGE/HYSTEROSCOPY WITH POSSIBLE MYOSURE (N/A) XI ROBOTIC ASSISTED BILATERAL SALPINGO OOPHORECTOMY (Bilateral) POSSIBLE XI ROBOTIC ASSISTED TOTAL HYSTERECTOMY WITH POSSIBLE STAGING (N/A) as a surgical intervention.  The patient's history has been reviewed, patient examined, no change in status, stable for surgery.  I have reviewed the patient's chart and labs.  Questions were answered to the patient's satisfaction.     Lafonda Mosses

## 2022-05-10 NOTE — Brief Op Note (Signed)
05/10/2022  2:33 PM  PATIENT:  Sarah Bishop  81 y.o. 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  PROCEDURE:  Procedure(s): XI ROBOTIC ASSISTED BILATERAL SALPINGO OOPHORECTOMY, TOTAL HYSTERECTOMY, CYSTOSCOPY (Bilateral)  SURGEON:  Surgeon(s) and Role:    Lafonda Mosses, MD - Primary    Lahoma Crocker, MD - Assisting  ANESTHESIA:   general  EBL:  150 mL   BLOOD ADMINISTERED:none  DRAINS: none   LOCAL MEDICATIONS USED:  MARCAINE     SPECIMEN:  uterus, cervix, tubes and ovaries, pelvic washings, anterior cul de sac biopsy/adhesion  DISPOSITION OF SPECIMEN:  PATHOLOGY  COUNTS:  YES  TOURNIQUET:  * No tourniquets in log *  DICTATION: .Note written in EPIC  PLAN OF CARE: Discharge to home after PACU  PATIENT DISPOSITION:  PACU - hemodynamically stable.   Delay start of Pharmacological VTE agent (>24hrs) due to surgical blood loss or risk of bleeding: no

## 2022-05-10 NOTE — Anesthesia Postprocedure Evaluation (Signed)
Anesthesia Post Note  Patient: Sarah Bishop  Procedure(s) Performed: XI ROBOTIC ASSISTED BILATERAL SALPINGO OOPHORECTOMY, TOTAL HYSTERECTOMY, CYSTOSCOPY (Bilateral: Abdomen)     Patient location during evaluation: PACU Anesthesia Type: General Level of consciousness: sedated and patient cooperative Pain management: pain level controlled Vital Signs Assessment: post-procedure vital signs reviewed and stable Respiratory status: spontaneous breathing Cardiovascular status: stable Anesthetic complications: no   No notable events documented.  Last Vitals:  Vitals:   05/10/22 1530 05/10/22 1545  BP: (!) 119/57 (!) 122/59  Pulse: (!) 59   Resp: 10 13  Temp: (!) 36.1 C   SpO2: 95% 95%    Last Pain:  Vitals:   05/10/22 1545  TempSrc:   PainSc: [place]

## 2022-05-10 NOTE — Discharge Instructions (Addendum)
AFTER SURGERY INSTRUCTIONS   Return to work: 4-6 weeks if applicable  YOU WILL NEED TO BE ON LOVENOX INJECTIONS ONCE A DAY AT THE SAME TIME EACH DAY FOR THE NEXT 2 WEEKS TO PREVENT BLOOD CLOTS. PLAN TO START THIS THURSDAY MORNING (AUGUST  10).   Activity: 1. Be up and out of the bed during the day.  Take a nap if needed.  You may walk up steps but be careful and use the hand rail.  Stair climbing will tire you more than you think, you may need to stop part way and rest.    2. No lifting or straining for 6 weeks over 10 pounds. No pushing, pulling, straining for 6 weeks.   3. No driving for around 1 week(s).  Do not drive if you are taking narcotic pain medicine and make sure that your reaction time has returned.    4. You can shower as soon as the next day after surgery. Shower daily.  Use your regular soap and water (not directly on the incision) and pat your incision(s) dry afterwards; don't rub.  No tub baths or submerging your body in water until cleared by your surgeon. If you have the soap that was given to you by pre-surgical testing that was used before surgery, you do not need to use it afterwards because this can irritate your incisions.    5. No sexual activity and nothing in the vagina for 8 weeks since you had a hysterectomy.   6. You may experience a small amount of clear drainage from your incisions, which is normal.  If the drainage persists, increases, or changes color please call the office.   7. Do not use creams, lotions, or ointments such as neosporin on your incisions after surgery until advised by your surgeon because they can cause removal of the dermabond glue on your incisions.     8. You may experience vaginal spotting after surgery or around the 6-8 week mark from surgery when the stitches at the top of the vagina begin to dissolve since you had a hysterectomy.  The spotting is normal but if you experience heavy bleeding, call our office.   9. Take Tylenol first for  pain and only use narcotic pain medication for severe pain not relieved by the Tylenol.  Monitor your Tylenol intake to a max of 4,000 mg in a 24 hour period.    Diet: 1. Low sodium Heart Healthy Diet is recommended but you are cleared to resume your normal (before surgery) diet after your procedure.   2. It is safe to use a laxative, such as Miralax or Colace, if you have difficulty moving your bowels. You have been prescribed Sennakot-S to take at bedtime every evening after surgery to keep bowel movements regular and to prevent constipation.     Wound Care: 1. Keep clean and dry.  Shower daily.   Reasons to call the Doctor: Fever - Oral temperature greater than 100.4 degrees Fahrenheit Foul-smelling vaginal discharge Difficulty urinating Nausea and vomiting Increased pain at the site of the incision that is unrelieved with pain medicine. Difficulty breathing with or without chest pain New calf pain especially if only on one side Sudden, continuing increased vaginal bleeding with or without clots.   Contacts: For questions or concerns you should contact:   Dr. Jeral Pinch at [phone]   [name], NP at [phone]   After Hours: call [phone] and have the GYN Oncologist paged/contacted (after 5 pm or on the  weekends).   Messages sent via mychart are for non-urgent matters and are not responded to after hours so for urgent needs, please call the after hours number.

## 2022-05-10 NOTE — Transfer of Care (Signed)
Immediate Anesthesia Transfer of Care Note  Patient: Sarah Bishop  Procedure(s) Performed: Procedure(s) (LRB): XI ROBOTIC ASSISTED BILATERAL SALPINGO OOPHORECTOMY, TOTAL HYSTERECTOMY, CYSTOSCOPY (Bilateral)  Patient Location: PACU  Anesthesia Type: General  Level of Consciousness: awake, sedated, patient cooperative and responds to stimulation  Airway & Oxygen Therapy: Patient Spontanous Breathing and Patient connected to face mask oxygen  Post-op Assessment: Report given to PACU RN, Post -op Vital signs reviewed and stable and Patient moving all extremities  Post vital signs: Reviewed and stable  Complications: No apparent anesthesia complications

## 2022-05-10 NOTE — Anesthesia Preprocedure Evaluation (Addendum)
Anesthesia Evaluation  Patient identified by MRN, date of birth, ID band Patient awake    Reviewed: Allergy & Precautions, NPO status , Patient's Chart, lab work & pertinent test results  Airway Mallampati: III  TM Distance: >3 FB Neck ROM: Limited    Dental  (+) Dental Advisory Given, Chipped, Caps,    Pulmonary shortness of breath, sleep apnea , former smoker,    Pulmonary exam normal breath sounds clear to auscultation       Cardiovascular hypertension, Pt. on medications and Pt. on home beta blockers + Peripheral Vascular Disease  + Valvular Problems/Murmurs  Rhythm:Regular Rate:Normal     Neuro/Psych Seizures -, Well Controlled,     GI/Hepatic negative GI ROS, Neg liver ROS,   Endo/Other  diabetesHypothyroidism Morbid obesity  Renal/GU negative Renal ROS     Musculoskeletal  (+) Arthritis ,   Abdominal (+) + obese,   Peds  Hematology negative hematology ROS (+)   Anesthesia Other Findings   Reproductive/Obstetrics                            Anesthesia Physical Anesthesia Plan  ASA: 3  Anesthesia Plan: General   Post-op Pain Management: Ketamine IV*, Precedex and Tylenol PO (pre-op)*   Induction: Intravenous  PONV Risk Score and Plan: 4 or greater and Ondansetron, Dexamethasone and Treatment may vary due to age or medical condition  Airway Management Planned: Oral ETT  Additional Equipment:   Intra-op Plan:   Post-operative Plan: Extubation in OR  Informed Consent: I have reviewed the patients History and Physical, chart, labs and discussed the procedure including the risks, benefits and alternatives for the proposed anesthesia with the patient or authorized representative who has indicated his/her understanding and acceptance.     Dental advisory given  Plan Discussed with: CRNA  Anesthesia Plan Comments: (2 x PIV)       Anesthesia Quick Evaluation

## 2022-05-11 ENCOUNTER — Other Ambulatory Visit: Payer: Self-pay | Admitting: Gynecologic Oncology

## 2022-05-11 ENCOUNTER — Other Ambulatory Visit: Payer: Self-pay

## 2022-05-11 ENCOUNTER — Telehealth: Payer: Self-pay

## 2022-05-11 ENCOUNTER — Encounter (HOSPITAL_COMMUNITY): Payer: Self-pay | Admitting: Gynecologic Oncology

## 2022-05-11 ENCOUNTER — Other Ambulatory Visit: Payer: Medicare Other

## 2022-05-11 DIAGNOSIS — R7989 Other specified abnormal findings of blood chemistry: Secondary | ICD-10-CM

## 2022-05-11 NOTE — Telephone Encounter (Signed)
Spoke with daughter [name]. Told her that since her mother's kidney function was elevated prior to surgery, she needs to come into the office tomorrow to recheck her kidney function. Appointment is at 1215 pm. Her mother needs to get in at least 64 oz of caffeine free fluid daily especially being on Lovenox Daughter verbalized understanding. Told daughter that the Lovenox was to begin tomorrow morning per written discharge instructions 05-10-22.

## 2022-05-11 NOTE — Telephone Encounter (Signed)
Spoke with Sarah Bishop this afternoon. She states she is eating, drinking and urinating well. She has not had a BM yet but is passing gas. She is taking senokot as prescribed and encouraged her to drink plenty of water. She denies fever or chills. Incisions are dry and intact. She rates her pain 5/10. Her pain is controlled with tramadol and Tylenol.   Instructed to call office with any fever, chills, purulent drainage, uncontrolled pain or any other questions or concerns. Patient verbalizes understanding.   Pt aware of post op appointments as well as the office number [phone] and after hours number [phone] to call if she has any questions or concerns

## 2022-05-11 NOTE — Progress Notes (Signed)
Bmet ordered post-op to follow up on elevated creatinine level.

## 2022-05-12 ENCOUNTER — Inpatient Hospital Stay: Payer: Medicare Other | Attending: Gynecologic Oncology

## 2022-05-12 ENCOUNTER — Telehealth: Payer: Self-pay

## 2022-05-12 ENCOUNTER — Telehealth: Payer: Self-pay | Admitting: Gynecologic Oncology

## 2022-05-12 ENCOUNTER — Other Ambulatory Visit: Payer: Self-pay

## 2022-05-12 DIAGNOSIS — I129 Hypertensive chronic kidney disease with stage 1 through stage 4 chronic kidney disease, or unspecified chronic kidney disease: Secondary | ICD-10-CM | POA: Insufficient documentation

## 2022-05-12 DIAGNOSIS — E1122 Type 2 diabetes mellitus with diabetic chronic kidney disease: Secondary | ICD-10-CM | POA: Diagnosis not present

## 2022-05-12 DIAGNOSIS — N183 Chronic kidney disease, stage 3 unspecified: Secondary | ICD-10-CM | POA: Diagnosis not present

## 2022-05-12 DIAGNOSIS — Z90722 Acquired absence of ovaries, bilateral: Secondary | ICD-10-CM | POA: Diagnosis not present

## 2022-05-12 DIAGNOSIS — Z90710 Acquired absence of both cervix and uterus: Secondary | ICD-10-CM | POA: Insufficient documentation

## 2022-05-12 DIAGNOSIS — R7989 Other specified abnormal findings of blood chemistry: Secondary | ICD-10-CM

## 2022-05-12 DIAGNOSIS — E1142 Type 2 diabetes mellitus with diabetic polyneuropathy: Secondary | ICD-10-CM | POA: Insufficient documentation

## 2022-05-12 DIAGNOSIS — C5701 Malignant neoplasm of right fallopian tube: Secondary | ICD-10-CM | POA: Insufficient documentation

## 2022-05-12 LAB — BASIC METABOLIC PANEL
Anion gap: 6 (ref 5–15)
BUN: 45 mg/dL — ABNORMAL HIGH (ref 8–23)
CO2: 30 mmol/L (ref 22–32)
Calcium: 9.1 mg/dL (ref 8.9–10.3)
Chloride: 100 mmol/L (ref 98–111)
Creatinine, Ser: 1.43 mg/dL — ABNORMAL HIGH (ref 0.44–1.00)
GFR, Estimated: 37 mL/min — ABNORMAL LOW (ref >60)
Glucose, Bld: 54 mg/dL — ABNORMAL LOW (ref 70–99)
Potassium: 4.1 mmol/L (ref 3.5–5.1)
Sodium: 136 mmol/L (ref 135–145)

## 2022-05-12 LAB — CYTOLOGY - NON PAP

## 2022-05-12 NOTE — Telephone Encounter (Signed)
Spoke with the patient and daughter.  Reviewed results from surgery.  Based on high-grade carcinoma seen in the fallopian tube and extending into the fimbria and given dense adhesions of the fimbria to surrounding structures, clinically she had stage IIb disease.  Given this, I am recommending adjuvant chemotherapy.  Will get her scheduled with Dr. Alvy Bimler.  Daughter and her husband are out of town the week of the 21st, we will plan to get her scheduled the following week.  Patient is doing well postoperatively, has not yet had a bowel movement but is reporting flatus.  Jeral Pinch MD Gynecologic Oncology

## 2022-05-12 NOTE — Telephone Encounter (Signed)
Told daughter Sarah Bishop that her mother's kidney function is slightly improved. [name], NP stated that she can continue on the current dose of Lovenox daily and she just needs to continue to at least take in 64 oz of caffeine free fluid a day. Daughter verbalized understanding.

## 2022-05-14 LAB — TYPE AND SCREEN
ABO/RH(D): A POS
Antibody Screen: POSITIVE
Donor AG Type: NEGATIVE
Donor AG Type: NEGATIVE
Unit division: 0
Unit division: 0

## 2022-05-14 LAB — BPAM RBC
Blood Product Expiration Date: 202308232359
Blood Product Expiration Date: 202308232359
Unit Type and Rh: 6200
Unit Type and Rh: 6200

## 2022-05-17 ENCOUNTER — Encounter: Payer: Self-pay | Admitting: Gynecologic Oncology

## 2022-05-17 ENCOUNTER — Telehealth: Payer: Self-pay | Admitting: Licensed Clinical Social Worker

## 2022-05-17 ENCOUNTER — Inpatient Hospital Stay (HOSPITAL_BASED_OUTPATIENT_CLINIC_OR_DEPARTMENT_OTHER): Payer: Medicare Other | Admitting: Gynecologic Oncology

## 2022-05-17 DIAGNOSIS — Z90710 Acquired absence of both cervix and uterus: Secondary | ICD-10-CM

## 2022-05-17 DIAGNOSIS — C5702 Malignant neoplasm of left fallopian tube: Secondary | ICD-10-CM | POA: Insufficient documentation

## 2022-05-17 DIAGNOSIS — Z7189 Other specified counseling: Secondary | ICD-10-CM

## 2022-05-17 DIAGNOSIS — Z90722 Acquired absence of ovaries, bilateral: Secondary | ICD-10-CM

## 2022-05-17 NOTE — Addendum Note (Signed)
Addended by: Lafonda Mosses on: 05/17/2022 11:30 AM   Modules accepted: Orders

## 2022-05-17 NOTE — Progress Notes (Signed)
Gynecologic Oncology Telehealth Note: Gyn-Onc  I connected with Sarah Bishop on 05/17/22 at  4:40 PM EDT by telephone and verified that I am speaking with the correct person using two identifiers.  I discussed the limitations, risks, security and privacy concerns of performing an evaluation and management service by telemedicine and the availability of in-person appointments. I also discussed with the patient that there may be a patient responsible charge related to this service. The patient expressed understanding and agreed to proceed.  Other persons participating in the visit and their role in the encounter: none.  Patient's location: home Provider's location: [facility] [place]  Reason for Visit: follow-up after surgery, treatment planning  Treatment History: Oncology History  Fallopian tube cancer, carcinoma, left ([place])  03/29/2022 Imaging   MRI of the abdomen and pelvis reveals a left adnexal/ovarian mass measuring 5.5 x 2.8 x 3.7 cm with solid diffuse enhancement and T2 signal characteristics and intermediate to accentuated T1 signal characteristics.  No significant cystic component or ascites.  Renal lesion of the right kidney lower pole is thought to represent a benign lesion.  1.3 cm cystic lesion of the head of the pancreas is recommended to be follow-up with pancreatic MRI in 2 years.   05/02/2022 Tumor Marker   Patient's tumor was tested for the following markers: CA-125. Results of the tumor marker test revealed 69.   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 Fullness appreciated within the left cul de sac. On intra-abdominal entry, normal upper abdominal survey including liver edge, diaphragm, and stomach. Normal appearing omentum. No ascites. Normal appearing small bowel.  Significant inflammation within the pelvis concerning initially for recent diverticulitis versus endometriosis. Thickened and inflamed pelvic peritoneum. Filmy adhesions between the anterior cul de sac and uterus, between the right adnexa and and the sigmoid colon. Right adnexa inflammed but otherwise normal in appearing. Left fallopian tube dilated with evidence of hydrosalpinx. Friable adhesions of the fallopian tube to the sigmoid mesentery, left ovary and left broad ligament. No ascites. Uterus bulbous and 10cm with findings of endosalpingiosis versus endometriosis on much of the uterine serosa. Dense adhesions between the bladder and cervix. Cul de sac without evidence of disease. No ascites.  Cystoscopy findings: bladder dome intact, normal efflux from bilateral ureteral orifices. On frozen section, no abnormality of the endometrium. Anterior cul de sac adhesion with findings of hemosiderin, no definitive malignancy. Fallopian tube with malignancy.  Given findings and pelvic peritoneal involvement given fallopian tube adherent to surrounding structures, no upper abdominal findings, normal omentum, and normal neuroassessment intraoperatively as well as on preoperative imaging, lymphadenectomy deferred.   05/10/2022 Pathology Results   Procedure: Hysterectomy with bilateral salpingo-oophorectomy  Specimen Integrity: Intact  Tumor Site: Left fallopian tube  Tumor Size: 6.3 x 3.8 x 2.8 cm  Histologic Type: Serous carcinoma  Histologic Grade: High-grade  Ovarian Surface Involvement: Not identified  Fallopian Tube Surface Involvement: Not identified  Implants (required for advanced stage serous/seromucinous borderline  tumors only): Not applicable  Other Tissue/ Organ Involvement: Not applicable  Largest Extrapelvic Peritoneal Focus: Not applicable  Peritoneal/Ascitic Fluid Involvement: Not identified [facility] [id number])  Chemotherapy Response Score (CRS): Not applicable, no known presurgical  therapy   Regional Lymph Nodes: Not applicable (no lymph nodes submitted or found)  Distant Metastasis: Not applicable  Pathologic Stage Classification (pTNM, AJCC 8th Edition):  pT1a, pN n/a  Ancillary Studies: Available upon request  Representative Tumor Block: B15  Comment(s): An immunohistochemistry stain for p53 is performed with  adequate control and is diffusely positive within the tumor.  The same  stain shows patchy variable staining within the reactive mesothelium  lining the serosal adhesions within the anterior cul-de-sac    05/17/2022 Initial Diagnosis   Fallopian tube cancer, carcinoma, left (HCC)     Interval History: Feeling well, recovery each day. Finally bowel movement two days ago. Had significant pain prior to that with constipation. Using Miralax with good effect. Denies pain.  Denies urinary symptoms. Denies bleeding or discharge.   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[facility])    Past Surgical History:  Procedure Laterality Date   BREAST BIOPSY Left    BREAST BIOPSY Left    DILATION AND CURETTAGE OF UTERUS     DILITATION & CURRETTAGE/HYSTROSCOPY WITH VERSAPOINT RESECTION N/A 12/26/2012   Procedure: DILATATION & CURETTAGE/HYSTEROSCOPY WITH VERSAPOINT RESECTION;  Surgeon: Princess Bruins, MD;  Location: [facility];  Service: Gynecology;  Laterality: N/A;   JOINT REPLACEMENT      MANDIBLE FRACTURE SURGERY     ROBOTIC ASSISTED BILATERAL SALPINGO OOPHERECTOMY Bilateral 05/10/2022   Procedure: XI ROBOTIC ASSISTED BILATERAL SALPINGO OOPHORECTOMY, TOTAL HYSTERECTOMY, CYSTOSCOPY;  Surgeon: Lafonda Mosses, MD;  Location: [facility];  Service: Gynecology;  Laterality: Bilateral;   TOTAL KNEE ARTHROPLASTY Bilateral     Family History  Problem Relation Age of Onset   Heart disease Maternal Grandmother    Heart disease Maternal Grandfather    Stroke Maternal Grandfather    Colon cancer Maternal Grandfather    Breast cancer Neg Hx    Ovarian cancer Neg Hx    Endometrial cancer Neg Hx    Pancreatic cancer Neg Hx    Prostate cancer Neg Hx     Social History   Socioeconomic History   Marital status: Divorced    Spouse name: Not on file   Number of children: Not on file   Years of education: Not on file   Highest education level: Not on file  Occupational History   Not on file  Tobacco Use   Smoking status: Former   Smokeless tobacco: Never  Vaping Use   Vaping Use: Never used  Substance and Sexual Activity   Alcohol use: Yes    Comment: rare   Drug use: No   Sexual activity: Not Currently  Other Topics Concern   Not on file  Social History Narrative   ** Merged History Encounter **       Social Determinants of Health   Financial Resource Strain: Not on file  Food Insecurity: Not on file  Transportation Needs: Not on file  Physical Activity: Not on file  Stress: Not on file  Social Connections: Not on  file    Current Medications:  Current Outpatient Medications:    acetaminophen (TYLENOL) 500 MG tablet, Take 500-1,000 mg by mouth every 6 (six) hours as needed (pain.)., Disp: , Rfl:    allopurinol (ZYLOPRIM) 300 MG tablet, Take 300 mg by mouth at bedtime., Disp: , Rfl:    amLODipine (NORVASC) 5 MG tablet, Take 5 mg by mouth at bedtime., Disp: , Rfl:    atenolol (TENORMIN) 50 MG tablet, Take 100 mg by mouth at bedtime., Disp: , Rfl:    atorvastatin  (LIPITOR) 80 MG tablet, Take 80 mg by mouth every other day. At night, Disp: , Rfl:    cholecalciferol (VITAMIN D3) 25 MCG (1000 UNIT) tablet, Take 2,000 Units by mouth at bedtime., Disp: , Rfl:    citalopram (CELEXA) 10 MG tablet, Take 10 mg by mouth in the morning., Disp: , Rfl:    divalproex (DEPAKOTE) 250 MG DR tablet, Take 750 mg by mouth in the morning., Disp: , Rfl:    enoxaparin (LOVENOX) 40 MG/0.4ML injection, Inject 0.4 mLs (40 mg total) into the skin daily for 14 days., Disp: 14 mL, Rfl: 0   furosemide (LASIX) 20 MG tablet, Take 40 mg by mouth in the morning., Disp: , Rfl:    glipiZIDE (GLUCOTROL) 5 MG tablet, Take 5 mg by mouth in the morning., Disp: , Rfl:    Lancets (ONETOUCH DELICA PLUS [id number]) MISC, Apply topically daily., Disp: , Rfl:    levothyroxine (SYNTHROID) 50 MCG tablet, Take 50 mcg by mouth daily before breakfast., Disp: , Rfl:    miconazole (BAZA ANTIFUNGAL) 2 % cream, Apply 1 Application topically 2 (two) times daily as needed (skin irritation.)., Disp: , Rfl:    senna-docusate (SENOKOT-S) 8.6-50 MG tablet, Take 2 tablets by mouth at bedtime. For AFTER surgery, do not take if having diarrhea, Disp: 30 tablet, Rfl: 0   traMADol (ULTRAM) 50 MG tablet, Take 1 tablet (50 mg total) by mouth every 6 (six) hours as needed for severe pain. For AFTER surgery only, do not take and drive, Disp: 10 tablet, Rfl: 0  Review of Symptoms: Pertinent positives as per HPI.  Physical Exam: There were no vitals taken for this visit. Deferred the limitations of phone visit.  Laboratory & Radiologic Studies: None new  Assessment & Plan: Sarah Bishop is a 81 y.o. 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  Patient is doing well, continues to meet postoperative milestones.  Discussed continued expectations.  Reviewed with her again findings from surgery as well as pathology.  Given adhesions between the fallopian  tube and surrounding peritoneal structures, staging her as a IIB.  Plan for adjuvant chemotherapy.  She is scheduled to see Dr. Alvy Bimler on 8/28.  We will plan for genetic and genomic testing.  I discussed the assessment and treatment plan with the patient. The patient was provided with an opportunity to ask questions and all were answered. The patient agreed with the plan and demonstrated an understanding of the instructions.   The patient was advised to call back or see an in-person evaluation if the symptoms worsen or if the condition fails to improve as anticipated.   8 minutes of total time was spent for this patient encounter, including preparation, phone counseling with the patient and coordination of care, and documentation of the encounter.   Jeral Pinch, MD  Division of Gynecologic Oncology  Department of Obstetrics and Gynecology  [facility]

## 2022-05-17 NOTE — Telephone Encounter (Signed)
Scheduled appt per 8/15 referral. Pt's daughter is aware of appt date and time. Pt's daughter is aware to arrive 15 mins prior to appt time and to bring and updated insurance card. Pt's daughter is aware of appt location.   

## 2022-05-23 ENCOUNTER — Ambulatory Visit: Payer: Medicare Other | Admitting: Hematology and Oncology

## 2022-05-23 ENCOUNTER — Other Ambulatory Visit: Payer: Self-pay | Admitting: *Deleted

## 2022-05-23 NOTE — Progress Notes (Signed)
The proposed treatment discussed in conference is for discussion purpose only and is not a binding recommendation.  The patients have not been physically examined, or presented with their treatment options.  Therefore, final treatment plans cannot be decided.  

## 2022-05-30 ENCOUNTER — Other Ambulatory Visit: Payer: Self-pay

## 2022-05-30 ENCOUNTER — Inpatient Hospital Stay: Payer: Medicare Other | Admitting: Hematology and Oncology

## 2022-05-30 ENCOUNTER — Encounter: Payer: Self-pay | Admitting: Hematology and Oncology

## 2022-05-30 VITALS — BP 144/46 | HR 61 | Temp 97.8°F | Resp 18 | Ht 63.0 in | Wt 223.8 lb

## 2022-05-30 DIAGNOSIS — E1142 Type 2 diabetes mellitus with diabetic polyneuropathy: Secondary | ICD-10-CM | POA: Diagnosis not present

## 2022-05-30 DIAGNOSIS — N183 Chronic kidney disease, stage 3 unspecified: Secondary | ICD-10-CM | POA: Insufficient documentation

## 2022-05-30 DIAGNOSIS — C5702 Malignant neoplasm of left fallopian tube: Secondary | ICD-10-CM | POA: Diagnosis not present

## 2022-05-30 DIAGNOSIS — C5701 Malignant neoplasm of right fallopian tube: Secondary | ICD-10-CM | POA: Diagnosis not present

## 2022-05-30 MED ORDER — ONDANSETRON HCL 8 MG PO TABS: 8.0000 mg | ORAL_TABLET | Freq: Three times a day (TID) | ORAL | 1 refills | Status: DC | PRN

## 2022-05-30 MED ORDER — LIDOCAINE-PRILOCAINE 2.5-2.5 % EX CREA: TOPICAL_CREAM | CUTANEOUS | 3 refills | Status: DC

## 2022-05-30 MED ORDER — DEXAMETHASONE 4 MG PO TABS: ORAL_TABLET | ORAL | 6 refills | Status: DC

## 2022-05-30 MED ORDER — PROCHLORPERAZINE MALEATE 10 MG PO TABS: 10.0000 mg | ORAL_TABLET | Freq: Four times a day (QID) | ORAL | 1 refills | Status: DC | PRN

## 2022-05-30 NOTE — Progress Notes (Signed)
START ON PATHWAY REGIMEN - Ovarian     A cycle is every 21 days:     Paclitaxel      Carboplatin   **Always confirm dose/schedule in your pharmacy ordering system**  Patient Characteristics: Postoperative without Neoadjuvant Therapy (Pathologic Staging), Newly Diagnosed, Adjuvant Therapy, Stage IIA BRCA Mutation Status: Awaiting Test Results Therapeutic Status: Postoperative without Neoadjuvant Therapy (Pathologic Staging) AJCC 8 Stage Grouping: II AJCC M Category: cM0 AJCC T Category: pT2 AJCC N Category: pN0 Intent of Therapy: Curative Intent, Discussed with Patient 

## 2022-05-31 ENCOUNTER — Other Ambulatory Visit: Payer: Self-pay

## 2022-05-31 ENCOUNTER — Encounter: Payer: Self-pay | Admitting: Hematology and Oncology

## 2022-05-31 ENCOUNTER — Encounter: Payer: Self-pay | Admitting: Gynecologic Oncology

## 2022-05-31 NOTE — Progress Notes (Signed)
[place] NOTE  Patient Care Team: Sarah Sidle, MD as PCP - General Sarah Monarch, MD as Consulting Physician (Dermatology) Sarah Monarch, MD as Consulting Physician (Dermatology)  ASSESSMENT & PLAN:  Fallopian tube cancer, carcinoma, left [facility]) We discussed the role of adjuvant treatment Due to her pre-existing severe peripheral neuropathy from diabetes and baseline chronic renal failure, I recommend upfront dose reduction of paclitaxel We will adjust the dose of carboplatin accordingly I will schedule chemo education class and port placement  We reviewed the NCCN guidelines We discussed the role of chemotherapy. The intent is of curative intent.  We discussed some of the risks, benefits, side-effects of carboplatin & Taxol. Treatment is intravenous, every 3 weeks x 6 cycles  Some of the short term side-effects included, though not limited to, including weight loss, life threatening infections, risk of allergic reactions, need for transfusions of blood products, nausea, vomiting, change in bowel habits, loss of hair, admission to hospital for various reasons, and risks of death.   Long term side-effects are also discussed including risks of infertility, permanent damage to nerve function, hearing loss, chronic fatigue, kidney damage with possibility needing hemodialysis, and rare secondary malignancy including bone marrow disorders.  The patient is aware that the response rates discussed earlier is not guaranteed.  After a long discussion, patient made an informed decision to proceed with the prescribed plan of care.   Patient education material was dispensed. We discussed premedication with dexamethasone before chemotherapy.   CKD (chronic kidney disease), stage III (HCC) We discussed risk factors for worsening kidney failure We discussed importance of adequate hydration  Type 2 diabetes, controlled, with peripheral neuropathy ([place]) We discussed risk  of worsening diabetes control while on treatment and risk of worsening peripheral neuropathy I will prescribe upfront dose reduction  Orders Placed This Encounter  Procedures   IR IMAGING GUIDED PORT INSERTION    Standing Status:   Future    Standing Expiration Date:   05/31/2023    Order Specific Question:   Reason for Exam (SYMPTOM  OR DIAGNOSIS REQUIRED)    Answer:   need port for chemo on 9/8    Order Specific Question:   Preferred Imaging Location?    Answer:   [facility]   CBC with Differential ([place] Only)    Standing Status:   Future    Standing Expiration Date:   06/11/2023   CMP ([place] only)    Standing Status:   Future    Standing Expiration Date:   06/11/2023   CBC with Differential ([place] Only)    Standing Status:   Future    Standing Expiration Date:   07/02/2023   CMP ([place] only)    Standing Status:   Future    Standing Expiration Date:   07/02/2023   CBC with Differential ([place] Only)    Standing Status:   Future    Standing Expiration Date:   07/23/2023   CMP ([place] only)    Standing Status:   Future    Standing Expiration Date:   07/23/2023   CBC with Differential ([place] Only)    Standing Status:   Future    Standing Expiration Date:   08/13/2023   CMP ([place] only)    Standing Status:   Future    Standing Expiration Date:   08/13/2023   CBC with Differential ([facility] Only)    Standing Status:   Future  Standing Expiration Date:   09/03/2023   CMP ([place] only)    Standing Status:   Future    Standing Expiration Date:   09/03/2023   CBC with Differential ([facility] Only)    Standing Status:   Future    Standing Expiration Date:   09/24/2023   CMP ([place] only)    Standing Status:   Future    Standing Expiration Date:   09/24/2023    The total time spent in the appointment was 60 minutes encounter with patients including review of chart and various tests  results, discussions about plan of care and coordination of care plan   All questions were answered. The patient knows to call the clinic with any problems, questions or concerns. No barriers to learning was detected.  Sarah Lark, MD 8/29/20236:52 AM  CHIEF COMPLAINTS/PURPOSE OF CONSULTATION:  Fallopian tube cancer, for further evaluation and adjuvant treatment  HISTORY OF PRESENTING ILLNESS:  Sarah Bishop 81 y.o. female is here because of recent diagnosis of fallopian tube cancer She is here accompanied by her daughter, Sarah Bishop She is retired and lives with her daughter She has background history of peripheral neuropathy secondary to diabetes, chronic renal failure and history of severe anemia  I have reviewed her chart and materials related to her cancer extensively and collaborated history with the patient. Summary of oncologic history is as follows: Oncology History Overview Note  HER2 negative, High grade serous   Fallopian tube cancer, carcinoma, left ([place])  03/29/2022 Imaging   MRI of the abdomen and pelvis reveals a left adnexal/ovarian mass measuring 5.5 x 2.8 x 3.7 cm with solid diffuse enhancement and T2 signal characteristics and intermediate to accentuated T1 signal characteristics.  No significant cystic component or ascites.  Renal lesion of the right kidney lower pole is thought to represent a benign lesion.  1.3 cm cystic lesion of the head of the pancreas is recommended to be follow-up with pancreatic MRI in 2 years.   05/02/2022 Tumor Marker   Patient's tumor was tested for the following markers: CA-125. Results of the tumor marker test revealed 69.   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 Fullness appreciated within the left cul de sac. On  intra-abdominal entry, normal upper abdominal survey including liver edge, diaphragm, and stomach. Normal appearing omentum. No ascites. Normal appearing small bowel. Significant inflammation within the pelvis concerning initially for recent diverticulitis versus endometriosis. Thickened and inflamed pelvic peritoneum. Filmy adhesions between the anterior cul de sac and uterus, between the right adnexa and and the sigmoid colon. Right adnexa inflammed but otherwise normal in appearing. Left fallopian tube dilated with evidence of hydrosalpinx. Friable adhesions of the fallopian tube to the sigmoid mesentery, left ovary and left broad ligament. No ascites. Uterus bulbous and 10cm with findings of endosalpingiosis versus endometriosis on much of the uterine serosa. Dense adhesions between the bladder and cervix. Cul de sac without evidence of disease. No ascites.  Cystoscopy findings: bladder dome intact, normal efflux from bilateral ureteral orifices. On frozen section, no abnormality of the endometrium. Anterior cul de sac adhesion with findings of hemosiderin, no definitive malignancy. Fallopian tube with malignancy.  Given findings and pelvic peritoneal involvement given fallopian tube adherent to surrounding structures, no upper abdominal findings, normal omentum, and normal neuroassessment intraoperatively as well as  on preoperative imaging, lymphadenectomy deferred.   05/10/2022 Pathology Results   Procedure: Hysterectomy with bilateral salpingo-oophorectomy  Specimen Integrity: Intact  Tumor Site: Left fallopian tube  Tumor Size: 6.3 x 3.8 x 2.8 cm  Histologic Type: Serous carcinoma  Histologic Grade: High-grade  Ovarian Surface Involvement: Not identified  Fallopian Tube Surface Involvement: Not identified  Implants (required for advanced stage serous/seromucinous borderline  tumors only): Not applicable  Other Tissue/ Organ Involvement: Not applicable  Largest Extrapelvic Peritoneal Focus: Not  applicable  Peritoneal/Ascitic Fluid Involvement: Not identified [facility] [phone])  Chemotherapy Response Score (CRS): Not applicable, no known presurgical  therapy  Regional Lymph Nodes: Not applicable (no lymph nodes submitted or found)  Distant Metastasis: Not applicable  Pathologic Stage Classification (pTNM, AJCC 8th Edition): pT1a, pN n/a  Ancillary Studies: Available upon request  Representative Tumor Block: B15  Comment(s): An immunohistochemistry stain for p53 is performed with  adequate control and is diffusely positive within the tumor.  The same  stain shows patchy variable staining within the reactive mesothelium  lining the serosal adhesions within the anterior cul-de-sac    05/17/2022 Initial Diagnosis   Fallopian tube cancer, carcinoma, left ([place])   05/30/2022 Cancer Staging   Staging form: Ovary, Fallopian Tube, and Primary Peritoneal Carcinoma, AJCC 8th Edition - Pathologic stage from 05/30/2022: FIGO Stage IIB (pT2b, pN0, cM0) - Signed by Sarah Lark, MD on 05/30/2022 Stage prefix: Initial diagnosis   06/10/2022 -  Chemotherapy   Patient is on Treatment Plan : OVARIAN Carboplatin (AUC 6) + Paclitaxel (175) q21d X 6 Cycles       MEDICAL HISTORY:  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   Right Mid Paraspinal-Moderate   Diabetes ([place]) 03/09/2022   Diabetes mellitus without complication (HCC)    Dyspnea    with exertion   Epilepsy ([place]) 03/09/2022   Gout    Heart murmur 03/09/2022   Hx of seizure disorder 03/09/2022   Hyperlipidemia    Hypertension    Hypertension 03/09/2022   Hypothyroidism    Peripheral vascular disease ([place])    Seizures ([place])    diagnosed age 20yrs, no seizures for "yrs"   Sleep apnea    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[place])    SURGICAL HISTORY: Past Surgical History:  Procedure Laterality Date   BREAST BIOPSY Left    BREAST BIOPSY Left     DILATION AND CURETTAGE OF UTERUS     DILITATION & CURRETTAGE/HYSTROSCOPY WITH VERSAPOINT RESECTION N/A 12/26/2012   Procedure: DILATATION & CURETTAGE/HYSTEROSCOPY WITH VERSAPOINT RESECTION;  Surgeon: Princess Bruins, MD;  Location: [facility];  Service: Gynecology;  Laterality: N/A;   JOINT REPLACEMENT     MANDIBLE FRACTURE SURGERY     ROBOTIC ASSISTED BILATERAL SALPINGO OOPHERECTOMY Bilateral 05/10/2022   Procedure: XI ROBOTIC ASSISTED BILATERAL SALPINGO OOPHORECTOMY, TOTAL HYSTERECTOMY, CYSTOSCOPY;  Surgeon: Lafonda Mosses, MD;  Location: [facility];  Service: Gynecology;  Laterality: Bilateral;   TOTAL KNEE ARTHROPLASTY Bilateral     SOCIAL HISTORY: Social History   Socioeconomic History   Marital status: Divorced    Spouse name: Not on file   Number of children: Not on file   Years of education: Not on file   Highest education level: Not on file  Occupational History   Not on file  Tobacco Use   Smoking status: Former   Smokeless tobacco: Never  Vaping Use   Vaping Use: Never  used  Substance and Sexual Activity   Alcohol use: Yes    Comment: rare   Drug use: No   Sexual activity: Not Currently  Other Topics Concern   Not on file  Social History Narrative   ** Merged History Encounter **       Social Determinants of Health   Financial Resource Strain: Not on file  Food Insecurity: Not on file  Transportation Needs: Not on file  Physical Activity: Not on file  Stress: Not on file  Social Connections: Not on file  Intimate Partner Violence: Not on file    FAMILY HISTORY: Family History  Problem Relation Age of Onset   Heart disease Maternal Grandmother    Heart disease Maternal Grandfather    Stroke Maternal Grandfather    Colon cancer Maternal Grandfather    Breast cancer Neg Hx    Ovarian cancer Neg Hx    Endometrial cancer Neg Hx    Pancreatic cancer Neg Hx    Prostate cancer Neg Hx     ALLERGIES:  is allergic to augmentin [amoxicillin-pot clavulanate]  and ivp dye [iodinated contrast media].  MEDICATIONS:  Current Outpatient Medications  Medication Sig Dispense Refill   dexamethasone (DECADRON) 4 MG tablet Take 2 tabs at the night before and 2 tab the morning of chemotherapy, every 3 weeks, by mouth x 6 cycles 36 tablet 6   acetaminophen (TYLENOL) 500 MG tablet Take 500-1,000 mg by mouth every 6 (six) hours as needed (pain.).     allopurinol (ZYLOPRIM) 300 MG tablet Take 300 mg by mouth at bedtime.     amLODipine (NORVASC) 5 MG tablet Take 5 mg by mouth at bedtime.     atenolol (TENORMIN) 50 MG tablet Take 100 mg by mouth at bedtime.     atorvastatin (LIPITOR) 80 MG tablet Take 80 mg by mouth every other day. At night     cholecalciferol (VITAMIN D3) 25 MCG (1000 UNIT) tablet Take 2,000 Units by mouth at bedtime.     citalopram (CELEXA) 10 MG tablet Take 10 mg by mouth in the morning.     divalproex (DEPAKOTE) 250 MG DR tablet Take 750 mg by mouth in the morning.     furosemide (LASIX) 20 MG tablet Take 40 mg by mouth in the morning.     glipiZIDE (GLUCOTROL) 5 MG tablet Take 5 mg by mouth in the morning.     Lancets (ONETOUCH DELICA PLUS [id number]) MISC Apply topically daily.     levothyroxine (SYNTHROID) 50 MCG tablet Take 50 mcg by mouth daily before breakfast.     lidocaine-prilocaine (EMLA) cream Apply to affected area once 30 g 3   miconazole (BAZA ANTIFUNGAL) 2 % cream Apply 1 Application topically 2 (two) times daily as needed (skin irritation.).     ondansetron (ZOFRAN) 8 MG tablet Take 1 tablet (8 mg total) by mouth every 8 (eight) hours as needed for nausea or vomiting. Start on the third day after chemotherapy. 30 tablet 1   prochlorperazine (COMPAZINE) 10 MG tablet Take 1 tablet (10 mg total) by mouth every 6 (six) hours as needed for nausea or vomiting. 30 tablet 1   No current facility-administered medications for this visit.    REVIEW OF SYSTEMS:   Constitutional: Denies fevers, chills or abnormal night sweats Eyes:  Denies blurriness of vision, double vision or watery eyes Ears, nose, mouth, throat, and face: Denies mucositis or sore throat Respiratory: Denies cough, dyspnea or wheezes Cardiovascular: Denies palpitation, chest discomfort or  lower extremity swelling Gastrointestinal:  Denies nausea, heartburn or change in bowel habits Skin: Denies abnormal skin rashes Lymphatics: Denies new lymphadenopathy or easy bruising Neurological:Denies numbness, tingling or new weaknesses Behavioral/Psych: Mood is stable, no new changes  All other systems were reviewed with the patient and are negative.  PHYSICAL EXAMINATION: ECOG PERFORMANCE STATUS: 2 - Symptomatic, <50% confined to bed  Vitals:   05/30/22 1305  BP: (!) 144/46  Pulse: 61  Resp: 18  Temp: 97.8 F (36.6 C)  SpO2: 97%   Filed Weights   05/30/22 1305  Weight: 223 lb 12.8 oz (101.5 kg)    GENERAL:alert, no distress and comfortable SKIN: skin color, texture, turgor are normal, no rashes or significant lesions EYES: normal, conjunctiva are pink and non-injected, sclera clear OROPHARYNX:no exudate, no erythema and lips, buccal mucosa, and tongue normal  NECK: supple, thyroid normal size, non-tender, without nodularity LYMPH:  no palpable lymphadenopathy in the cervical, axillary or inguinal LUNGS: clear to auscultation and percussion with normal breathing effort HEART: regular rate & rhythm and no murmurs and no lower extremity edema ABDOMEN:abdomen soft, non-tender and normal bowel sounds.  Noted well-healed surgical scar Musculoskeletal:no cyanosis of digits and no clubbing  PSYCH: alert & oriented x 3 with fluent speech NEURO: no focal motor/sensory deficits  LABORATORY DATA:  I have reviewed the data as listed Lab Results  Component Value Date   WBC 9.5 05/02/2022   HGB 11.8 (L) 05/02/2022   HCT 36.4 05/02/2022   MCV 98.4 05/02/2022   PLT 233 05/02/2022   Recent Labs    05/02/22 0853 05/12/22 1208  NA 141 136  K 4.4 4.1   CL 104 100  CO2 27 30  GLUCOSE 95 54*  BUN 47* 45*  CREATININE 1.50* 1.43*  CALCIUM 9.6 9.1  GFRNONAA 35* 37*  PROT 7.0  --   ALBUMIN 3.6  --   AST 16  --   ALT 13  --   ALKPHOS 61  --   BILITOT 0.4  --

## 2022-05-31 NOTE — Progress Notes (Signed)
The pharmacy team has substituted IV diphenhydramine for IV cetirizine as a premedication. Patient will be monitored for hypersensitivity reaction and adverse reactions to IV cetirizine. Thanks.   Kennith Center, Pharm.D., CPP 05/31/2022'@2'$ :49 PM

## 2022-05-31 NOTE — Assessment & Plan Note (Signed)
We discussed risk factors for worsening kidney failure We discussed importance of adequate hydration

## 2022-05-31 NOTE — Assessment & Plan Note (Signed)
We discussed risk of worsening diabetes control while on treatment and risk of worsening peripheral neuropathy I will prescribe upfront dose reduction

## 2022-05-31 NOTE — Assessment & Plan Note (Signed)
We discussed the role of adjuvant treatment Due to her pre-existing severe peripheral neuropathy from diabetes and baseline chronic renal failure, I recommend upfront dose reduction of paclitaxel We will adjust the dose of carboplatin accordingly I will schedule chemo education class and port placement  We reviewed the NCCN guidelines We discussed the role of chemotherapy. The intent is of curative intent.  We discussed some of the risks, benefits, side-effects of carboplatin & Taxol. Treatment is intravenous, every 3 weeks x 6 cycles  Some of the short term side-effects included, though not limited to, including weight loss, life threatening infections, risk of allergic reactions, need for transfusions of blood products, nausea, vomiting, change in bowel habits, loss of hair, admission to hospital for various reasons, and risks of death.   Long term side-effects are also discussed including risks of infertility, permanent damage to nerve function, hearing loss, chronic fatigue, kidney damage with possibility needing hemodialysis, and rare secondary malignancy including bone marrow disorders.  The patient is aware that the response rates discussed earlier is not guaranteed.  After a long discussion, patient made an informed decision to proceed with the prescribed plan of care.   Patient education material was dispensed. We discussed premedication with dexamethasone before chemotherapy.

## 2022-06-01 NOTE — Patient Instructions (Signed)
It was good to see you today. Remember, no lifting more than 10 pounds until 6-8 weeks after surgery and nothing in the vagina for 8-10 weeks.   I will see you after you have finished chemotherapy with Dr. Alvy Bimler.

## 2022-06-01 NOTE — Progress Notes (Unsigned)
Gynecologic Oncology Return Clinic Visit  06/02/22  Reason for Visit: follow-up after surgery, treatment planning  Treatment History: Oncology History Overview Note  HER2 negative, High grade serous   Fallopian tube cancer, carcinoma, left ([place])  03/29/2022 Imaging   MRI of the abdomen and pelvis reveals a left adnexal/ovarian mass measuring 5.5 x 2.8 x 3.7 cm with solid diffuse enhancement and T2 signal characteristics and intermediate to accentuated T1 signal characteristics.  No significant cystic component or ascites.  Renal lesion of the right kidney lower pole is thought to represent a benign lesion.  1.3 cm cystic lesion of the head of the pancreas is recommended to be follow-up with pancreatic MRI in 2 years.   05/02/2022 Tumor Marker   Patient's tumor was tested for the following markers: CA-125. Results of the tumor marker test revealed 69.   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 Fullness appreciated within the left cul de sac. On intra-abdominal entry, normal upper abdominal survey including liver edge, diaphragm, and stomach. Normal appearing omentum. No ascites. Normal appearing small bowel. Significant inflammation within the pelvis concerning initially for recent diverticulitis versus endometriosis. Thickened and inflamed pelvic peritoneum. Filmy adhesions between the anterior cul de sac and uterus, between the right adnexa and and the sigmoid colon. Right adnexa inflammed but otherwise normal in appearing. Left fallopian tube dilated with evidence of hydrosalpinx. Friable adhesions of the fallopian tube to the sigmoid mesentery, left ovary and left broad ligament. No ascites. Uterus bulbous and 10cm with findings of endosalpingiosis versus endometriosis on much  of the uterine serosa. Dense adhesions between the bladder and cervix. Cul de sac without evidence of disease. No ascites.  Cystoscopy findings: bladder dome intact, normal efflux from bilateral ureteral orifices. On frozen section, no abnormality of the endometrium. Anterior cul de sac adhesion with findings of hemosiderin, no definitive malignancy. Fallopian tube with malignancy.  Given findings and pelvic peritoneal involvement given fallopian tube adherent to surrounding structures, no upper abdominal findings, normal omentum, and normal neuroassessment intraoperatively as well as on preoperative imaging, lymphadenectomy deferred.   05/10/2022 Pathology Results   Procedure: Hysterectomy with bilateral salpingo-oophorectomy  Specimen Integrity: Intact  Tumor Site: Left fallopian tube  Tumor Size: 6.3 x 3.8 x 2.8 cm  Histologic Type: Serous carcinoma  Histologic Grade: High-grade  Ovarian Surface Involvement: Not identified  Fallopian Tube Surface Involvement: Not identified  Implants (required for advanced stage serous/seromucinous borderline  tumors only): Not applicable  Other Tissue/ Organ Involvement: Not applicable  Largest Extrapelvic Peritoneal Focus: Not applicable  Peritoneal/Ascitic Fluid Involvement: Not identified [facility] [id number])  Chemotherapy Response Score (CRS): Not applicable, no known presurgical  therapy  Regional Lymph Nodes: Not applicable (no lymph nodes submitted or found)  Distant Metastasis: Not applicable  Pathologic Stage Classification (pTNM, AJCC 8th Edition): pT1a, pN n/a  Ancillary Studies: Available upon request  Representative Tumor Block: B15  Comment(s): An immunohistochemistry stain for p53 is performed with  adequate control and is diffusely positive within the tumor.  The same  stain shows patchy variable staining within the reactive mesothelium  lining the serosal adhesions within the anterior cul-de-sac    05/17/2022 Initial Diagnosis   Fallopian  tube cancer, carcinoma, left ([place])   05/30/2022 Cancer Staging   Staging form: Ovary, Fallopian Tube, and Primary Peritoneal Carcinoma, AJCC 8th Edition -  Pathologic stage from 05/30/2022: FIGO Stage IIB (pT2b, pN0, cM0) - Signed by Heath Lark, MD on 05/30/2022 Stage prefix: Initial diagnosis   06/10/2022 -  Chemotherapy   Patient is on Treatment Plan : OVARIAN Carboplatin (AUC 6) + Paclitaxel (175) q21d X 6 Cycles       Interval History: Doing well.  Denies any significant abdominal or pelvic pain.  Had an episode of blood in the toilet when she voided yesterday morning.  Had no further bleeding yesterday.  Had a small, dime sized spot on her pad this morning.  Otherwise denies bleeding or discharge.  Reports baseline bowel bladder function.  Scheduled to start chemotherapy next week.  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[facility])    Past Surgical History:  Procedure Laterality Date   BREAST BIOPSY Left    BREAST BIOPSY Left    DILATION AND CURETTAGE OF UTERUS     DILITATION & CURRETTAGE/HYSTROSCOPY WITH VERSAPOINT RESECTION N/A 12/26/2012   Procedure: DILATATION & CURETTAGE/HYSTEROSCOPY WITH VERSAPOINT RESECTION;  Surgeon: Princess Bruins, MD;  Location: [facility];  Service: Gynecology;  Laterality: N/A;   JOINT REPLACEMENT     MANDIBLE FRACTURE SURGERY     ROBOTIC ASSISTED BILATERAL SALPINGO  OOPHERECTOMY Bilateral 05/10/2022   Procedure: XI ROBOTIC ASSISTED BILATERAL SALPINGO OOPHORECTOMY, TOTAL HYSTERECTOMY, CYSTOSCOPY;  Surgeon: Lafonda Mosses, MD;  Location: [facility];  Service: Gynecology;  Laterality: Bilateral;   TOTAL KNEE ARTHROPLASTY Bilateral     Family History  Problem Relation Age of Onset   Heart disease Maternal Grandmother    Heart disease Maternal Grandfather    Stroke Maternal Grandfather    Colon cancer Maternal Grandfather    Breast cancer Neg Hx    Ovarian cancer Neg Hx    Endometrial cancer Neg Hx    Pancreatic cancer Neg Hx    Prostate cancer Neg Hx     Social History   Socioeconomic History   Marital status: Divorced    Spouse name: Not on file   Number of children: Not on file   Years of education: Not on file   Highest education level: Not on file  Occupational History   Not on file  Tobacco Use   Smoking status: Former   Smokeless tobacco: Never  Vaping Use   Vaping Use: Never used  Substance and Sexual Activity   Alcohol use: Yes    Comment: rare   Drug use: No   Sexual activity: Not Currently  Other Topics Concern   Not on file  Social History Narrative   ** Merged History Encounter **       Social Determinants of Health   Financial Resource Strain: Not on file  Food Insecurity: Not on file  Transportation Needs: Not on file  Physical Activity: Not on file  Stress: Not on file  Social Connections: Not on file    Current Medications:  Current Outpatient Medications:    acetaminophen (TYLENOL) 500 MG tablet, Take 500-1,000 mg by mouth every 6 (six) hours as needed (pain.)., Disp: , Rfl:    allopurinol (ZYLOPRIM) 300 MG tablet, Take 300 mg by mouth at bedtime., Disp: , Rfl:    amLODipine (NORVASC) 5 MG tablet, Take 5 mg by mouth at bedtime., Disp: , Rfl:    atenolol (TENORMIN) 50 MG tablet, Take 100 mg by mouth at bedtime., Disp: , Rfl:    atorvastatin (LIPITOR) 80 MG tablet, Take 80 mg by mouth every other day. At  night, Disp: , Rfl:    cholecalciferol (VITAMIN D3) 25 MCG (1000 UNIT) tablet, Take 2,000 Units by mouth at bedtime., Disp: , Rfl:    citalopram (CELEXA) 10 MG tablet, Take 10 mg by mouth in the morning., Disp: , Rfl:    divalproex (DEPAKOTE) 250 MG DR tablet, Take 750 mg by mouth in the morning., Disp: , Rfl:    furosemide (LASIX) 20 MG tablet, Take 40 mg by mouth in the morning., Disp: , Rfl:    glipiZIDE (GLUCOTROL) 5 MG tablet, Take 5 mg by mouth in the morning., Disp: , Rfl:    Lancets (ONETOUCH DELICA PLUS [id number]) MISC, Apply topically daily., Disp: , Rfl:    levothyroxine (SYNTHROID) 50 MCG tablet, Take 50 mcg by mouth daily before breakfast., Disp: , Rfl:    miconazole (BAZA ANTIFUNGAL) 2 % cream, Apply 1 Application topically 2 (two) times daily as needed (skin irritation.)., Disp: , Rfl:    dexamethasone (DECADRON) 4 MG tablet, Take 2 tabs at the night before and 2 tab the morning of chemotherapy, every 3 weeks, by mouth x 6 cycles (Patient not taking: Reported on 05/31/2022), Disp: 36 tablet, Rfl: 6   lidocaine-prilocaine (EMLA) cream, Apply to affected area once (Patient not taking: Reported on 05/31/2022), Disp: 30 g, Rfl: 3   ondansetron (ZOFRAN) 8 MG tablet, Take 1 tablet (8 mg total) by mouth every 8 (eight) hours as needed for nausea or vomiting. Start on the third day after chemotherapy. (Patient not taking: Reported on 05/31/2022), Disp: 30 tablet, Rfl: 1   prochlorperazine (COMPAZINE) 10 MG tablet, Take 1 tablet (10 mg total) by mouth every 6 (six) hours as needed for nausea or vomiting. (Patient not taking: Reported on 05/31/2022), Disp: 30 tablet, Rfl: 1  Review of Systems: Denies appetite changes, fevers, chills, fatigue, unexplained weight changes. Denies hearing loss, neck lumps or masses, mouth sores, ringing in ears or voice changes. Denies cough or wheezing.  Denies shortness of breath. Denies chest pain or palpitations. Denies leg swelling. Denies abdominal distention,  pain, blood in stools, constipation, diarrhea, nausea, vomiting, or early satiety. Denies pain with intercourse, dysuria, frequency, hematuria or incontinence. Denies hot flashes, pelvic pain, vaginal bleeding or vaginal discharge.   Denies joint pain, back pain or muscle pain/cramps. Denies itching, rash, or wounds. Denies dizziness, headaches, numbness or seizures. Denies swollen lymph nodes or glands, denies easy bruising or bleeding. Denies anxiety, depression, confusion, or decreased concentration.  Physical Exam: BP (!) 148/51 (BP Location: Left Arm, Patient Position: Sitting) Comment: MD notified, meds taken  Pulse 66   Temp 97.7 F (36.5 C) (Oral)   Resp 20   Ht $R'5\' 3"'Ic$  (1.6 m)   Wt 221 lb (100.2 kg)   BMI 39.15 kg/m  General: Alert, oriented, no acute distress. HEENT: Normocephalic, atraumatic, sclera anicteric. Chest: Unlabored breathing on room air. Abdomen: Obese,  soft, nontender.  Normoactive bowel sounds.  No masses or hepatosplenomegaly appreciated.  Well-healed incisions, remaining Dermabond removed. Extremities: Grossly normal range of motion.  Warm, well perfused.  Trace edema bilaterally. GU: Normal appearing external genitalia without erythema, excoriation, or lesions.  Speculum exam reveals cuff intact, suture visible, no blood within the vaginal vault.  Bimanual exam reveals intact, no fluctuance or tenderness to palpation.   Laboratory & Radiologic Studies: None new  Assessment & Plan: Sarah Bishop is a 80 y.o. 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   Patient is doing well, continues to meet postoperative milestones.  Discussed continued expectations.  She has had 2 episodes of vaginal bleeding, incision is intact with suture visible today.  Discussed importance of not doing any heavy lifting, which she has done over the last week.  Discussed lifting restrictions are in  place until 6 weeks after surgery.  Encouraged her to call if she has any additional bleeding.   She is scheduled to see genetic counselor on 10/16.  Cycle #1 of adjuvant chemotherapy starts 9/8.  Reviewed with her again findings from surgery as well as pathology.  Given adhesions between the fallopian tube and surrounding peritoneal structures, staging her as a IIB.  Patient was given a copy of her pathology report today which we discussed in detail together.  She is aware that she will return to see me for follow-up after she finishes adjuvant chemotherapy.  22 minutes of total time was spent for this patient encounter, including preparation, face-to-face counseling with the patient and coordination of care, and documentation of the encounter.  Jeral Pinch, MD  Division of Gynecologic Oncology  Department of Obstetrics and Gynecology  [facility]

## 2022-06-02 ENCOUNTER — Encounter: Payer: Self-pay | Admitting: Gynecologic Oncology

## 2022-06-02 ENCOUNTER — Inpatient Hospital Stay (HOSPITAL_BASED_OUTPATIENT_CLINIC_OR_DEPARTMENT_OTHER): Payer: Medicare Other | Admitting: Gynecologic Oncology

## 2022-06-02 ENCOUNTER — Other Ambulatory Visit: Payer: Self-pay

## 2022-06-02 VITALS — BP 148/51 | HR 66 | Temp 97.7°F | Resp 20 | Ht 63.0 in | Wt 221.0 lb

## 2022-06-02 DIAGNOSIS — Z90722 Acquired absence of ovaries, bilateral: Secondary | ICD-10-CM

## 2022-06-02 DIAGNOSIS — Z90710 Acquired absence of both cervix and uterus: Secondary | ICD-10-CM

## 2022-06-02 DIAGNOSIS — C5702 Malignant neoplasm of left fallopian tube: Secondary | ICD-10-CM

## 2022-06-02 DIAGNOSIS — Z7189 Other specified counseling: Secondary | ICD-10-CM

## 2022-06-03 ENCOUNTER — Inpatient Hospital Stay: Payer: Medicare Other | Attending: Gynecologic Oncology

## 2022-06-03 ENCOUNTER — Other Ambulatory Visit: Payer: Self-pay

## 2022-06-03 DIAGNOSIS — C5702 Malignant neoplasm of left fallopian tube: Secondary | ICD-10-CM | POA: Insufficient documentation

## 2022-06-03 DIAGNOSIS — Z5111 Encounter for antineoplastic chemotherapy: Secondary | ICD-10-CM | POA: Insufficient documentation

## 2022-06-08 ENCOUNTER — Other Ambulatory Visit: Payer: Self-pay | Admitting: Student

## 2022-06-08 DIAGNOSIS — Z419 Encounter for procedure for purposes other than remedying health state, unspecified: Secondary | ICD-10-CM

## 2022-06-09 ENCOUNTER — Encounter (HOSPITAL_COMMUNITY): Payer: Self-pay

## 2022-06-09 ENCOUNTER — Ambulatory Visit (HOSPITAL_COMMUNITY)
Admission: RE | Admit: 2022-06-09 | Discharge: 2022-06-09 | Disposition: A | Discharge status: Home / Self Care | Payer: Medicare Other | Source: Ambulatory Visit | Attending: Hematology and Oncology | Admitting: Hematology and Oncology

## 2022-06-09 ENCOUNTER — Encounter: Payer: Self-pay | Admitting: Hematology and Oncology

## 2022-06-09 DIAGNOSIS — Z7982 Long term (current) use of aspirin: Secondary | ICD-10-CM | POA: Insufficient documentation

## 2022-06-09 DIAGNOSIS — I10 Essential (primary) hypertension: Secondary | ICD-10-CM | POA: Diagnosis not present

## 2022-06-09 DIAGNOSIS — E785 Hyperlipidemia, unspecified: Secondary | ICD-10-CM | POA: Diagnosis not present

## 2022-06-09 DIAGNOSIS — G40909 Epilepsy, unspecified, not intractable, without status epilepticus: Secondary | ICD-10-CM | POA: Insufficient documentation

## 2022-06-09 DIAGNOSIS — E039 Hypothyroidism, unspecified: Secondary | ICD-10-CM | POA: Diagnosis not present

## 2022-06-09 DIAGNOSIS — E1151 Type 2 diabetes mellitus with diabetic peripheral angiopathy without gangrene: Secondary | ICD-10-CM | POA: Insufficient documentation

## 2022-06-09 DIAGNOSIS — G4730 Sleep apnea, unspecified: Secondary | ICD-10-CM | POA: Diagnosis not present

## 2022-06-09 DIAGNOSIS — C5702 Malignant neoplasm of left fallopian tube: Secondary | ICD-10-CM | POA: Insufficient documentation

## 2022-06-09 DIAGNOSIS — Z90722 Acquired absence of ovaries, bilateral: Secondary | ICD-10-CM | POA: Insufficient documentation

## 2022-06-09 DIAGNOSIS — Z419 Encounter for procedure for purposes other than remedying health state, unspecified: Secondary | ICD-10-CM

## 2022-06-09 DIAGNOSIS — N9489 Other specified conditions associated with female genital organs and menstrual cycle: Secondary | ICD-10-CM

## 2022-06-09 DIAGNOSIS — R7989 Other specified abnormal findings of blood chemistry: Secondary | ICD-10-CM

## 2022-06-09 HISTORY — PX: IR IMAGING GUIDED PORT INSERTION: IMG5740

## 2022-06-09 LAB — BASIC METABOLIC PANEL
Anion gap: 10 (ref 5–15)
BUN: 48 mg/dL — ABNORMAL HIGH (ref 8–23)
CO2: 28 mmol/L (ref 22–32)
Calcium: 9.5 mg/dL (ref 8.9–10.3)
Chloride: 105 mmol/L (ref 98–111)
Creatinine, Ser: 1.42 mg/dL — ABNORMAL HIGH (ref 0.44–1.00)
GFR, Estimated: 37 mL/min — ABNORMAL LOW (ref >60)
Glucose, Bld: 56 mg/dL — ABNORMAL LOW (ref 70–99)
Potassium: 3.7 mmol/L (ref 3.5–5.1)
Sodium: 143 mmol/L (ref 135–145)

## 2022-06-09 LAB — GLUCOSE, CAPILLARY
Glucose-Capillary: 52 mg/dL — ABNORMAL LOW (ref 70–99)
Glucose-Capillary: 166 mg/dL — ABNORMAL HIGH (ref 70–99)

## 2022-06-09 MED ORDER — DEXTROSE 50 % IV SOLN
25.0000 g | INTRAVENOUS | Status: AC
  Administered 2022-06-09: 25 g via INTRAVENOUS

## 2022-06-09 MED ORDER — DEXTROSE 50 % IV SOLN
INTRAVENOUS | Status: AC
  Filled 2022-06-09: qty 50

## 2022-06-09 MED ORDER — MIDAZOLAM HCL 2 MG/2ML IJ SOLN
INTRAMUSCULAR | Status: AC
  Filled 2022-06-09: qty 2

## 2022-06-09 MED ORDER — FENTANYL CITRATE (PF) 100 MCG/2ML IJ SOLN
INTRAMUSCULAR | Status: AC
  Filled 2022-06-09: qty 2

## 2022-06-09 MED ORDER — LIDOCAINE HCL 1 % IJ SOLN
INTRAMUSCULAR | Status: AC
  Filled 2022-06-09: qty 20

## 2022-06-09 MED ORDER — FENTANYL CITRATE (PF) 100 MCG/2ML IJ SOLN
INTRAMUSCULAR | Status: AC | PRN
  Administered 2022-06-09: 50 ug via INTRAVENOUS

## 2022-06-09 MED ORDER — LIDOCAINE-EPINEPHRINE 1 %-1:100000 IJ SOLN
INTRAMUSCULAR | Status: AC
  Filled 2022-06-09: qty 1

## 2022-06-09 MED ORDER — SODIUM CHLORIDE 0.9 % IV SOLN: INTRAVENOUS | Status: DC

## 2022-06-09 MED ORDER — MIDAZOLAM HCL 2 MG/2ML IJ SOLN
INTRAMUSCULAR | Status: AC | PRN
  Administered 2022-06-09: .5 mg via INTRAVENOUS

## 2022-06-09 MED ORDER — LIDOCAINE HCL (PF) 1 % IJ SOLN
INTRAMUSCULAR | Status: AC | PRN
  Administered 2022-06-09: 5 mL

## 2022-06-09 MED ORDER — HEPARIN SOD (PORK) LOCK FLUSH 100 UNIT/ML IV SOLN
INTRAVENOUS | Status: AC
  Filled 2022-06-09: qty 5

## 2022-06-09 MED ORDER — HEPARIN SOD (PORK) LOCK FLUSH 100 UNIT/ML IV SOLN
INTRAVENOUS | Status: AC | PRN
  Administered 2022-06-09: 500 [IU] via INTRAVENOUS

## 2022-06-09 MED ORDER — LIDOCAINE-EPINEPHRINE 1 %-1:100000 IJ SOLN
INTRAMUSCULAR | Status: AC | PRN
  Administered 2022-06-09: 20 mL

## 2022-06-09 NOTE — Discharge Instructions (Signed)
Urgent needs - Interventional Radiology on call MD [phone]  Wound - May remove dressing and shower in 24 to 48 hours.  Keep site clean and dry.  Replace with bandaid as needed.  Do not submerge in tub or water until site healing well. If closed with glue, glue will flake off on its own.  If ordered by your provider, may start Emla cream in 2 weeks or after incision is healed.  After completion of treatment, your provider should have you set up for monthly port flushes.   

## 2022-06-09 NOTE — Procedures (Signed)
Interventional Radiology Procedure:   Indications: Left fallopian tube cancer  Procedure: Port placement  Findings: Right jugular port, tip at SVC/RA junction  Complications: None     EBL: Minimal, less than 10 ml  Plan: Discharge in one hour.  Keep port site and incisions dry for at least 24 hours.     Rosalva Neary R. Anselm Pancoast, MD  Pager: [phone]

## 2022-06-09 NOTE — Consult Note (Signed)
Chief Complaint: Patient was seen in consultation today for Port-A-Cath placement  Referring Physician(s): Gorsuch,Ni  Supervising Physician: Markus Daft  Patient Status: [facility] - Out-pt  History of Present Illness: MORNING HALBERG is an 81 y.o. female with past medical history of arthritis, diabetes,, seizure disorder, hyperlipidemia, hypertension, hypothyroidism, peripheral vascular disease, sleep apnea who presents now with newly diagnosed left fallopian tube carcinoma, status post hysterectomy with BSO on 05/10/2022. She is scheduled today for Port-A-Cath placement to assist with treatment.  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[facility])    Past Surgical History:  Procedure Laterality Date   BREAST BIOPSY Left    BREAST BIOPSY Left    DILATION AND CURETTAGE OF UTERUS     DILITATION & CURRETTAGE/HYSTROSCOPY WITH VERSAPOINT RESECTION N/A 12/26/2012   Procedure: DILATATION & CURETTAGE/HYSTEROSCOPY WITH VERSAPOINT RESECTION;  Surgeon: MPrincess Bruins MD;  [facility];  Service: Gynecology;  Laterality: N/A;   JOINT REPLACEMENT     MANDIBLE FRACTURE SURGERY     ROBOTIC ASSISTED BILATERAL SALPINGO OOPHERECTOMY Bilateral 05/10/2022   Procedure: XI ROBOTIC ASSISTED BILATERAL SALPINGO OOPHORECTOMY, TOTAL HYSTERECTOMY, CYSTOSCOPY;  Surgeon: TLafonda Mosses MD;  Location: [facility];  Service: Gynecology;  Laterality: Bilateral;    TOTAL KNEE ARTHROPLASTY Bilateral     Allergies: Augmentin [amoxicillin-pot clavulanate] and Ivp dye [iodinated contrast media]  Medications: Prior to Admission medications   Medication Sig Start Date End Date Taking? Authorizing Provider  glipiZIDE (GLUCOTROL) 5 MG tablet Take 5 mg by mouth in the morning. 02/12/21  Yes [provider]  acetaminophen (TYLENOL) 500 MG tablet Take 500-1,000 mg by mouth every 6 (six) hours as needed (pain.).    [provider]  allopurinol (ZYLOPRIM) 300 MG tablet Take 300 mg by mouth at bedtime.    [provider]  amLODipine (NORVASC) 5 MG tablet Take 5 mg by mouth at bedtime. 03/21/22   [provider]  atenolol (TENORMIN) 50 MG tablet Take 100 mg by mouth at bedtime.    [provider]  atorvastatin (LIPITOR) 80 MG tablet Take 80 mg by mouth every other day. At night    [provider]  cholecalciferol (VITAMIN D3) 25 MCG (1000 UNIT) tablet Take 2,000 Units by mouth at bedtime.    [provider]  citalopram (CELEXA) 10 MG tablet Take 10 mg by mouth in the morning.    [provider]  dexamethasone (DECADRON) 4 MG tablet Take 2 tabs at the night before and 2 tab the morning of chemotherapy, every 3 weeks, by mouth x 6 cycles Patient not taking: Reported on 05/31/2022 05/30/22   GHeath Lark MD  divalproex (DEPAKOTE) 250 MG DR tablet Take 750 mg by mouth in the morning.    [provider]  furosemide (LASIX) 20 MG tablet Take 40 mg by mouth in the morning.  [provider]  Lancets [facility] DELICA PLUS [id number]) MISC Apply topically daily. 01/27/21   [provider]  levothyroxine (SYNTHROID) 50 MCG tablet Take 50 mcg by mouth daily before breakfast. 02/10/21   [provider]  lidocaine-prilocaine (EMLA) cream Apply to affected area once Patient not taking: Reported on 05/31/2022 05/30/22   Heath Lark, MD  miconazole (BAZA ANTIFUNGAL) 2 % cream Apply  1 Application topically 2 (two) times daily as needed (skin irritation.).    [provider]  ondansetron (ZOFRAN) 8 MG tablet Take 1 tablet (8 mg total) by mouth every 8 (eight) hours as needed for nausea or vomiting. Start on the third day after chemotherapy. Patient not taking: Reported on 05/31/2022 05/30/22   Heath Lark, MD  prochlorperazine (COMPAZINE) 10 MG tablet Take 1 tablet (10 mg total) by mouth every 6 (six) hours as needed for nausea or vomiting. Patient not taking: Reported on 05/31/2022 05/30/22   Heath Lark, MD     Family History  Problem Relation Age of Onset   Heart disease Maternal Grandmother    Heart disease Maternal Grandfather    Stroke Maternal Grandfather    Colon cancer Maternal Grandfather    Breast cancer Neg Hx    Ovarian cancer Neg Hx    Endometrial cancer Neg Hx    Pancreatic cancer Neg Hx    Prostate cancer Neg Hx     Social History   Socioeconomic History   Marital status: Divorced    Spouse name: Not on file   Number of children: Not on file   Years of education: Not on file   Highest education level: Not on file  Occupational History   Not on file  Tobacco Use   Smoking status: Former   Smokeless tobacco: Never  Vaping Use   Vaping Use: Never used  Substance and Sexual Activity   Alcohol use: Yes    Comment: rare   Drug use: No   Sexual activity: Not Currently  Other Topics Concern   Not on file  Social History Narrative   ** Merged History Encounter **       Social Determinants of Health   Financial Resource Strain: Not on file  Food Insecurity: Not on file  Transportation Needs: Not on file  Physical Activity: Not on file  Stress: Not on file  Social Connections: Not on file      Review of Systems denies fever, headache, chest pain, dyspnea, cough, abdominal/back pain, nausea, vomiting   Vital Signs: BP (!) 149/59 (BP Location: Right Arm)   Pulse (!) 58   Temp 98.1 F (36.7 C) (Oral)   Resp 16   SpO2 96%       Physical Exam awake, alert.  Chest clear to auscultation bilaterally anteriorly.  Heart with slightly bradycardic but regular rhythm.  Abdomen obese, soft, positive bowel sounds, nontender.  Bilateral lower extremity edema noted.  Imaging: No results found.  Labs:  CBC: Recent Labs    05/02/22 0853  WBC 9.5  HGB 11.8*  HCT 36.4  PLT 233    COAGS: No results for input(s): "INR", "APTT" in the last 8760 hours.  BMP: Recent Labs    05/02/22 0853 05/12/22 1208  NA 141 136  K 4.4 4.1  CL 104 100  CO2 27 30  GLUCOSE 95 54*  BUN 47* 45*  CALCIUM 9.6 9.1  CREATININE 1.50* 1.43*  GFRNONAA 35* 37*    LIVER FUNCTION TESTS: Recent Labs    05/02/22  0853  BILITOT 0.4  AST 16  ALT 13  ALKPHOS 61  PROT 7.0  ALBUMIN 3.6    TUMOR MARKERS: No results for input(s): "AFPTM", "CEA", "CA199", "CHROMGRNA" in the last 8760 hours.  Assessment and Plan: 81 y.o. female with past medical history of arthritis, diabetes,, seizure disorder, hyperlipidemia, hypertension, hypothyroidism, peripheral vascular disease, sleep apnea who presents now with newly diagnosed left fallopian tube carcinoma, status post hysterectomy with BSO on 05/10/2022. She is scheduled today for Port-A-Cath placement to assist with treatment.Risks and benefits of image guided port-a-catheter placement was discussed with the patient including, but not limited to bleeding, infection, pneumothorax, or fibrin sheath development and need for additional procedures.  All of the patient's questions were answered, patient is agreeable to proceed. Consent signed and in chart.  CBG today was 52;  1 amp D50 given.  Thank you for this interesting consult.  I greatly enjoyed meeting JOLYNN BAJOREK and look forward to participating in their care.  A copy of this report was sent to the requesting provider on this date.  Electronically Signed: D. Rowe Robert, PA-C 06/09/2022, 12:39 PM   I spent a total of  25 minutes    in face to face in clinical consultation, greater than 50% of which was counseling/coordinating care for Port-A-Cath placement

## 2022-06-09 NOTE — Progress Notes (Signed)
Called pt to introduce myself as her Financial Resource Specialist and to discuss the [employer] grant.  I left a msg requesting she return my call if she's interested in applying for the grant.  ?

## 2022-06-10 ENCOUNTER — Inpatient Hospital Stay: Payer: Medicare Other

## 2022-06-10 ENCOUNTER — Other Ambulatory Visit: Payer: Medicare Other

## 2022-06-10 ENCOUNTER — Other Ambulatory Visit: Payer: Self-pay

## 2022-06-10 VITALS — BP 123/56 | HR 50 | Temp 97.7°F | Resp 18 | Wt 219.5 lb

## 2022-06-10 DIAGNOSIS — C5702 Malignant neoplasm of left fallopian tube: Secondary | ICD-10-CM

## 2022-06-10 DIAGNOSIS — Z5111 Encounter for antineoplastic chemotherapy: Secondary | ICD-10-CM | POA: Diagnosis not present

## 2022-06-10 LAB — CMP (CANCER CENTER ONLY)
ALT: 9 U/L (ref 0–44)
AST: 12 U/L — ABNORMAL LOW (ref 15–41)
Albumin: 3.8 g/dL (ref 3.5–5.0)
Alkaline Phosphatase: 54 U/L (ref 38–126)
Anion gap: 9 (ref 5–15)
BUN: 45 mg/dL — ABNORMAL HIGH (ref 8–23)
CO2: 26 mmol/L (ref 22–32)
Calcium: 9.7 mg/dL (ref 8.9–10.3)
Chloride: 102 mmol/L (ref 98–111)
Creatinine: 1.23 mg/dL — ABNORMAL HIGH (ref 0.44–1.00)
GFR, Estimated: 44 mL/min — ABNORMAL LOW (ref >60)
Glucose, Bld: 221 mg/dL — ABNORMAL HIGH (ref 70–99)
Potassium: 4.2 mmol/L (ref 3.5–5.1)
Sodium: 137 mmol/L (ref 135–145)
Total Bilirubin: 0.4 mg/dL (ref 0.3–1.2)
Total Protein: 7.1 g/dL (ref 6.5–8.1)

## 2022-06-10 LAB — CBC WITH DIFFERENTIAL (CANCER CENTER ONLY)
Abs Immature Granulocytes: 0.22 10*3/uL — ABNORMAL HIGH (ref 0.00–0.07)
Basophils Absolute: 0 10*3/uL (ref 0.0–0.1)
Basophils Relative: 0 %
Eosinophils Absolute: 0 10*3/uL (ref 0.0–0.5)
Eosinophils Relative: 0 %
HCT: 32.9 % — ABNORMAL LOW (ref 36.0–46.0)
Hemoglobin: 11 g/dL — ABNORMAL LOW (ref 12.0–15.0)
Immature Granulocytes: 2 %
Lymphocytes Relative: 13 %
Lymphs Abs: 1.4 10*3/uL (ref 0.7–4.0)
MCH: 31.2 pg (ref 26.0–34.0)
MCHC: 33.4 g/dL (ref 30.0–36.0)
MCV: 93.2 fL (ref 80.0–100.0)
Monocytes Absolute: 0.1 10*3/uL (ref 0.1–1.0)
Monocytes Relative: 1 %
Neutro Abs: 9.1 10*3/uL — ABNORMAL HIGH (ref 1.7–7.7)
Neutrophils Relative %: 84 %
Platelet Count: 200 10*3/uL (ref 150–400)
RBC: 3.53 MIL/uL — ABNORMAL LOW (ref 3.87–5.11)
RDW: 14.6 % (ref 11.5–15.5)
WBC Count: 10.9 10*3/uL — ABNORMAL HIGH (ref 4.0–10.5)
nRBC: 0 % (ref 0.0–0.2)

## 2022-06-10 LAB — GLUCOSE, CAPILLARY: Glucose-Capillary: 73 mg/dL (ref 70–99)

## 2022-06-10 MED ORDER — SODIUM CHLORIDE 0.9% FLUSH
10.0000 mL | INTRAVENOUS | Status: DC | PRN
  Administered 2022-06-10: 10 mL

## 2022-06-10 MED ORDER — HEPARIN SOD (PORK) LOCK FLUSH 100 UNIT/ML IV SOLN
500.0000 [IU] | Freq: Once | INTRAVENOUS | Status: AC | PRN
  Administered 2022-06-10: 500 [IU]

## 2022-06-10 MED ORDER — FAMOTIDINE IN NACL 20-0.9 MG/50ML-% IV SOLN
20.0000 mg | Freq: Once | INTRAVENOUS | Status: AC
  Administered 2022-06-10: 20 mg via INTRAVENOUS
  Filled 2022-06-10: qty 50

## 2022-06-10 MED ORDER — SODIUM CHLORIDE 0.9 % IV SOLN
489.0000 mg | Freq: Once | INTRAVENOUS | Status: AC
  Administered 2022-06-10: 490 mg via INTRAVENOUS
  Filled 2022-06-10: qty 49

## 2022-06-10 MED ORDER — SODIUM CHLORIDE 0.9% FLUSH
10.0000 mL | Freq: Once | INTRAVENOUS | Status: AC
  Administered 2022-06-10: 10 mL

## 2022-06-10 MED ORDER — SODIUM CHLORIDE 0.9 % IV SOLN
10.0000 mg | Freq: Once | INTRAVENOUS | Status: AC
  Administered 2022-06-10: 10 mg via INTRAVENOUS
  Filled 2022-06-10: qty 10

## 2022-06-10 MED ORDER — CETIRIZINE HCL 10 MG/ML IV SOLN
10.0000 mg | Freq: Once | INTRAVENOUS | Status: AC
  Administered 2022-06-10: 10 mg via INTRAVENOUS
  Filled 2022-06-10: qty 1

## 2022-06-10 MED ORDER — SODIUM CHLORIDE 0.9 % IV SOLN: Freq: Once | INTRAVENOUS | Status: AC

## 2022-06-10 MED ORDER — SODIUM CHLORIDE 0.9 % IV SOLN
105.0000 mg/m2 | Freq: Once | INTRAVENOUS | Status: AC
  Administered 2022-06-10: 222 mg via INTRAVENOUS
  Filled 2022-06-10: qty 37

## 2022-06-10 MED ORDER — SODIUM CHLORIDE 0.9 % IV SOLN
150.0000 mg | Freq: Once | INTRAVENOUS | Status: AC
  Administered 2022-06-10: 150 mg via INTRAVENOUS
  Filled 2022-06-10: qty 150

## 2022-06-10 MED ORDER — PALONOSETRON HCL INJECTION 0.25 MG/5ML
0.2500 mg | Freq: Once | INTRAVENOUS | Status: AC
  Administered 2022-06-10: 0.25 mg via INTRAVENOUS
  Filled 2022-06-10: qty 5

## 2022-06-10 NOTE — Patient Instructions (Signed)
[facility]  Discharge Instructions: Thank you for choosing [facility] to provide your oncology and hematology care.   If you have a lab appointment with the [facility], please go directly to the [facility] and check in at the registration area.   Wear comfortable clothing and clothing appropriate for easy access to any Portacath or PICC line.   We strive to give you quality time with your provider. You may need to reschedule your appointment if you arrive late (15 or more minutes).  Arriving late affects you and other patients whose appointments are after yours.  Also, if you miss three or more appointments without notifying the office, you may be dismissed from the clinic at the provider's discretion.      For prescription refill requests, have your pharmacy contact our office and allow 72 hours for refills to be completed.    Today you received the following chemotherapy and/or immunotherapy agents paclitaxel carboplatin      To help prevent nausea and vomiting after your treatment, we encourage you to take your nausea medication as directed.  BELOW ARE SYMPTOMS THAT SHOULD BE REPORTED IMMEDIATELY: *FEVER GREATER THAN 100.4 F (38 C) OR HIGHER *CHILLS OR SWEATING *NAUSEA AND VOMITING THAT IS NOT CONTROLLED WITH YOUR NAUSEA MEDICATION *UNUSUAL SHORTNESS OF BREATH *UNUSUAL BRUISING OR BLEEDING *URINARY PROBLEMS (pain or burning when urinating, or frequent urination) *BOWEL PROBLEMS (unusual diarrhea, constipation, pain near the anus) TENDERNESS IN MOUTH AND THROAT WITH OR WITHOUT PRESENCE OF ULCERS (sore throat, sores in mouth, or a toothache) UNUSUAL RASH, SWELLING OR PAIN  UNUSUAL VAGINAL DISCHARGE OR ITCHING   Items with * indicate a potential emergency and should be followed up as soon as possible or go to the Emergency Department if any problems should occur.  Please show the CHEMOTHERAPY ALERT CARD or IMMUNOTHERAPY ALERT CARD at  check-in to the Emergency Department and triage nurse.  Should you have questions after your visit or need to cancel or reschedule your appointment, please contact [facility]  Dept: [phone]  and follow the prompts.  Office hours are 8:00 a.m. to 4:30 p.m. Monday - Friday. Please note that voicemails left after 4:00 p.m. may not be returned until the following business day.  We are closed weekends and major holidays. You have access to a nurse at all times for urgent questions. Please call the main number to the clinic Dept: [phone] and follow the prompts.   For any non-urgent questions, you may also contact your provider using MyChart. We now offer e-Visits for anyone 32 and older to request care online for non-urgent symptoms. For details visit [email or web address].   Also download the MyChart app! Go to the app store, search "MyChart", open the app, select [facility], and log in with your MyChart username and password.  Masks are optional in the cancer centers. If you would like for your care team to wear a mask while they are taking care of you, please let them know. You may have one support person who is at least 82 years old accompany you for your appointments.

## 2022-06-22 ENCOUNTER — Other Ambulatory Visit: Payer: Self-pay

## 2022-06-23 ENCOUNTER — Other Ambulatory Visit: Payer: Self-pay

## 2022-06-28 DIAGNOSIS — R001 Bradycardia, unspecified: Secondary | ICD-10-CM | POA: Insufficient documentation

## 2022-06-30 MED FILL — Dexamethasone Sodium Phosphate Inj 100 MG/10ML: INTRAMUSCULAR | Qty: 1 | Status: AC

## 2022-06-30 MED FILL — Fosaprepitant Dimeglumine For IV Infusion 150 MG (Base Eq): INTRAVENOUS | Qty: 5 | Status: AC

## 2022-07-01 ENCOUNTER — Inpatient Hospital Stay: Payer: Medicare Other | Admitting: Hematology and Oncology

## 2022-07-01 ENCOUNTER — Inpatient Hospital Stay: Payer: Medicare Other

## 2022-07-01 ENCOUNTER — Other Ambulatory Visit: Payer: Self-pay

## 2022-07-01 ENCOUNTER — Other Ambulatory Visit: Payer: Medicare Other

## 2022-07-01 DIAGNOSIS — C5702 Malignant neoplasm of left fallopian tube: Secondary | ICD-10-CM | POA: Diagnosis not present

## 2022-07-01 DIAGNOSIS — E1142 Type 2 diabetes mellitus with diabetic polyneuropathy: Secondary | ICD-10-CM

## 2022-07-01 DIAGNOSIS — D61818 Other pancytopenia: Secondary | ICD-10-CM

## 2022-07-01 DIAGNOSIS — Z5111 Encounter for antineoplastic chemotherapy: Secondary | ICD-10-CM | POA: Diagnosis not present

## 2022-07-01 DIAGNOSIS — N183 Chronic kidney disease, stage 3 unspecified: Secondary | ICD-10-CM | POA: Diagnosis not present

## 2022-07-01 LAB — CMP (CANCER CENTER ONLY)
ALT: 11 U/L (ref 0–44)
AST: 16 U/L (ref 15–41)
Albumin: 3.8 g/dL (ref 3.5–5.0)
Alkaline Phosphatase: 83 U/L (ref 38–126)
Anion gap: 8 (ref 5–15)
BUN: 44 mg/dL — ABNORMAL HIGH (ref 8–23)
CO2: 26 mmol/L (ref 22–32)
Calcium: 9.5 mg/dL (ref 8.9–10.3)
Chloride: 102 mmol/L (ref 98–111)
Creatinine: 1.27 mg/dL — ABNORMAL HIGH (ref 0.44–1.00)
GFR, Estimated: 42 mL/min — ABNORMAL LOW (ref >60)
Glucose, Bld: 227 mg/dL — ABNORMAL HIGH (ref 70–99)
Potassium: 5.1 mmol/L (ref 3.5–5.1)
Sodium: 136 mmol/L (ref 135–145)
Total Bilirubin: 0.4 mg/dL (ref 0.3–1.2)
Total Protein: 6.9 g/dL (ref 6.5–8.1)

## 2022-07-01 LAB — CBC WITH DIFFERENTIAL (CANCER CENTER ONLY)
Abs Immature Granulocytes: 0.18 10*3/uL — ABNORMAL HIGH (ref 0.00–0.07)
Basophils Absolute: 0 10*3/uL (ref 0.0–0.1)
Basophils Relative: 0 %
Eosinophils Absolute: 0 10*3/uL (ref 0.0–0.5)
Eosinophils Relative: 0 %
HCT: 27.9 % — ABNORMAL LOW (ref 36.0–46.0)
Hemoglobin: 9.6 g/dL — ABNORMAL LOW (ref 12.0–15.0)
Immature Granulocytes: 3 %
Lymphocytes Relative: 18 %
Lymphs Abs: 1.1 10*3/uL (ref 0.7–4.0)
MCH: 32.3 pg (ref 26.0–34.0)
MCHC: 34.4 g/dL (ref 30.0–36.0)
MCV: 93.9 fL (ref 80.0–100.0)
Monocytes Absolute: 0.1 10*3/uL (ref 0.1–1.0)
Monocytes Relative: 2 %
Neutro Abs: 4.9 10*3/uL (ref 1.7–7.7)
Neutrophils Relative %: 77 %
Platelet Count: 138 10*3/uL — ABNORMAL LOW (ref 150–400)
RBC: 2.97 MIL/uL — ABNORMAL LOW (ref 3.87–5.11)
RDW: 15.8 % — ABNORMAL HIGH (ref 11.5–15.5)
WBC Count: 6.3 10*3/uL (ref 4.0–10.5)
nRBC: 0 % (ref 0.0–0.2)

## 2022-07-01 MED ORDER — SODIUM CHLORIDE 0.9 % IV SOLN
478.2000 mg | Freq: Once | INTRAVENOUS | Status: AC
  Administered 2022-07-01: 480 mg via INTRAVENOUS
  Filled 2022-07-01: qty 48

## 2022-07-01 MED ORDER — SODIUM CHLORIDE 0.9% FLUSH
10.0000 mL | INTRAVENOUS | Status: DC | PRN
  Administered 2022-07-01: 10 mL

## 2022-07-01 MED ORDER — PALONOSETRON HCL INJECTION 0.25 MG/5ML
0.2500 mg | Freq: Once | INTRAVENOUS | Status: AC
  Administered 2022-07-01: 0.25 mg via INTRAVENOUS
  Filled 2022-07-01: qty 5

## 2022-07-01 MED ORDER — SODIUM CHLORIDE 0.9 % IV SOLN
10.0000 mg | Freq: Once | INTRAVENOUS | Status: AC
  Administered 2022-07-01: 10 mg via INTRAVENOUS
  Filled 2022-07-01: qty 10

## 2022-07-01 MED ORDER — FAMOTIDINE IN NACL 20-0.9 MG/50ML-% IV SOLN
20.0000 mg | Freq: Once | INTRAVENOUS | Status: AC
  Administered 2022-07-01: 20 mg via INTRAVENOUS
  Filled 2022-07-01: qty 50

## 2022-07-01 MED ORDER — SODIUM CHLORIDE 0.9 % IV SOLN: Freq: Once | INTRAVENOUS | Status: AC

## 2022-07-01 MED ORDER — SODIUM CHLORIDE 0.9 % IV SOLN
150.0000 mg | Freq: Once | INTRAVENOUS | Status: AC
  Administered 2022-07-01: 150 mg via INTRAVENOUS
  Filled 2022-07-01: qty 150

## 2022-07-01 MED ORDER — SODIUM CHLORIDE 0.9% FLUSH
10.0000 mL | Freq: Once | INTRAVENOUS | Status: AC
  Administered 2022-07-01: 10 mL

## 2022-07-01 MED ORDER — CETIRIZINE HCL 10 MG/ML IV SOLN
10.0000 mg | Freq: Once | INTRAVENOUS | Status: AC
  Administered 2022-07-01: 10 mg via INTRAVENOUS
  Filled 2022-07-01: qty 1

## 2022-07-01 MED ORDER — HEPARIN SOD (PORK) LOCK FLUSH 100 UNIT/ML IV SOLN
500.0000 [IU] | Freq: Once | INTRAVENOUS | Status: AC | PRN
  Administered 2022-07-01: 500 [IU]

## 2022-07-01 MED ORDER — SODIUM CHLORIDE 0.9 % IV SOLN
105.0000 mg/m2 | Freq: Once | INTRAVENOUS | Status: AC
  Administered 2022-07-01: 222 mg via INTRAVENOUS
  Filled 2022-07-01: qty 37

## 2022-07-01 NOTE — Patient Instructions (Signed)
[facility]  Discharge Instructions: Thank you for choosing [facility] to provide your oncology and hematology care.   If you have a lab appointment with the Cancer Center, please go directly to the Cancer Center and check in at the registration area.   Wear comfortable clothing and clothing appropriate for easy access to any Portacath or PICC line.   We strive to give you quality time with your provider. You may need to reschedule your appointment if you arrive late (15 or more minutes).  Arriving late affects you and other patients whose appointments are after yours.  Also, if you miss three or more appointments without notifying the office, you may be dismissed from the clinic at the provider's discretion.      For prescription refill requests, have your pharmacy contact our office and allow 72 hours for refills to be completed.    Today you received the following chemotherapy and/or immunotherapy agents: paclitaxel and carboplatin      To help prevent nausea and vomiting after your treatment, we encourage you to take your nausea medication as directed.  BELOW ARE SYMPTOMS THAT SHOULD BE REPORTED IMMEDIATELY: *FEVER GREATER THAN 100.4 F (38 C) OR HIGHER *CHILLS OR SWEATING *NAUSEA AND VOMITING THAT IS NOT CONTROLLED WITH YOUR NAUSEA MEDICATION *UNUSUAL SHORTNESS OF BREATH *UNUSUAL BRUISING OR BLEEDING *URINARY PROBLEMS (pain or burning when urinating, or frequent urination) *BOWEL PROBLEMS (unusual diarrhea, constipation, pain near the anus) TENDERNESS IN MOUTH AND THROAT WITH OR WITHOUT PRESENCE OF ULCERS (sore throat, sores in mouth, or a toothache) UNUSUAL RASH, SWELLING OR PAIN  UNUSUAL VAGINAL DISCHARGE OR ITCHING   Items with * indicate a potential emergency and should be followed up as soon as possible or go to the Emergency Department if any problems should occur.  Please show the CHEMOTHERAPY ALERT CARD or IMMUNOTHERAPY ALERT  CARD at check-in to the Emergency Department and triage nurse.  Should you have questions after your visit or need to cancel or reschedule your appointment, please contact [facility]  Dept: [phone]  and follow the prompts.  Office hours are 8:00 a.m. to 4:30 p.m. Monday - Friday. Please note that voicemails left after 4:00 p.m. may not be returned until the following business day.  We are closed weekends and major holidays. You have access to a nurse at all times for urgent questions. Please call the main number to the clinic Dept: [phone] and follow the prompts.   For any non-urgent questions, you may also contact your provider using MyChart. We now offer e-Visits for anyone 18 and older to request care online for non-urgent symptoms. For details visit [email or web address].   Also download the MyChart app! Go to the app store, search "MyChart", open the app, select [facility], and log in with your MyChart username and password.  Masks are optional in the cancer centers. If you would like for your care team to wear a mask while they are taking care of you, please let them know. You may have one support person who is at least 82 years old accompany you for your appointments. 

## 2022-07-02 DIAGNOSIS — D61818 Other pancytopenia: Secondary | ICD-10-CM | POA: Insufficient documentation

## 2022-07-02 NOTE — Assessment & Plan Note (Signed)
This is due to treatment She is not symptomatic Observe closely

## 2022-07-02 NOTE — Assessment & Plan Note (Signed)
She tolerated treatment well except for pancytopenia, without worsening neuropathy We will proceed with treatment as scheduled

## 2022-07-02 NOTE — Assessment & Plan Note (Signed)
This is stable We will continue similar dose as before 

## 2022-07-02 NOTE — Assessment & Plan Note (Signed)
We discussed risk factors for worsening kidney failure We discussed importance of adequate hydration and will adjust dose of chemo accordingly 

## 2022-07-02 NOTE — Progress Notes (Signed)
[facility] OFFICE PROGRESS NOTE  Patient Care Team: Margretta Sidle, MD as PCP - Cristino Martes, MD (Inactive) as Consulting Physician (Dermatology) Lavonna Monarch, MD (Inactive) as Consulting Physician (Dermatology)  ASSESSMENT & PLAN:  Fallopian tube cancer, carcinoma, left ([place]) She tolerated treatment well except for pancytopenia, without worsening neuropathy We will proceed with treatment as scheduled  Pancytopenia, acquired ([place]) This is due to treatment She is not symptomatic Observe closely  CKD (chronic kidney disease), stage III ([place]) We discussed risk factors for worsening kidney failure We discussed importance of adequate hydration and will adjust dose of chemo accordingly  Type 2 diabetes, controlled, 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   All questions were answered. The patient knows to call the clinic with any problems, questions or concerns. The total time spent in the appointment was 25 minutes encounter with patients including review of chart and various tests results, discussions about plan of care and coordination of care plan   Heath Lark, MD 07/02/2022 8:06 PM  INTERVAL HISTORY: Please see below for problem oriented charting. she returns for treatment follow-up seen prior to cycle number 2 No worsening neuropathy Denies side-effects so far  REVIEW OF SYSTEMS:   Constitutional: Denies fevers, chills or abnormal weight loss Eyes: Denies blurriness of vision Ears, nose, mouth, throat, and face: Denies mucositis or sore throat Respiratory: Denies cough, dyspnea or wheezes Cardiovascular: Denies palpitation, chest discomfort or lower extremity swelling Gastrointestinal:  Denies nausea, heartburn or change in bowel habits Skin: Denies abnormal skin rashes Lymphatics: Denies new lymphadenopathy or easy  bruising Neurological:Denies numbness, tingling or new weaknesses Behavioral/Psych: Mood is stable, no new changes  All other systems were reviewed with the patient and are negative.  I have reviewed the past medical history, past surgical history, social history and family history with the patient and they are unchanged from previous note.  ALLERGIES:  is allergic to augmentin [amoxicillin-pot clavulanate] and ivp dye [iodinated contrast media].  MEDICATIONS:  Current Outpatient Medications  Medication Sig Dispense Refill   acetaminophen (TYLENOL) 500 MG tablet Take 500-1,000 mg by mouth every 6 (six) hours as needed (pain.).     allopurinol (ZYLOPRIM) 300 MG tablet Take 300 mg by mouth at bedtime.     amLODipine (NORVASC) 5 MG tablet Take 5 mg by mouth at bedtime.     atenolol (TENORMIN) 50 MG tablet Take 100 mg by mouth at bedtime.     atorvastatin (LIPITOR) 80 MG tablet Take 80 mg by mouth every other day. At night     cholecalciferol (VITAMIN D3) 25 MCG (1000 UNIT) tablet Take 2,000 Units by mouth at bedtime.     citalopram (CELEXA) 10 MG tablet Take 10 mg by mouth in the morning.     dexamethasone (DECADRON) 4 MG tablet Take 2 tabs at the night before and 2 tab the morning of chemotherapy, every 3 weeks, by mouth x 6 cycles (Patient not taking: Reported on 05/31/2022) 36 tablet 6   divalproex (DEPAKOTE) 250 MG DR tablet Take 750 mg by mouth in the morning.     furosemide (LASIX) 20 MG tablet Take 40 mg by mouth in the morning.     glipiZIDE (GLUCOTROL) 5 MG tablet Take 5 mg by mouth in the morning.     Lancets (ONETOUCH DELICA PLUS [id number]) MISC Apply topically daily.     levothyroxine (SYNTHROID) 50  MCG tablet Take 50 mcg by mouth daily before breakfast.     lidocaine-prilocaine (EMLA) cream Apply to affected area once (Patient not taking: Reported on 05/31/2022) 30 g 3   miconazole (BAZA ANTIFUNGAL) 2 % cream Apply 1 Application topically 2 (two) times daily as needed (skin  irritation.).     ondansetron (ZOFRAN) 8 MG tablet Take 1 tablet (8 mg total) by mouth every 8 (eight) hours as needed for nausea or vomiting. Start on the third day after chemotherapy. (Patient not taking: Reported on 05/31/2022) 30 tablet 1   prochlorperazine (COMPAZINE) 10 MG tablet Take 1 tablet (10 mg total) by mouth every 6 (six) hours as needed for nausea or vomiting. (Patient not taking: Reported on 05/31/2022) 30 tablet 1   No current facility-administered medications for this visit.    SUMMARY OF ONCOLOGIC HISTORY: Oncology History Overview Note  HER2 negative, High grade serous   Fallopian tube cancer, carcinoma, left ([place])  03/29/2022 Imaging   MRI of the abdomen and pelvis reveals a left adnexal/ovarian mass measuring 5.5 x 2.8 x 3.7 cm with solid diffuse enhancement and T2 signal characteristics and intermediate to accentuated T1 signal characteristics.  No significant cystic component or ascites.  Renal lesion of the right kidney lower pole is thought to represent a benign lesion.  1.3 cm cystic lesion of the head of the pancreas is recommended to be follow-up with pancreatic MRI in 2 years.   05/02/2022 Tumor Marker   Patient's tumor was tested for the following markers: CA-125. Results of the tumor marker test revealed 69.   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 Fullness appreciated within the left cul de sac. On intra-abdominal entry, normal upper abdominal survey including liver edge, diaphragm, and stomach. Normal appearing omentum. No ascites. Normal appearing small bowel. Significant inflammation within the pelvis concerning initially for recent diverticulitis versus endometriosis. Thickened and inflamed pelvic peritoneum. Filmy adhesions between the  anterior cul de sac and uterus, between the right adnexa and and the sigmoid colon. Right adnexa inflammed but otherwise normal in appearing. Left fallopian tube dilated with evidence of hydrosalpinx. Friable adhesions of the fallopian tube to the sigmoid mesentery, left ovary and left broad ligament. No ascites. Uterus bulbous and 10cm with findings of endosalpingiosis versus endometriosis on much of the uterine serosa. Dense adhesions between the bladder and cervix. Cul de sac without evidence of disease. No ascites.  Cystoscopy findings: bladder dome intact, normal efflux from bilateral ureteral orifices. On frozen section, no abnormality of the endometrium. Anterior cul de sac adhesion with findings of hemosiderin, no definitive malignancy. Fallopian tube with malignancy.  Given findings and pelvic peritoneal involvement given fallopian tube adherent to surrounding structures, no upper abdominal findings, normal omentum, and normal neuroassessment intraoperatively as well as on preoperative imaging, lymphadenectomy deferred.   05/10/2022 Pathology Results   Procedure: Hysterectomy with bilateral salpingo-oophorectomy  Specimen Integrity: Intact  Tumor Site: Left fallopian tube  Tumor Size: 6.3 x 3.8 x 2.8 cm  Histologic Type: Serous carcinoma  Histologic Grade: High-grade  Ovarian Surface Involvement: Not identified  Fallopian Tube Surface Involvement: Not identified  Implants (required for advanced stage serous/seromucinous borderline  tumors only): Not applicable  Other Tissue/ Organ Involvement: Not applicable  Largest Extrapelvic Peritoneal Focus: Not applicable  Peritoneal/Ascitic Fluid Involvement: Not identified [facility] [phone])  Chemotherapy  Response Score (CRS): Not applicable, no known presurgical  therapy  Regional Lymph Nodes: Not applicable (no lymph nodes submitted or found)  Distant Metastasis: Not applicable  Pathologic Stage Classification (pTNM, AJCC 8th Edition): pT1a, pN n/a   Ancillary Studies: Available upon request  Representative Tumor Block: B15  Comment(s): An immunohistochemistry stain for p53 is performed with  adequate control and is diffusely positive within the tumor.  The same  stain shows patchy variable staining within the reactive mesothelium  lining the serosal adhesions within the anterior cul-de-sac    05/17/2022 Initial Diagnosis   Fallopian tube cancer, carcinoma, left ([place])   05/30/2022 Cancer Staging   Staging form: Ovary, Fallopian Tube, and Primary Peritoneal Carcinoma, AJCC 8th Edition - Pathologic stage from 05/30/2022: FIGO Stage IIB (pT2b, pN0, cM0) - Signed by Heath Lark, MD on 05/30/2022 Stage prefix: Initial diagnosis   06/10/2022 -  Chemotherapy   Patient is on Treatment Plan : OVARIAN Carboplatin (AUC 6) + Paclitaxel (175) q21d X 6 Cycles     06/10/2022 Procedure   Placement of a subcutaneous power-injectable port device. Catheter tip at the superior cavoatrial junction.       PHYSICAL EXAMINATION: ECOG PERFORMANCE STATUS: 1 - Symptomatic but completely ambulatory  Vitals:   07/01/22 0835  BP: (!) 152/50  Pulse: 70  Resp: 18  Temp: (!) 97.5 F (36.4 C)  SpO2: 98%   Filed Weights   07/01/22 0835  Weight: 226 lb 12.8 oz (102.9 kg)    GENERAL:alert, no distress and comfortable NEURO: alert & oriented x 3 with fluent speech, no focal motor/sensory deficits  LABORATORY DATA:  I have reviewed the data as listed    Component Value Date/Time   NA 136 07/01/2022 0808   K 5.1 07/01/2022 0808   CL 102 07/01/2022 0808   CO2 26 07/01/2022 0808   GLUCOSE 227 (H) 07/01/2022 0808   BUN 44 (H) 07/01/2022 0808   CREATININE 1.27 (H) 07/01/2022 0808   CALCIUM 9.5 07/01/2022 0808   PROT 6.9 07/01/2022 0808   ALBUMIN 3.8 07/01/2022 0808   AST 16 07/01/2022 0808   ALT 11 07/01/2022 0808   ALKPHOS 83 07/01/2022 0808   BILITOT 0.4 07/01/2022 0808   GFRNONAA 42 (L) 07/01/2022 0808   GFRAA 57 (L) 12/25/2012 0850    No  results found for: "SPEP", "UPEP"  Lab Results  Component Value Date   WBC 6.3 07/01/2022   NEUTROABS 4.9 07/01/2022   HGB 9.6 (L) 07/01/2022   HCT 27.9 (L) 07/01/2022   MCV 93.9 07/01/2022   PLT 138 (L) 07/01/2022      Chemistry      Component Value Date/Time   NA 136 07/01/2022 0808   K 5.1 07/01/2022 0808   CL 102 07/01/2022 0808   CO2 26 07/01/2022 0808   BUN 44 (H) 07/01/2022 0808   CREATININE 1.27 (H) 07/01/2022 0808      Component Value Date/Time   CALCIUM 9.5 07/01/2022 0808   ALKPHOS 83 07/01/2022 0808   AST 16 07/01/2022 0808   ALT 11 07/01/2022 0808   BILITOT 0.4 07/01/2022 4081

## 2022-07-03 ENCOUNTER — Encounter: Payer: Self-pay | Admitting: Hematology and Oncology

## 2022-07-04 ENCOUNTER — Encounter: Payer: Self-pay | Admitting: Primary Care

## 2022-07-04 ENCOUNTER — Ambulatory Visit: Payer: Medicare Other | Admitting: Primary Care

## 2022-07-04 VITALS — BP 128/64 | HR 60 | Temp 98.4°F | Ht 63.0 in | Wt 225.4 lb

## 2022-07-04 DIAGNOSIS — G4733 Obstructive sleep apnea (adult) (pediatric): Secondary | ICD-10-CM | POA: Diagnosis not present

## 2022-07-04 NOTE — Progress Notes (Signed)
$'@Patient'D$  ID: Sarah Bishop, female    DOB: May 09, 1941, 81 y.o.   MRN: [id number]  Chief Complaint  Patient presents with   Follow-up    Referring provider: Margretta Sidle, MD  HPI: 81 year old female, former smoker. Past medical history significant for obesity, rheumatoid arthritis, gout, HTN, seizures, diabetes, hypothyroidism, hyperlipidemia, kidney disease, hear murmur, peripheral edema, incontinence.   Previous [facility] pulmonary encounter: 03/09/2022 Patient presents today for sleep consult.  Patient has symptoms of loud snoring, witnessed apnea, waking up gasping for air and daytime sleepiness.  He lives with her daughter who has sleep apnea herself and wears a CPAP. Typical bedtime is between 11 PM and 1 AM.  She sleeps in a recliner chair.  It does not take her long to fall asleep.  She starts her day at 6 AM.  She spends most of the day in her chair and is not very active.  She does not have much of a routine throughout the day.  She will fall asleep watching TV.  She has had no previous sleep study.  She is not on CPAP or oxygen.  Epworth score is 10.  She has a history of epilepsy, her last seizure was in 1993.  No symptoms of narcolepsy, cataplexy or sleepwalking.  Sleep questionnaire Symptoms- loud snoring, witness apnea, waking up gasping for air, daytime sleepiness Prior sleep study- None  Bedtime- Varies: between 11pm-1am Time to fall asleep-  not long Nocturnal awakenings- at least twice  Out of bed/start of day- 5-6am  Weight changes- stable, she has lost weight  Do you operate heavy machinery- No Do you currently wear CPAP- No Do you current wear oxygen- No Epworth- 10  07/04/2022 - Interim hx  Patient presents today for new CPAP follow-up. She is doing well. Since last visit she was dx with fallopian tube, she had a hysterectomy and is currently on chemotherapy. She is newly on CPAP since August. She uses full face mask, having some airleaks. She is compliant with use. She  still has some daytime sleepiness. Her daughter thinks she may have a little more energy. Epworth 6 (10).   Airview download 06/04/22-07/03/22 30/30 days; 100% > 4 hours Average usage 7 hours 6 mins  Pressure 13cm h20  Airleaks 16.1L/min (95%) AHI 11.1   Allergies  Allergen Reactions   Augmentin [Amoxicillin-Pot Clavulanate] Other (See Comments)    Unsure of reaction.   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   Right Mid Paraspinal-Moderate   Diabetes ([place] 03/09/2022   Diabetes mellitus without complication (HCC)    Dyspnea    with exertion   Epilepsy ([place] 03/09/2022   Gout    Heart murmur 03/09/2022   Hx of seizure disorder 03/09/2022   Hyperlipidemia    Hypertension    Hypertension 03/09/2022   Hypothyroidism    Peripheral vascular disease ([facility]    Seizures ([facility]    diagnosed age [age]yr no seizures for "yrs"   Sleep apnea    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     allopurinol (ZYLOPRIM) 300 MG tablet Take 300 mg by mouth at bedtime.     amLODipine (NORVASC) 5 MG tablet Take 5 mg by mouth at bedtime.     atenolol (TENORMIN) 50 MG tablet Take 100 mg  by mouth at bedtime.     atorvastatin (LIPITOR) 80 MG tablet Take 80 mg by mouth every other day. At night     cholecalciferol (VITAMIN D3) 25 MCG (1000 UNIT) tablet Take 2,000 Units by mouth at bedtime.     citalopram (CELEXA) 10 MG tablet Take 10 mg by mouth in the morning.     divalproex (DEPAKOTE) 250 MG DR tablet Take 750 mg by mouth in the morning.     furosemide (LASIX) 20 MG tablet Take 40 mg by mouth in the morning.     glipiZIDE (GLUCOTROL) 5 MG tablet Take 5 mg by mouth in the morning.     Lancets (ONETOUCH DELICA PLUS [id number]) MISC Apply topically daily.     levothyroxine (SYNTHROID) 50 MCG tablet Take 50 mcg by mouth daily before breakfast.     lidocaine-prilocaine (EMLA) cream Apply to affected area once 30 g 3   miconazole (BAZA ANTIFUNGAL) 2 % cream Apply 1 Application topically 2 (two) times daily as needed (skin irritation.).     dexamethasone (DECADRON) 4 MG tablet Take 2 tabs at the night before and 2 tab the morning of chemotherapy, every 3 weeks, by mouth x 6 cycles (Patient not taking: Reported on 05/31/2022) 36 tablet 6   ondansetron (ZOFRAN) 8 MG tablet Take 1 tablet (8 mg total) by mouth every 8 (eight) hours as needed for nausea or vomiting. Start on the third day after chemotherapy. (Patient not taking: Reported on 05/31/2022) 30 tablet 1   prochlorperazine (COMPAZINE) 10 MG tablet Take 1 tablet (10 mg total) by mouth every 6 (six) hours as needed for nausea or vomiting. (Patient not taking: Reported on 05/31/2022) 30 tablet 1   No facility-administered medications prior to visit.   Review of Systems  Review of Systems  Constitutional:  Positive for fatigue.  HENT: Negative.    Respiratory: Negative.  Negative for apnea.   Cardiovascular: Negative.    Physical Exam  BP 128/64 (BP Location: Left Arm, Patient Position: Sitting, Cuff Size: Large)   Pulse 60   Temp 98.4 F (36.9 C) (Oral)   Ht '5\' 3"'$  (1.6 m)   Wt 225 lb 6.4 oz (102.2 kg)   SpO2 98%   BMI 39.93  kg/m  Physical Exam Constitutional:      Appearance: Normal appearance. She is not ill-appearing.  HENT:     Head: Normocephalic and atraumatic.     Mouth/Throat:     Mouth: Mucous membranes are moist.     Pharynx: Oropharynx is clear.  Cardiovascular:     Rate and Rhythm: Normal rate and regular rhythm.  Pulmonary:     Effort: Pulmonary effort is normal.     Breath sounds: Normal breath sounds. No wheezing, rhonchi or rales.     Comments: CTA Musculoskeletal:        General: Normal range of motion.  Skin:    General: Skin is  warm and dry.     Comments: Hair loss secondary to chemotherapy   Neurological:     General: No focal deficit present.     Mental Status: She is alert. Mental status is at baseline.  Psychiatric:        Mood and Affect: Mood normal.        Behavior: Behavior normal.        Thought Content: Thought content normal.        Judgment: Judgment normal.      Lab Results:  CBC    Component Value Date/Time   WBC 6.3 07/01/2022 0808   WBC 9.5 05/02/2022 0853   RBC 2.97 (L) 07/01/2022 0808   HGB 9.6 (L) 07/01/2022 0808   HCT 27.9 (L) 07/01/2022 0808   PLT 138 (L) 07/01/2022 0808   MCV 93.9 07/01/2022 0808   MCH 32.3 07/01/2022 0808   MCHC 34.4 07/01/2022 0808   RDW 15.8 (H) 07/01/2022 0808   LYMPHSABS 1.1 07/01/2022 0808   MONOABS 0.1 07/01/2022 0808   EOSABS 0.0 07/01/2022 0808   BASOSABS 0.0 07/01/2022 0808    BMET    Component Value Date/Time   NA 136 07/01/2022 0808   K 5.1 07/01/2022 0808   CL 102 07/01/2022 0808   CO2 26 07/01/2022 0808   GLUCOSE 227 (H) 07/01/2022 0808   BUN 44 (H) 07/01/2022 0808   CREATININE 1.27 (H) 07/01/2022 0808   CALCIUM 9.5 07/01/2022 0808   GFRNONAA 42 (L) 07/01/2022 0808   GFRAA 57 (L) 12/25/2012 0850    BNP No results found for: "BNP"  ProBNP No results found for: "PROBNP"  Imaging: IR IMAGING GUIDED PORT INSERTION  Result Date: 06/09/2022 INDICATION: 81 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 Port-A-Cath needed for treatment. EXAM: FLUOROSCOPIC AND ULTRASOUND GUIDED PLACEMENT OF A SUBCUTANEOUS PORT COMPARISON:  None Available. MEDICATIONS: Moderate sedation ANESTHESIA/SEDATION: Moderate (conscious) sedation was employed during this procedure. A total of Versed 0.5 mg and fentanyl 50 mcg was administered intravenously at the order of the provider performing the procedure. Total intra-service moderate sedation time: 28 minutes. Patient's level of consciousness and vital signs were monitored continuously by radiology nurse throughout the procedure under the supervision of the provider performing the procedure. FLUOROSCOPY TIME:  Radiation Exposure Index (as provided by the fluoroscopic device): 4 mGy Kerma COMPLICATIONS: None immediate. PROCEDURE: The procedure, risks, benefits, and alternatives were explained to the patient. Questions regarding the procedure were encouraged and answered. The patient understands and consents to the procedure. Patient was placed supine on the interventional table. Ultrasound confirmed a patent right internal jugular vein. Ultrasound image was saved for documentation. The right chest and neck were cleaned with a skin antiseptic and a sterile drape was placed. Maximal barrier sterile technique was utilized including caps, mask, sterile gowns, sterile gloves, sterile drape, hand hygiene and skin antiseptic. The right neck was anesthetized with 1% lidocaine. Small incision was made in the right neck with a blade. Micropuncture set was placed in the right internal jugular vein with ultrasound guidance. The micropuncture wire was used for measurement purposes. The right chest was anesthetized with 1% lidocaine with epinephrine. #15 blade was used to make an incision and a subcutaneous port pocket was formed. [place] was assembled. Subcutaneous tunnel was formed with a stiff tunneling device. The port catheter was brought through the subcutaneous tunnel. The  port was placed in the subcutaneous pocket. The micropuncture set was exchanged for a peel-away sheath. The catheter was placed through  the peel-away sheath and the tip was positioned at the superior cavoatrial junction. Catheter placement was confirmed with fluoroscopy. The port was accessed and flushed with heparinized saline. The port pocket was closed using two layers of absorbable sutures and Dermabond. The vein skin site was closed using a single layer of absorbable suture and Dermabond. Sterile dressings were applied. Patient tolerated the procedure well without an immediate complication. Ultrasound and fluoroscopic images were taken and saved for this procedure. IMPRESSION: Placement of a subcutaneous power-injectable port device. Catheter tip at the superior cavoatrial junction. Electronically Signed   By: Markus Daft M.D.   On: 06/09/2022 17:55     Assessment & Plan:   OSA on CPAP - HST 03/20/22 showed severe sleep apnea, AHI 60/hour. She underwent CPAP titration and optimal pressure was 13cm h20. She is 100% compliant with CPAP use.  She having some daytime sleepiness. Epworth score has improved. Average usage 7 hours.  Current CPAP pressure 13 cm H2O; residual AHI 11.1/hour.  Recommend changing CPAP pressure settings to auto titrate 8 to 18 cm H2O. FU virtually in 6-8 weeks for download.      Martyn Ehrich, NP 07/04/2022

## 2022-07-04 NOTE — Assessment & Plan Note (Addendum)
-   HST 03/20/22 showed severe sleep apnea, AHI 60/hour. She underwent CPAP titration and optimal pressure was 13cm h20. She is 100% compliant with CPAP use.  She having some daytime sleepiness. Epworth score has improved. Average usage 7 hours.  Current CPAP pressure 13 cm H2O; residual AHI 11.1/hour.  Recommend changing CPAP pressure settings to auto titrate 8 to 18 cm H2O. FU virtually in 6-8 weeks for download.

## 2022-07-04 NOTE — Patient Instructions (Addendum)
Excellent compliance with CPAP You are still having some residual apneas, we will change from fixed pressure to auto pressure settings   Recommendations: Continue to wear CPAP every night from 4-6 hours or longer  Orders: Change CPAP to auto 8-18cm h20 / DME [facility]   Follow-up: Virtual visit in 6-8 weeks with Eustaquio Maize NP

## 2022-07-13 ENCOUNTER — Other Ambulatory Visit: Payer: Self-pay | Admitting: Licensed Clinical Social Worker

## 2022-07-13 DIAGNOSIS — C5702 Malignant neoplasm of left fallopian tube: Secondary | ICD-10-CM

## 2022-07-16 ENCOUNTER — Other Ambulatory Visit: Payer: Self-pay

## 2022-07-18 ENCOUNTER — Inpatient Hospital Stay: Payer: Medicare Other | Attending: Gynecologic Oncology | Admitting: Licensed Clinical Social Worker

## 2022-07-18 ENCOUNTER — Other Ambulatory Visit: Payer: Medicare Other

## 2022-07-18 ENCOUNTER — Encounter: Payer: Self-pay | Admitting: Licensed Clinical Social Worker

## 2022-07-18 DIAGNOSIS — N183 Chronic kidney disease, stage 3 unspecified: Secondary | ICD-10-CM | POA: Insufficient documentation

## 2022-07-18 DIAGNOSIS — C5702 Malignant neoplasm of left fallopian tube: Secondary | ICD-10-CM | POA: Insufficient documentation

## 2022-07-18 DIAGNOSIS — D61818 Other pancytopenia: Secondary | ICD-10-CM | POA: Insufficient documentation

## 2022-07-18 DIAGNOSIS — Z800 Family history of malignant neoplasm of digestive organs: Secondary | ICD-10-CM

## 2022-07-18 DIAGNOSIS — Z803 Family history of malignant neoplasm of breast: Secondary | ICD-10-CM | POA: Diagnosis not present

## 2022-07-18 DIAGNOSIS — Z79899 Other long term (current) drug therapy: Secondary | ICD-10-CM | POA: Insufficient documentation

## 2022-07-18 NOTE — Progress Notes (Signed)
REFERRING PROVIDER: Lafonda Mosses, MD [place],  [place] [id number]  PRIMARY PROVIDER:  Margretta Sidle, MD  PRIMARY REASON FOR VISIT:  1. Fallopian tube cancer, carcinoma, left ([place])   2. Family history of colon cancer   3. Family history of breast cancer      HISTORY OF PRESENT ILLNESS:   Sarah Bishop, a 81 y.o. female, was seen for a [facility] cancer genetics consultation at the request of Dr. Berline Lopes due to a personal and family history of cancer.  [name] presents to clinic today to discuss the possibility of a hereditary predisposition to cancer, genetic testing, and to further clarify her future cancer risks, as well as potential cancer risks for family members.   In 2023, at the age of 72, [name] was diagnosed with fallopian tube carcinoma. She had TAH-BSO and is currently being treated with chemotherapy.  CANCER HISTORY:  Oncology History Overview Note  HER2 negative, High grade serous   Fallopian tube cancer, carcinoma, left ([place])  03/29/2022 Imaging   MRI of the abdomen and pelvis reveals a left adnexal/ovarian mass measuring 5.5 x 2.8 x 3.7 cm with solid diffuse enhancement and T2 signal characteristics and intermediate to accentuated T1 signal characteristics.  No significant cystic component or ascites.  Renal lesion of the right kidney lower pole is thought to represent a benign lesion.  1.3 cm cystic lesion of the head of the pancreas is recommended to be follow-up with pancreatic MRI in 2 years.   05/02/2022 Tumor Marker   Patient's tumor was tested for the following markers: CA-125. Results of the tumor marker test revealed 69.   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 Fullness appreciated within the left cul de sac. On  intra-abdominal entry, normal upper abdominal survey including liver edge, diaphragm, and stomach. Normal appearing omentum. No ascites. Normal appearing small bowel. Significant inflammation within the pelvis concerning initially for recent diverticulitis versus endometriosis. Thickened and inflamed pelvic peritoneum. Filmy adhesions between the anterior cul de sac and uterus, between the right adnexa and and the sigmoid colon. Right adnexa inflammed but otherwise normal in appearing. Left fallopian tube dilated with evidence of hydrosalpinx. Friable adhesions of the fallopian tube to the sigmoid mesentery, left ovary and left broad ligament. No ascites. Uterus bulbous and 10cm with findings of endosalpingiosis versus endometriosis on much of the uterine serosa. Dense adhesions between the bladder and cervix. Cul de sac without evidence of disease. No ascites.  Cystoscopy findings: bladder dome intact, normal efflux from bilateral ureteral orifices. On frozen section, no abnormality of the endometrium. Anterior cul de sac adhesion with findings of hemosiderin, no definitive malignancy. Fallopian tube with malignancy.  Given findings and pelvic peritoneal involvement given fallopian tube adherent to surrounding structures, no upper abdominal findings, normal omentum, and normal neuroassessment intraoperatively as well as on preoperative imaging, lymphadenectomy deferred.   05/10/2022 Pathology Results   Procedure: Hysterectomy with bilateral salpingo-oophorectomy  Specimen Integrity: Intact  Tumor Site: Left fallopian tube  Tumor Size: 6.3 x 3.8 x 2.8 cm  Histologic Type: Serous carcinoma  Histologic Grade: High-grade  Ovarian Surface Involvement: Not identified  Fallopian Tube Surface Involvement: Not identified  Implants (required for advanced stage serous/seromucinous borderline  tumors only): Not applicable  Other Tissue/ Organ Involvement: Not applicable  Largest Extrapelvic Peritoneal  Focus: Not  applicable  Peritoneal/Ascitic Fluid Involvement: Not identified [facility] [phone])  Chemotherapy Response Score (CRS): Not applicable, no known presurgical  therapy  Regional Lymph Nodes: Not applicable (no lymph nodes submitted or found)  Distant Metastasis: Not applicable  Pathologic Stage Classification (pTNM, AJCC 8th Edition): pT1a, pN n/a  Ancillary Studies: Available upon request  Representative Tumor Block: B15  Comment(s): An immunohistochemistry stain for p53 is performed with  adequate control and is diffusely positive within the tumor.  The same  stain shows patchy variable staining within the reactive mesothelium  lining the serosal adhesions within the anterior cul-de-sac    05/17/2022 Initial Diagnosis   Fallopian tube cancer, carcinoma, left ([place])   05/30/2022 Cancer Staging   Staging form: Ovary, Fallopian Tube, and Primary Peritoneal Carcinoma, AJCC 8th Edition - Pathologic stage from 05/30/2022: FIGO Stage IIB (pT2b, pN0, cM0) - Signed by Heath Lark, MD on 05/30/2022 Stage prefix: Initial diagnosis   06/10/2022 -  Chemotherapy   Patient is on Treatment Plan : OVARIAN Carboplatin (AUC 6) + Paclitaxel (175) q21d X 6 Cycles     06/10/2022 Procedure   Placement of a subcutaneous power-injectable port device. Catheter tip at the superior cavoatrial junction.      RISK FACTORS:  Menarche was at age 63.  First live birth at age 63.  OCP use for approximately 0 years.  Ovaries intact: no.  Hysterectomy: yes.  Menopausal status: postmenopausal.  HRT use: 0 years. Colonoscopy: yes; normal. Number of breast biopsies: 2.  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[facility])    Past Surgical History:  Procedure Laterality Date   BREAST BIOPSY Left    BREAST BIOPSY Left    DILATION AND CURETTAGE OF UTERUS     DILITATION & CURRETTAGE/HYSTROSCOPY WITH VERSAPOINT RESECTION N/A 12/26/2012   Procedure: DILATATION & CURETTAGE/HYSTEROSCOPY WITH VERSAPOINT RESECTION;  Surgeon: Princess Bruins, MD;  Location: [facility];  Service: Gynecology;  Laterality: N/A;   IR IMAGING GUIDED PORT INSERTION  06/09/2022   JOINT REPLACEMENT     MANDIBLE FRACTURE SURGERY     ROBOTIC ASSISTED BILATERAL SALPINGO OOPHERECTOMY Bilateral 05/10/2022   Procedure: XI ROBOTIC ASSISTED BILATERAL SALPINGO OOPHORECTOMY, TOTAL HYSTERECTOMY, CYSTOSCOPY;  Surgeon: Lafonda Mosses, MD;  Location: [facility];  Service: Gynecology;  Laterality: Bilateral;   TOTAL KNEE ARTHROPLASTY Bilateral     FAMILY HISTORY:  We obtained a detailed, 4-generation family history.  Significant diagnoses are listed below: Family History  Problem Relation Age of Onset   Heart disease Maternal Grandmother    Heart disease Maternal Grandfather    Stroke Maternal Grandfather    Colon cancer Maternal Grandfather        dx 27s   Breast cancer Other    Cancer Half-Brother        in spine   Ovarian cancer Neg Hx  Endometrial cancer Neg Hx    Pancreatic cancer Neg Hx    Prostate cancer Neg Hx    Ms. Ledee has 1 daughter, [name], and 2 sons. She has 1 full brother, 1 maternal half brother. Her half brother has a tumor on his spine that he is being treated with chemotherapy for.   Ms. Siefken mother passed at 87. Maternal grandfather had colon cancer in his 20s. Maternal great aunt had breast cancer.  Ms. Hickok father passed at 48 during war. Patient has no other  information about his side of the family.   Ms. Ayub is unaware of previous family history of genetic testing for hereditary cancer risks. There is no reported Ashkenazi Jewish ancestry. There is no known consanguinity.    GENETIC COUNSELING ASSESSMENT: Ms. Dubuque is a 81 y.o. female with a personal history of fallopian tube cancer which is somewhat suggestive of a hereditary cancer syndrome and predisposition to cancer. We, therefore, discussed and recommended the following at today's visit.   DISCUSSION: We discussed that approximately 10-20% of ovarian/fallopian tube cancer is hereditary. Most cases of hereditary fallopian tube cancer are associated with BRCA2/BRCA2 genes, although there are other genes associated with hereditary cancer as well. Cancers and risks are gene specific. We discussed that testing is beneficial for several reasons including knowing about cancer risks, identifying potential screening and risk-reduction options that may be appropriate, and to understand if other family members could be at risk for cancer and allow them to undergo genetic testing.   We reviewed the characteristics, features and inheritance patterns of hereditary cancer syndromes. We also discussed genetic testing, including the appropriate family members to test, the process of testing, insurance coverage and turn-around-time for results. We discussed the implications of a negative, positive and/or variant of uncertain significant result. We recommended Ms. Deyarmin pursue genetic testing for the [facility] Multi-Cancer+RNA gene panel.   Based on Ms. Arata's personal and family history of cancer, she meets medical criteria for genetic testing. Despite that she meets criteria, she may still have an out of pocket cost. We discussed that if her out of pocket cost for testing is over $100, the laboratory will call and confirm whether she wants to proceed with testing.  If the out of pocket cost of testing is less than  $100 she will be billed by the genetic testing laboratory.   PLAN: After considering the risks, benefits, and limitations, Ms. Pangelinan provided informed consent to pursue genetic testing. She will have blood drawn on 10/20 and the blood sample will be sent to [facility] for analysis of the Multi-Cancer+RNA panel. Results should be available within approximately 2-3 weeks' time, at which point they will be disclosed by telephone to Ms. Devos, as will any additional recommendations warranted by these results. Ms. Arvin will receive a summary of her genetic counseling visit and a copy of her results once available. This information will also be available in Epic.   Ms. Edwards questions were answered to her satisfaction today. Our contact information was provided should additional questions or concerns arise. Thank you for the referral and allowing [place] to share in the care of your patient.   Faith Rogue, MS, [facility] Genetic Counselor [email or web address] Phone: [phone]  The patient was seen for a total of 30 minutes in face-to-face genetic counseling. Patient's daughter [name] was also present. Dr. Grayland Ormond was available for discussion regarding this case.   _______________________________________________________________________ For Office Staff:  Number of people involved in session: 2 Was an Intern/  student involved with case: no

## 2022-07-21 MED FILL — Fosaprepitant Dimeglumine For IV Infusion 150 MG (Base Eq): INTRAVENOUS | Qty: 5 | Status: AC

## 2022-07-21 MED FILL — Dexamethasone Sodium Phosphate Inj 100 MG/10ML: INTRAMUSCULAR | Qty: 1 | Status: AC

## 2022-07-22 ENCOUNTER — Inpatient Hospital Stay: Payer: Medicare Other | Admitting: Hematology and Oncology

## 2022-07-22 ENCOUNTER — Inpatient Hospital Stay: Payer: Medicare Other

## 2022-07-22 ENCOUNTER — Other Ambulatory Visit: Payer: Self-pay

## 2022-07-22 ENCOUNTER — Encounter: Payer: Self-pay | Admitting: Hematology and Oncology

## 2022-07-22 VITALS — BP 152/56 | HR 100 | Temp 97.8°F | Resp 18 | Ht 63.0 in | Wt 224.0 lb

## 2022-07-22 DIAGNOSIS — Z79899 Other long term (current) drug therapy: Secondary | ICD-10-CM | POA: Diagnosis not present

## 2022-07-22 DIAGNOSIS — C5702 Malignant neoplasm of left fallopian tube: Secondary | ICD-10-CM | POA: Diagnosis present

## 2022-07-22 DIAGNOSIS — D61818 Other pancytopenia: Secondary | ICD-10-CM

## 2022-07-22 DIAGNOSIS — N183 Chronic kidney disease, stage 3 unspecified: Secondary | ICD-10-CM

## 2022-07-22 LAB — CMP (CANCER CENTER ONLY)
ALT: 13 U/L (ref 0–44)
AST: 16 U/L (ref 15–41)
Albumin: 4 g/dL (ref 3.5–5.0)
Alkaline Phosphatase: 85 U/L (ref 38–126)
Anion gap: 12 (ref 5–15)
BUN: 49 mg/dL — ABNORMAL HIGH (ref 8–23)
CO2: 24 mmol/L (ref 22–32)
Calcium: 9.8 mg/dL (ref 8.9–10.3)
Chloride: 102 mmol/L (ref 98–111)
Creatinine: 1.36 mg/dL — ABNORMAL HIGH (ref 0.44–1.00)
GFR, Estimated: 39 mL/min — ABNORMAL LOW (ref >60)
Glucose, Bld: 196 mg/dL — ABNORMAL HIGH (ref 70–99)
Potassium: 4.7 mmol/L (ref 3.5–5.1)
Sodium: 138 mmol/L (ref 135–145)
Total Bilirubin: 0.5 mg/dL (ref 0.3–1.2)
Total Protein: 6.8 g/dL (ref 6.5–8.1)

## 2022-07-22 LAB — CBC WITH DIFFERENTIAL (CANCER CENTER ONLY)
Abs Immature Granulocytes: 0.16 10*3/uL — ABNORMAL HIGH (ref 0.00–0.07)
Basophils Absolute: 0 10*3/uL (ref 0.0–0.1)
Basophils Relative: 0 %
Eosinophils Absolute: 0 10*3/uL (ref 0.0–0.5)
Eosinophils Relative: 0 %
HCT: 26.1 % — ABNORMAL LOW (ref 36.0–46.0)
Hemoglobin: 8.8 g/dL — ABNORMAL LOW (ref 12.0–15.0)
Immature Granulocytes: 2 %
Lymphocytes Relative: 17 %
Lymphs Abs: 1.2 10*3/uL (ref 0.7–4.0)
MCH: 32.5 pg (ref 26.0–34.0)
MCHC: 33.7 g/dL (ref 30.0–36.0)
MCV: 96.3 fL (ref 80.0–100.0)
Monocytes Absolute: 0.1 10*3/uL (ref 0.1–1.0)
Monocytes Relative: 2 %
Neutro Abs: 5.5 10*3/uL (ref 1.7–7.7)
Neutrophils Relative %: 79 %
Platelet Count: 55 10*3/uL — ABNORMAL LOW (ref 150–400)
RBC: 2.71 MIL/uL — ABNORMAL LOW (ref 3.87–5.11)
RDW: 17.2 % — ABNORMAL HIGH (ref 11.5–15.5)
WBC Count: 6.9 10*3/uL (ref 4.0–10.5)
nRBC: 0.4 % — ABNORMAL HIGH (ref 0.0–0.2)

## 2022-07-22 NOTE — Assessment & Plan Note (Signed)
This is due to side effects of treatment She does not need transfusion support I plan to reduce the dose of chemotherapy further in the future and defer her treatment today

## 2022-07-22 NOTE — Progress Notes (Signed)
[facility] OFFICE PROGRESS NOTE  Patient Care Team: Margretta Sidle, MD as PCP - Cristino Martes, MD (Inactive) as Consulting Physician (Dermatology) Lavonna Monarch, MD (Inactive) as Consulting Physician (Dermatology)  ASSESSMENT & PLAN:  Fallopian tube cancer, carcinoma, left ([place]) Even though she tolerated treatment well, blood counts show severe pancytopenia Thankfully, she is not profoundly symptomatic I recommend we defer treatment for at least a week Due to her preference, we will reschedule chemo to 2 weeks from now We will also reduce the dose of both chemotherapy to reduce the risk of severe pancytopenia in the future She is in agreement with the plan of care  Pancytopenia, acquired [facility]) This is due to side effects of treatment She does not need transfusion support I plan to reduce the dose of chemotherapy further in the future and defer her treatment today  CKD (chronic kidney disease), stage III ([place]) We discussed risk factors for worsening kidney failure We discussed importance of adequate hydration and will adjust dose of chemo accordingly  Orders Placed This Encounter  Procedures   CBC with Differential ([facility] Only)    Standing Status:   Future    Standing Expiration Date:   08/06/2023   CMP ([place] only)    Standing Status:   Future    Standing Expiration Date:   08/06/2023    All questions were answered. The patient knows to call the clinic with any problems, questions or concerns. The total time spent in the appointment was 40 minutes encounter with patients including review of chart and various tests results, discussions about plan of care and coordination of care plan   Heath Lark, MD 07/22/2022 9:37 AM  INTERVAL HISTORY: Please see below for problem oriented charting. she returns for treatment follow-up seen prior to cycle 3 of therapy She tolerated recent treatment well Denies recent bleeding She has some minor  skin bruising Denies nausea or changes in bowel habits No worsening peripheral neuropathy  REVIEW OF SYSTEMS:   Constitutional: Denies fevers, chills or abnormal weight loss Eyes: Denies blurriness of vision Ears, nose, mouth, throat, and face: Denies mucositis or sore throat Respiratory: Denies cough, dyspnea or wheezes Cardiovascular: Denies palpitation, chest discomfort or lower extremity swelling Gastrointestinal:  Denies nausea, heartburn or change in bowel habits Skin: Denies abnormal skin rashes Lymphatics: Denies new lymphadenopathy  Neurological:Denies numbness, tingling or new weaknesses Behavioral/Psych: Mood is stable, no new changes  All other systems were reviewed with the patient and are negative.  I have reviewed the past medical history, past surgical history, social history and family history with the patient and they are unchanged from previous note.  ALLERGIES:  is allergic to augmentin [amoxicillin-pot clavulanate] and ivp dye [iodinated contrast media].  MEDICATIONS:  Current Outpatient Medications  Medication Sig Dispense Refill   acetaminophen (TYLENOL) 500 MG tablet Take 500-1,000 mg by mouth every 6 (six) hours as needed (pain.).     allopurinol (ZYLOPRIM) 300 MG tablet Take 300 mg by mouth at bedtime.     amLODipine (NORVASC) 5 MG tablet Take 5 mg by mouth at bedtime.     atenolol (TENORMIN) 50 MG tablet Take 100 mg by mouth at bedtime.     atorvastatin (LIPITOR) 80 MG tablet Take 80 mg by mouth every other day. At night     cholecalciferol (VITAMIN D3) 25 MCG (1000 UNIT) tablet Take 2,000 Units by mouth at bedtime.     citalopram (CELEXA) 10 MG tablet Take 10 mg by mouth  in the morning.     dexamethasone (DECADRON) 4 MG tablet Take 2 tabs at the night before and 2 tab the morning of chemotherapy, every 3 weeks, by mouth x 6 cycles (Patient not taking: Reported on 05/31/2022) 36 tablet 6   divalproex (DEPAKOTE) 250 MG DR tablet Take 750 mg by mouth in the  morning.     furosemide (LASIX) 20 MG tablet Take 40 mg by mouth in the morning.     glipiZIDE (GLUCOTROL) 5 MG tablet Take 5 mg by mouth in the morning.     Lancets (ONETOUCH DELICA PLUS [id number]) MISC Apply topically daily.     levothyroxine (SYNTHROID) 50 MCG tablet Take 50 mcg by mouth daily before breakfast.     lidocaine-prilocaine (EMLA) cream Apply to affected area once 30 g 3   miconazole (BAZA ANTIFUNGAL) 2 % cream Apply 1 Application topically 2 (two) times daily as needed (skin irritation.).     ondansetron (ZOFRAN) 8 MG tablet Take 1 tablet (8 mg total) by mouth every 8 (eight) hours as needed for nausea or vomiting. Start on the third day after chemotherapy. (Patient not taking: Reported on 05/31/2022) 30 tablet 1   prochlorperazine (COMPAZINE) 10 MG tablet Take 1 tablet (10 mg total) by mouth every 6 (six) hours as needed for nausea or vomiting. (Patient not taking: Reported on 05/31/2022) 30 tablet 1   No current facility-administered medications for this visit.    SUMMARY OF ONCOLOGIC HISTORY: Oncology History Overview Note  HER2 negative, High grade serous   Fallopian tube cancer, carcinoma, left ([place])  03/29/2022 Imaging   MRI of the abdomen and pelvis reveals a left adnexal/ovarian mass measuring 5.5 x 2.8 x 3.7 cm with solid diffuse enhancement and T2 signal characteristics and intermediate to accentuated T1 signal characteristics.  No significant cystic component or ascites.  Renal lesion of the right kidney lower pole is thought to represent a benign lesion.  1.3 cm cystic lesion of the head of the pancreas is recommended to be follow-up with pancreatic MRI in 2 years.   05/02/2022 Tumor Marker   Patient's tumor was tested for the following markers: CA-125. Results of the tumor marker test revealed 69.   05/10/2022 Surgery   Robotic-assisted laparoscopic total hysterectomy with bilateral salpingo-oophorectomy, lysis of adhesions, cystoscopy  At least stage IIB carcinoma  of the fallopian tube, suspected   Findings:  On EUA, mildly enlarged, somewhat mobile uterus. Fullness appreciated within the left cul de sac. On intra-abdominal entry, normal upper abdominal survey including liver edge, diaphragm, and stomach. Normal appearing omentum. No ascites. Normal appearing small bowel. Significant inflammation within the pelvis concerning initially for recent diverticulitis versus endometriosis. Thickened and inflamed pelvic peritoneum. Filmy adhesions between the anterior cul de sac and uterus, between the right adnexa and and the sigmoid colon. Right adnexa inflammed but otherwise normal in appearing. Left fallopian tube dilated with evidence of hydrosalpinx. Friable adhesions of the fallopian tube to the sigmoid mesentery, left ovary and left broad ligament. No ascites. Uterus bulbous and 10cm with findings of endosalpingiosis versus endometriosis on much of the uterine serosa. Dense adhesions between the bladder and cervix. Cul de sac without evidence of disease. No ascites.  Cystoscopy findings: bladder dome intact, normal efflux from bilateral ureteral orifices. On frozen section, no abnormality of the endometrium. Anterior cul de sac adhesion with findings of hemosiderin, no definitive malignancy. Fallopian tube with malignancy.  Given findings and pelvic peritoneal involvement given fallopian tube adherent to surrounding structures, no  upper abdominal findings, normal omentum, and normal neuroassessment intraoperatively as well as on preoperative imaging, lymphadenectomy deferred.   05/10/2022 Pathology Results   Procedure: Hysterectomy with bilateral salpingo-oophorectomy  Specimen Integrity: Intact  Tumor Site: Left fallopian tube  Tumor Size: 6.3 x 3.8 x 2.8 cm  Histologic Type: Serous carcinoma  Histologic Grade: High-grade  Ovarian Surface Involvement: Not identified  Fallopian Tube Surface Involvement: Not identified  Implants (required for advanced stage  serous/seromucinous borderline  tumors only): Not applicable  Other Tissue/ Organ Involvement: Not applicable  Largest Extrapelvic Peritoneal Focus: Not applicable  Peritoneal/Ascitic Fluid Involvement: Not identified [facility] [id number])  Chemotherapy Response Score (CRS): Not applicable, no known presurgical  therapy  Regional Lymph Nodes: Not applicable (no lymph nodes submitted or found)  Distant Metastasis: Not applicable  Pathologic Stage Classification (pTNM, AJCC 8th Edition): pT1a, pN n/a  Ancillary Studies: Available upon request  Representative Tumor Block: B15  Comment(s): An immunohistochemistry stain for p53 is performed with  adequate control and is diffusely positive within the tumor.  The same  stain shows patchy variable staining within the reactive mesothelium  lining the serosal adhesions within the anterior cul-de-sac    05/17/2022 Initial Diagnosis   Fallopian tube cancer, carcinoma, left ([facility])   05/30/2022 Cancer Staging   Staging form: Ovary, Fallopian Tube, and Primary Peritoneal Carcinoma, AJCC 8th Edition - Pathologic stage from 05/30/2022: FIGO Stage IIB (pT2b, pN0, cM0) - Signed by Artis Delay, MD on 05/30/2022 Stage prefix: Initial diagnosis   06/10/2022 -  Chemotherapy   Patient is on Treatment Plan : OVARIAN Carboplatin (AUC 6) + Paclitaxel (175) q21d X 6 Cycles     06/10/2022 Procedure   Placement of a subcutaneous power-injectable port device. Catheter tip at the superior cavoatrial junction.       PHYSICAL EXAMINATION: ECOG PERFORMANCE STATUS: 1 - Symptomatic but completely ambulatory  Vitals:   07/22/22 0822  BP: (!) 152/56  Pulse: 100  Resp: 18  Temp: 97.8 F (36.6 C)  SpO2: 100%   Filed Weights   07/22/22 0822  Weight: 224 lb (101.6 kg)    GENERAL:alert, no distress and comfortable NEURO: alert & oriented x 3 with fluent speech, no focal motor/sensory deficits  LABORATORY DATA:  I have reviewed the data as listed    Component Value  Date/Time   NA 138 07/22/2022 0748   K 4.7 07/22/2022 0748   CL 102 07/22/2022 0748   CO2 24 07/22/2022 0748   GLUCOSE 196 (H) 07/22/2022 0748   BUN 49 (H) 07/22/2022 0748   CREATININE 1.36 (H) 07/22/2022 0748   CALCIUM 9.8 07/22/2022 0748   PROT 6.8 07/22/2022 0748   ALBUMIN 4.0 07/22/2022 0748   AST 16 07/22/2022 0748   ALT 13 07/22/2022 0748   ALKPHOS 85 07/22/2022 0748   BILITOT 0.5 07/22/2022 0748   GFRNONAA 39 (L) 07/22/2022 0748   GFRAA 57 (L) 12/25/2012 0850    No results found for: "SPEP", "UPEP"  Lab Results  Component Value Date   WBC 6.9 07/22/2022   NEUTROABS 5.5 07/22/2022   HGB 8.8 (L) 07/22/2022   HCT 26.1 (L) 07/22/2022   MCV 96.3 07/22/2022   PLT 55 (L) 07/22/2022      Chemistry      Component Value Date/Time   NA 138 07/22/2022 0748   K 4.7 07/22/2022 0748   CL 102 07/22/2022 0748   CO2 24 07/22/2022 0748   BUN 49 (H) 07/22/2022 0748   CREATININE 1.36 (H) 07/22/2022 1317  Component Value Date/Time   CALCIUM 9.8 07/22/2022 0748   ALKPHOS 85 07/22/2022 0748   AST 16 07/22/2022 0748   ALT 13 07/22/2022 0748   BILITOT 0.5 07/22/2022 0748

## 2022-07-22 NOTE — Assessment & Plan Note (Signed)
Even though she tolerated treatment well, blood counts show severe pancytopenia Thankfully, she is not profoundly symptomatic I recommend we defer treatment for at least a week Due to her preference, we will reschedule chemo to 2 weeks from now We will also reduce the dose of both chemotherapy to reduce the risk of severe pancytopenia in the future She is in agreement with the plan of care

## 2022-07-22 NOTE — Assessment & Plan Note (Signed)
We discussed risk factors for worsening kidney failure We discussed importance of adequate hydration and will adjust dose of chemo accordingly 

## 2022-07-23 ENCOUNTER — Other Ambulatory Visit: Payer: Self-pay

## 2022-08-04 MED FILL — Fosaprepitant Dimeglumine For IV Infusion 150 MG (Base Eq): INTRAVENOUS | Qty: 5 | Status: AC

## 2022-08-04 MED FILL — Dexamethasone Sodium Phosphate Inj 100 MG/10ML: INTRAMUSCULAR | Qty: 1 | Status: AC

## 2022-08-04 NOTE — Assessment & Plan Note (Addendum)
Her platelet count is fully recovered She has persistent anemia, improved compared to last week's hemoglobin I have planned to reduce the dose of both chemotherapy to reduce the risk of severe pancytopenia in the future She is in agreement with the plan of care

## 2022-08-05 ENCOUNTER — Other Ambulatory Visit: Payer: Self-pay

## 2022-08-05 ENCOUNTER — Inpatient Hospital Stay: Payer: Medicare Other | Attending: Gynecologic Oncology

## 2022-08-05 ENCOUNTER — Inpatient Hospital Stay: Payer: Medicare Other

## 2022-08-05 ENCOUNTER — Encounter: Payer: Self-pay | Admitting: Hematology and Oncology

## 2022-08-05 ENCOUNTER — Inpatient Hospital Stay: Payer: Medicare Other | Admitting: Hematology and Oncology

## 2022-08-05 VITALS — BP 144/47 | HR 70 | Temp 98.3°F | Resp 18 | Ht 63.0 in | Wt 229.4 lb

## 2022-08-05 VITALS — BP 136/52 | HR 75 | Temp 98.1°F | Resp 18

## 2022-08-05 DIAGNOSIS — N183 Chronic kidney disease, stage 3 unspecified: Secondary | ICD-10-CM

## 2022-08-05 DIAGNOSIS — D61818 Other pancytopenia: Secondary | ICD-10-CM | POA: Diagnosis not present

## 2022-08-05 DIAGNOSIS — W19XXXA Unspecified fall, initial encounter: Secondary | ICD-10-CM | POA: Insufficient documentation

## 2022-08-05 DIAGNOSIS — C5702 Malignant neoplasm of left fallopian tube: Secondary | ICD-10-CM

## 2022-08-05 DIAGNOSIS — Z5111 Encounter for antineoplastic chemotherapy: Secondary | ICD-10-CM | POA: Insufficient documentation

## 2022-08-05 DIAGNOSIS — Z79899 Other long term (current) drug therapy: Secondary | ICD-10-CM | POA: Insufficient documentation

## 2022-08-05 LAB — CBC WITH DIFFERENTIAL (CANCER CENTER ONLY)
Abs Immature Granulocytes: 0.07 10*3/uL (ref 0.00–0.07)
Basophils Absolute: 0 10*3/uL (ref 0.0–0.1)
Basophils Relative: 0 %
Eosinophils Absolute: 0 10*3/uL (ref 0.0–0.5)
Eosinophils Relative: 0 %
HCT: 28.4 % — ABNORMAL LOW (ref 36.0–46.0)
Hemoglobin: 9.5 g/dL — ABNORMAL LOW (ref 12.0–15.0)
Immature Granulocytes: 1 %
Lymphocytes Relative: 12 %
Lymphs Abs: 1 10*3/uL (ref 0.7–4.0)
MCH: 33.1 pg (ref 26.0–34.0)
MCHC: 33.5 g/dL (ref 30.0–36.0)
MCV: 99 fL (ref 80.0–100.0)
Monocytes Absolute: 0.1 10*3/uL (ref 0.1–1.0)
Monocytes Relative: 1 %
Neutro Abs: 7.2 10*3/uL (ref 1.7–7.7)
Neutrophils Relative %: 86 %
Platelet Count: 291 10*3/uL (ref 150–400)
RBC: 2.87 MIL/uL — ABNORMAL LOW (ref 3.87–5.11)
RDW: 19.6 % — ABNORMAL HIGH (ref 11.5–15.5)
WBC Count: 8.4 10*3/uL (ref 4.0–10.5)
nRBC: 0.2 % (ref 0.0–0.2)

## 2022-08-05 LAB — CMP (CANCER CENTER ONLY)
ALT: 11 U/L (ref 0–44)
AST: 13 U/L — ABNORMAL LOW (ref 15–41)
Albumin: 4 g/dL (ref 3.5–5.0)
Alkaline Phosphatase: 83 U/L (ref 38–126)
Anion gap: 12 (ref 5–15)
BUN: 44 mg/dL — ABNORMAL HIGH (ref 8–23)
CO2: 24 mmol/L (ref 22–32)
Calcium: 9.8 mg/dL (ref 8.9–10.3)
Chloride: 100 mmol/L (ref 98–111)
Creatinine: 1.27 mg/dL — ABNORMAL HIGH (ref 0.44–1.00)
GFR, Estimated: 42 mL/min — ABNORMAL LOW (ref >60)
Glucose, Bld: 208 mg/dL — ABNORMAL HIGH (ref 70–99)
Potassium: 4.8 mmol/L (ref 3.5–5.1)
Sodium: 136 mmol/L (ref 135–145)
Total Bilirubin: 0.4 mg/dL (ref 0.3–1.2)
Total Protein: 6.9 g/dL (ref 6.5–8.1)

## 2022-08-05 MED ORDER — FAMOTIDINE IN NACL 20-0.9 MG/50ML-% IV SOLN
20.0000 mg | Freq: Once | INTRAVENOUS | Status: AC
  Administered 2022-08-05: 20 mg via INTRAVENOUS
  Filled 2022-08-05: qty 50

## 2022-08-05 MED ORDER — PALONOSETRON HCL INJECTION 0.25 MG/5ML
0.2500 mg | Freq: Once | INTRAVENOUS | Status: AC
  Administered 2022-08-05: 0.25 mg via INTRAVENOUS
  Filled 2022-08-05: qty 5

## 2022-08-05 MED ORDER — CETIRIZINE HCL 10 MG/ML IV SOLN
10.0000 mg | Freq: Once | INTRAVENOUS | Status: AC
  Administered 2022-08-05: 10 mg via INTRAVENOUS
  Filled 2022-08-05: qty 1

## 2022-08-05 MED ORDER — SODIUM CHLORIDE 0.9 % IV SOLN
10.0000 mg | Freq: Once | INTRAVENOUS | Status: AC
  Administered 2022-08-05: 10 mg via INTRAVENOUS
  Filled 2022-08-05: qty 1
  Filled 2022-08-05: qty 10

## 2022-08-05 MED ORDER — SODIUM CHLORIDE 0.9 % IV SOLN
398.5000 mg | Freq: Once | INTRAVENOUS | Status: AC
  Administered 2022-08-05: 400 mg via INTRAVENOUS
  Filled 2022-08-05: qty 40

## 2022-08-05 MED ORDER — SODIUM CHLORIDE 0.9 % IV SOLN
150.0000 mg | Freq: Once | INTRAVENOUS | Status: AC
  Administered 2022-08-05: 150 mg via INTRAVENOUS
  Filled 2022-08-05: qty 5
  Filled 2022-08-05: qty 150

## 2022-08-05 MED ORDER — SODIUM CHLORIDE 0.9% FLUSH
10.0000 mL | Freq: Once | INTRAVENOUS | Status: AC
  Administered 2022-08-05: 10 mL

## 2022-08-05 MED ORDER — SODIUM CHLORIDE 0.9 % IV SOLN
87.5000 mg/m2 | Freq: Once | INTRAVENOUS | Status: AC
  Administered 2022-08-05: 186 mg via INTRAVENOUS
  Filled 2022-08-05: qty 31

## 2022-08-05 MED ORDER — SODIUM CHLORIDE 0.9 % IV SOLN: Freq: Once | INTRAVENOUS | Status: AC

## 2022-08-05 MED ORDER — SODIUM CHLORIDE 0.9% FLUSH
10.0000 mL | INTRAVENOUS | Status: DC | PRN
  Administered 2022-08-05: 10 mL

## 2022-08-05 MED ORDER — HEPARIN SOD (PORK) LOCK FLUSH 100 UNIT/ML IV SOLN
500.0000 [IU] | Freq: Once | INTRAVENOUS | Status: AC | PRN
  Administered 2022-08-05: 500 [IU]

## 2022-08-05 NOTE — Assessment & Plan Note (Addendum)
She fell this morning when she tried to pick up something from the floor She hit her right knee Examination showed no evidence of effusion, dislocation or abnormalities except for minor discomfort on palpation She does not need further evaluation or x-ray We discussed fall prevention strategies

## 2022-08-05 NOTE — Patient Instructions (Signed)
[facility] MEDICAL ONCOLOGY  Discharge Instructions: Thank you for choosing [facility] to provide your oncology and hematology care.   If you have a lab appointment with the Cancer Center, please go directly to the Cancer Center and check in at the registration area.   Wear comfortable clothing and clothing appropriate for easy access to any Portacath or PICC line.   We strive to give you quality time with your provider. You may need to reschedule your appointment if you arrive late (15 or more minutes).  Arriving late affects you and other patients whose appointments are after yours.  Also, if you miss three or more appointments without notifying the office, you may be dismissed from the clinic at the provider's discretion.      For prescription refill requests, have your pharmacy contact our office and allow 72 hours for refills to be completed.    Today you received the following chemotherapy and/or immunotherapy agents: paclitaxel and carboplatin      To help prevent nausea and vomiting after your treatment, we encourage you to take your nausea medication as directed.  BELOW ARE SYMPTOMS THAT SHOULD BE REPORTED IMMEDIATELY: *FEVER GREATER THAN 100.4 F (38 C) OR HIGHER *CHILLS OR SWEATING *NAUSEA AND VOMITING THAT IS NOT CONTROLLED WITH YOUR NAUSEA MEDICATION *UNUSUAL SHORTNESS OF BREATH *UNUSUAL BRUISING OR BLEEDING *URINARY PROBLEMS (pain or burning when urinating, or frequent urination) *BOWEL PROBLEMS (unusual diarrhea, constipation, pain near the anus) TENDERNESS IN MOUTH AND THROAT WITH OR WITHOUT PRESENCE OF ULCERS (sore throat, sores in mouth, or a toothache) UNUSUAL RASH, SWELLING OR PAIN  UNUSUAL VAGINAL DISCHARGE OR ITCHING   Items with * indicate a potential emergency and should be followed up as soon as possible or go to the Emergency Department if any problems should occur.  Please show the CHEMOTHERAPY ALERT CARD or IMMUNOTHERAPY ALERT  CARD at check-in to the Emergency Department and triage nurse.  Should you have questions after your visit or need to cancel or reschedule your appointment, please contact [facility] MEDICAL ONCOLOG[facility]  Dept: [phone]  and follow the prompts.  Office hours are 8:00 a.m. to 4:30 p.m. Monday - Friday. Please note that voicemails left after 4:00 p.m. may not be returned until the following business day.  We are closed weekends and major holidays. You have access to a nurse at all times for urgent questions. Please call the main number to the clinic Dept: [phone] and follow the prompts.   For any non-urgent questions, you may also contact your provider using MyChart. We now offer e-Visits for anyone 18 and older to request care online for non-urgent symptoms. For details visit [email or web address].   Also download the MyChart app! Go to the app store, search "MyChart", open the app, select [facility], and log in with your MyChart username and password.  Masks are optional in the cancer centers. If you would like for your care team to wear a mask while they are taking care of you, please let them know. You may have one support person who is at least 82 years old accompany you for your appointments. 

## 2022-08-05 NOTE — Assessment & Plan Note (Signed)
We discussed risk factors for worsening kidney failure We discussed importance of adequate hydration and will adjust dose of chemo accordingly

## 2022-08-05 NOTE — Progress Notes (Signed)
[facility] OFFICE PROGRESS NOTE  Patient Care Team: Margretta Sidle, MD as PCP - Cristino Martes, MD (Inactive) as Consulting Physician (Dermatology) Lavonna Monarch, MD (Inactive) as Consulting Physician (Dermatology)  ASSESSMENT & PLAN:  Fallopian tube cancer, carcinoma, left ([place]) Her platelet count is fully recovered She has persistent anemia, improved compared to last week's hemoglobin I have planned to reduce the dose of both chemotherapy to reduce the risk of severe pancytopenia in the future She is in agreement with the plan of care    CKD (chronic kidney disease), stage III ([place]) We discussed risk factors for worsening kidney failure We discussed importance of adequate hydration and will adjust dose of chemo accordingly  Pancytopenia, acquired ([place]) Her pancytopenia has improved Observe closely She does not need transfusion support  Fall She fell this morning when she tried to pick up something from the floor She hit her right knee Examination showed no evidence of effusion, dislocation or abnormalities except for minor discomfort on palpation She does not need further evaluation or x-ray We discussed fall prevention strategies  No orders of the defined types were placed in this encounter.   All questions were answered. The patient knows to call the clinic with any problems, questions or concerns. The total time spent in the appointment was 30 minutes encounter with patients including review of chart and various tests results, discussions about plan of care and coordination of care plan   Heath Lark, MD 08/05/2022 9:04 AM  INTERVAL HISTORY: Please see below for problem oriented charting. she returns for treatment follow-up with her daughter She felt great last week No recent bleeding She fell this morning when she tried to pick up an object She did not have her [employer] alarm with her but family members were around She had her right knee  but denies major injuries Denies syncopal episode  REVIEW OF SYSTEMS:   Constitutional: Denies fevers, chills or abnormal weight loss Eyes: Denies blurriness of vision Ears, nose, mouth, throat, and face: Denies mucositis or sore throat Respiratory: Denies cough, dyspnea or wheezes Cardiovascular: Denies palpitation, chest discomfort or lower extremity swelling Gastrointestinal:  Denies nausea, heartburn or change in bowel habits Skin: Denies abnormal skin rashes Lymphatics: Denies new lymphadenopathy or easy bruising Neurological:Denies numbness, tingling or new weaknesses Behavioral/Psych: Mood is stable, no new changes  All other systems were reviewed with the patient and are negative.  I have reviewed the past medical history, past surgical history, social history and family history with the patient and they are unchanged from previous note.  ALLERGIES:  is allergic to augmentin [amoxicillin-pot clavulanate] and ivp dye [iodinated contrast media].  MEDICATIONS:  Current Outpatient Medications  Medication Sig Dispense Refill   acetaminophen (TYLENOL) 500 MG tablet Take 500-1,000 mg by mouth every 6 (six) hours as needed (pain.).     allopurinol (ZYLOPRIM) 300 MG tablet Take 300 mg by mouth at bedtime.     amLODipine (NORVASC) 5 MG tablet Take 5 mg by mouth at bedtime.     atenolol (TENORMIN) 50 MG tablet Take 100 mg by mouth at bedtime.     atorvastatin (LIPITOR) 80 MG tablet Take 80 mg by mouth every other day. At night     cholecalciferol (VITAMIN D3) 25 MCG (1000 UNIT) tablet Take 2,000 Units by mouth at bedtime.     citalopram (CELEXA) 10 MG tablet Take 10 mg by mouth in the morning.     dexamethasone (DECADRON) 4 MG tablet Take 2 tabs at  the night before and 2 tab the morning of chemotherapy, every 3 weeks, by mouth x 6 cycles (Patient not taking: Reported on 05/31/2022) 36 tablet 6   divalproex (DEPAKOTE) 250 MG DR tablet Take 750 mg by mouth in the morning.     furosemide  (LASIX) 20 MG tablet Take 40 mg by mouth in the morning.     glipiZIDE (GLUCOTROL) 5 MG tablet Take 5 mg by mouth in the morning.     Lancets (ONETOUCH DELICA PLUS [id number]) MISC Apply topically daily.     levothyroxine (SYNTHROID) 50 MCG tablet Take 50 mcg by mouth daily before breakfast.     lidocaine-prilocaine (EMLA) cream Apply to affected area once 30 g 3   miconazole (BAZA ANTIFUNGAL) 2 % cream Apply 1 Application topically 2 (two) times daily as needed (skin irritation.).     ondansetron (ZOFRAN) 8 MG tablet Take 1 tablet (8 mg total) by mouth every 8 (eight) hours as needed for nausea or vomiting. Start on the third day after chemotherapy. (Patient not taking: Reported on 05/31/2022) 30 tablet 1   prochlorperazine (COMPAZINE) 10 MG tablet Take 1 tablet (10 mg total) by mouth every 6 (six) hours as needed for nausea or vomiting. (Patient not taking: Reported on 05/31/2022) 30 tablet 1   No current facility-administered medications for this visit.   Facility-Administered Medications Ordered in Other Visits  Medication Dose Route Frequency Provider Last Rate Last Admin   CARBOplatin (PARAPLATIN) 400 mg in sodium chloride 0.9 % 250 mL chemo infusion  400 mg Intravenous Once Alvy Bimler, Troyce Febo, MD       cetirizine (QUZYTTIR) injection 10 mg  10 mg Intravenous Once Alvy Bimler, Johndavid Geralds, MD       dexamethasone (DECADRON) 10 mg in sodium chloride 0.9 % 50 mL IVPB  10 mg Intravenous Once Heath Lark, MD 204 mL/hr at 08/05/22 0856 10 mg at 08/05/22 0856   famotidine (PEPCID) IVPB 20 mg premix  20 mg Intravenous Once Alvy Bimler, Meridian Scherger, MD       fosaprepitant (EMEND) 150 mg in sodium chloride 0.9 % 145 mL IVPB  150 mg Intravenous Once Heath Lark, MD 450 mL/hr at 08/05/22 0901 150 mg at 08/05/22 0901   heparin lock flush 100 unit/mL  500 Units Intracatheter Once PRN Alvy Bimler, Verle Wheeling, MD       PACLitaxel (TAXOL) 186 mg in sodium chloride 0.9 % 250 mL chemo infusion (> 41m/m2)  87.5 mg/m2 (Treatment Plan Recorded) Intravenous  Once GAlvy Bimler Dareld Mcauliffe, MD       palonosetron (ALOXI) injection 0.25 mg  0.25 mg Intravenous Once GAlvy Bimler Bergen Magner, MD       sodium chloride flush (NS) 0.9 % injection 10 mL  10 mL Intracatheter PRN GHeath Lark MD        SUMMARY OF ONCOLOGIC HISTORY: Oncology History Overview Note  HER2 negative, High grade serous   Fallopian tube cancer, carcinoma, left ([place]  03/29/2022 Imaging   MRI of the abdomen and pelvis reveals a left adnexal/ovarian mass measuring 5.5 x 2.8 x 3.7 cm with solid diffuse enhancement and T2 signal characteristics and intermediate to accentuated T1 signal characteristics.  No significant cystic component or ascites.  Renal lesion of the right kidney lower pole is thought to represent a benign lesion.  1.3 cm cystic lesion of the head of the pancreas is recommended to be follow-up with pancreatic MRI in 2 years.   05/02/2022 Tumor Marker   Patient's tumor was tested for the following markers: CA-125. Results of the tumor  marker test revealed 69.   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 Fullness appreciated within the left cul de sac. On intra-abdominal entry, normal upper abdominal survey including liver edge, diaphragm, and stomach. Normal appearing omentum. No ascites. Normal appearing small bowel. Significant inflammation within the pelvis concerning initially for recent diverticulitis versus endometriosis. Thickened and inflamed pelvic peritoneum. Filmy adhesions between the anterior cul de sac and uterus, between the right adnexa and and the sigmoid colon. Right adnexa inflammed but otherwise normal in appearing. Left fallopian tube dilated with evidence of hydrosalpinx. Friable adhesions of the fallopian tube to the sigmoid mesentery, left ovary and left broad  ligament. No ascites. Uterus bulbous and 10cm with findings of endosalpingiosis versus endometriosis on much of the uterine serosa. Dense adhesions between the bladder and cervix. Cul de sac without evidence of disease. No ascites.  Cystoscopy findings: bladder dome intact, normal efflux from bilateral ureteral orifices. On frozen section, no abnormality of the endometrium. Anterior cul de sac adhesion with findings of hemosiderin, no definitive malignancy. Fallopian tube with malignancy.  Given findings and pelvic peritoneal involvement given fallopian tube adherent to surrounding structures, no upper abdominal findings, normal omentum, and normal neuroassessment intraoperatively as well as on preoperative imaging, lymphadenectomy deferred.   05/10/2022 Pathology Results   Procedure: Hysterectomy with bilateral salpingo-oophorectomy  Specimen Integrity: Intact  Tumor Site: Left fallopian tube  Tumor Size: 6.3 x 3.8 x 2.8 cm  Histologic Type: Serous carcinoma  Histologic Grade: High-grade  Ovarian Surface Involvement: Not identified  Fallopian Tube Surface Involvement: Not identified  Implants (required for advanced stage serous/seromucinous borderline  tumors only): Not applicable  Other Tissue/ Organ Involvement: Not applicable  Largest Extrapelvic Peritoneal Focus: Not applicable  Peritoneal/Ascitic Fluid Involvement: Not identified [facility] [phone])  Chemotherapy Response Score (CRS): Not applicable, no known presurgical  therapy  Regional Lymph Nodes: Not applicable (no lymph nodes submitted or found)  Distant Metastasis: Not applicable  Pathologic Stage Classification (pTNM, AJCC 8th Edition): pT1a, pN n/a  Ancillary Studies: Available upon request  Representative Tumor Block: B15  Comment(s): An immunohistochemistry stain for p53 is performed with  adequate control and is diffusely positive within the tumor.  The same  stain shows patchy variable staining within the reactive mesothelium   lining the serosal adhesions within the anterior cul-de-sac    05/17/2022 Initial Diagnosis   Fallopian tube cancer, carcinoma, left ([place])   05/30/2022 Cancer Staging   Staging form: Ovary, Fallopian Tube, and Primary Peritoneal Carcinoma, AJCC 8th Edition - Pathologic stage from 05/30/2022: FIGO Stage IIB (pT2b, pN0, cM0) - Signed by Heath Lark, MD on 05/30/2022 Stage prefix: Initial diagnosis   06/10/2022 -  Chemotherapy   Patient is on Treatment Plan : OVARIAN Carboplatin (AUC 6) + Paclitaxel (175) q21d X 6 Cycles     06/10/2022 Procedure   Placement of a subcutaneous power-injectable port device. Catheter tip at the superior cavoatrial junction.       PHYSICAL EXAMINATION: ECOG PERFORMANCE STATUS: 2 - Symptomatic, <50% confined to bed  Vitals:   08/05/22 0826  BP: (!) 144/47  Pulse: 70  Resp: 18  Temp: 98.3 F (36.8 C)  SpO2: 96%   Filed Weights   08/05/22 0826  Weight: 229 lb 6.4 oz (104.1 kg)    GENERAL:alert, no distress and comfortable SKIN: skin color, texture,  turgor are normal, no rashes or significant lesions EYES: normal, Conjunctiva are pink and non-injected, sclera clear OROPHARYNX:no exudate, no erythema and lips, buccal mucosa, and tongue normal  NECK: supple, thyroid normal size, non-tender, without nodularity LYMPH:  no palpable lymphadenopathy in the cervical, axillary or inguinal LUNGS: clear to auscultation and percussion with normal breathing effort HEART: regular rate & rhythm and no murmurs and no lower extremity edema ABDOMEN:abdomen soft, non-tender and normal bowel sounds Musculoskeletal:no cyanosis of digits and no clubbing.  She has minor pain on palpation on the right knee NEURO: alert & oriented x 3 with fluent speech, no focal motor/sensory deficits  LABORATORY DATA:  I have reviewed the data as listed    Component Value Date/Time   NA 136 08/05/2022 0745   K 4.8 08/05/2022 0745   CL 100 08/05/2022 0745   CO2 24 08/05/2022 0745    GLUCOSE 208 (H) 08/05/2022 0745   BUN 44 (H) 08/05/2022 0745   CREATININE 1.27 (H) 08/05/2022 0745   CALCIUM 9.8 08/05/2022 0745   PROT 6.9 08/05/2022 0745   ALBUMIN 4.0 08/05/2022 0745   AST 13 (L) 08/05/2022 0745   ALT 11 08/05/2022 0745   ALKPHOS 83 08/05/2022 0745   BILITOT 0.4 08/05/2022 0745   GFRNONAA 42 (L) 08/05/2022 0745   GFRAA 57 (L) 12/25/2012 0850    No results found for: "SPEP", "UPEP"  Lab Results  Component Value Date   WBC 8.4 08/05/2022   NEUTROABS 7.2 08/05/2022   HGB 9.5 (L) 08/05/2022   HCT 28.4 (L) 08/05/2022   MCV 99.0 08/05/2022   PLT 291 08/05/2022      Chemistry      Component Value Date/Time   NA 136 08/05/2022 0745   K 4.8 08/05/2022 0745   CL 100 08/05/2022 0745   CO2 24 08/05/2022 0745   BUN 44 (H) 08/05/2022 0745   CREATININE 1.27 (H) 08/05/2022 0745      Component Value Date/Time   CALCIUM 9.8 08/05/2022 0745   ALKPHOS 83 08/05/2022 0745   AST 13 (L) 08/05/2022 0745   ALT 11 08/05/2022 0745   BILITOT 0.4 08/05/2022 0745

## 2022-08-05 NOTE — Assessment & Plan Note (Signed)
Her pancytopenia has improved Observe closely She does not need transfusion support

## 2022-08-09 ENCOUNTER — Other Ambulatory Visit: Payer: Self-pay

## 2022-08-23 ENCOUNTER — Other Ambulatory Visit: Payer: Self-pay

## 2022-08-24 ENCOUNTER — Telehealth: Payer: Self-pay

## 2022-08-24 ENCOUNTER — Other Ambulatory Visit: Payer: Self-pay | Admitting: Hematology and Oncology

## 2022-08-24 ENCOUNTER — Encounter: Payer: Self-pay | Admitting: Hematology and Oncology

## 2022-08-24 ENCOUNTER — Other Ambulatory Visit (HOSPITAL_COMMUNITY): Payer: Self-pay

## 2022-08-24 ENCOUNTER — Other Ambulatory Visit (HOSPITAL_BASED_OUTPATIENT_CLINIC_OR_DEPARTMENT_OTHER): Payer: Self-pay

## 2022-08-24 DIAGNOSIS — U071 COVID-19: Secondary | ICD-10-CM | POA: Insufficient documentation

## 2022-08-24 MED ORDER — MOLNUPIRAVIR EUA 200MG CAPSULE
4.0000 | ORAL_CAPSULE | Freq: Two times a day (BID) | ORAL | 0 refills | Status: AC
  Filled 2022-08-24: qty 40, 5d supply, fill #0

## 2022-08-24 MED ORDER — NIRMATRELVIR/RITONAVIR (PAXLOVID)TABLET
3.0000 | ORAL_TABLET | Freq: Two times a day (BID) | ORAL | 0 refills | Status: DC
  Filled 2022-08-24: qty 30, 5d supply, fill #0

## 2022-08-24 NOTE — Telephone Encounter (Signed)
I sent that to [facility] They do not stock it in local pharmacy in general If her symptoms get worse, she might need to go to ER

## 2022-08-24 NOTE — Telephone Encounter (Signed)
Returned call to daughter. Daughter has covid and her Vyla tested positive today. Her symptoms are cough, being tired, chest congestion and sneezing. Daughter is asking for antiviral to [facility] outpatient pharmacy if possible.  Appts scheduled for 11/28 canceled and sent scheduling message to reschedule 21 days from today.

## 2022-08-24 NOTE — Telephone Encounter (Signed)
Called and given below message to daughter. She verbalized understanding. 

## 2022-08-25 ENCOUNTER — Other Ambulatory Visit: Payer: Self-pay

## 2022-08-30 ENCOUNTER — Ambulatory Visit: Payer: Medicare Other | Admitting: Hematology and Oncology

## 2022-08-30 ENCOUNTER — Other Ambulatory Visit: Payer: Medicare Other

## 2022-08-30 ENCOUNTER — Ambulatory Visit: Payer: Medicare Other

## 2022-08-31 ENCOUNTER — Other Ambulatory Visit: Payer: Self-pay

## 2022-09-07 ENCOUNTER — Encounter: Payer: Self-pay | Admitting: Licensed Clinical Social Worker

## 2022-09-07 ENCOUNTER — Telehealth: Payer: Self-pay | Admitting: Licensed Clinical Social Worker

## 2022-09-07 ENCOUNTER — Ambulatory Visit: Payer: Self-pay | Admitting: Licensed Clinical Social Worker

## 2022-09-07 DIAGNOSIS — Z1379 Encounter for other screening for genetic and chromosomal anomalies: Secondary | ICD-10-CM | POA: Insufficient documentation

## 2022-09-07 DIAGNOSIS — C5702 Malignant neoplasm of left fallopian tube: Secondary | ICD-10-CM

## 2022-09-07 DIAGNOSIS — Z23 Encounter for immunization: Secondary | ICD-10-CM | POA: Insufficient documentation

## 2022-09-07 NOTE — Telephone Encounter (Signed)
I contacted Ms. Danish to discuss her genetic testing results. No pathogenic variants were identified in the 84 genes analyzed. Detailed clinic note to follow. HRD testing through [facility] ordered and pending.   The test report has been scanned into EPIC and is located under the Molecular Pathology section of the Results Review tab.  A portion of the result report is included below for reference.      Faith Rogue, MS, [facility] Genetic Counselor [email or web address] Phone: [phone]

## 2022-09-07 NOTE — Progress Notes (Signed)
HPI:   Ms. Zertuche was previously seen in the [facility] due to a personal and family history of cancer and concerns regarding a hereditary predisposition to cancer. Please refer to our prior cancer genetics clinic note for more information regarding our discussion, assessment and recommendations, at the time. Ms. Inda recent genetic test results were disclosed to her, as were recommendations warranted by these results. These results and recommendations are discussed in more detail below.  CANCER HISTORY:  Oncology History Overview Note  HER2 negative, High grade serous   Fallopian tube cancer, carcinoma, left ([place])  03/29/2022 Imaging   MRI of the abdomen and pelvis reveals a left adnexal/ovarian mass measuring 5.5 x 2.8 x 3.7 cm with solid diffuse enhancement and T2 signal characteristics and intermediate to accentuated T1 signal characteristics.  No significant cystic component or ascites.  Renal lesion of the right kidney lower pole is thought to represent a benign lesion.  1.3 cm cystic lesion of the head of the pancreas is recommended to be follow-up with pancreatic MRI in 2 years.   05/02/2022 Tumor Marker   Patient's tumor was tested for the following markers: CA-125. Results of the tumor marker test revealed 69.   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 Fullness appreciated within the left cul de sac. On intra-abdominal entry, normal upper abdominal survey including liver edge, diaphragm, and stomach. Normal appearing omentum. No ascites. Normal appearing small bowel. Significant inflammation within the pelvis concerning initially for recent diverticulitis versus endometriosis. Thickened and inflamed pelvic peritoneum. Filmy adhesions between the  anterior cul de sac and uterus, between the right adnexa and and the sigmoid colon. Right adnexa inflammed but otherwise normal in appearing. Left fallopian tube dilated with evidence of hydrosalpinx. Friable adhesions of the fallopian tube to the sigmoid mesentery, left ovary and left broad ligament. No ascites. Uterus bulbous and 10cm with findings of endosalpingiosis versus endometriosis on much of the uterine serosa. Dense adhesions between the bladder and cervix. Cul de sac without evidence of disease. No ascites.  Cystoscopy findings: bladder dome intact, normal efflux from bilateral ureteral orifices. On frozen section, no abnormality of the endometrium. Anterior cul de sac adhesion with findings of hemosiderin, no definitive malignancy. Fallopian tube with malignancy.  Given findings and pelvic peritoneal involvement given fallopian tube adherent to surrounding structures, no upper abdominal findings, normal omentum, and normal neuroassessment intraoperatively as well as on preoperative imaging, lymphadenectomy deferred.   05/10/2022 Pathology Results   Procedure: Hysterectomy with bilateral salpingo-oophorectomy  Specimen Integrity: Intact  Tumor Site: Left fallopian tube  Tumor Size: 6.3 x 3.8 x 2.8 cm  Histologic Type: Serous carcinoma  Histologic Grade: High-grade  Ovarian Surface Involvement: Not identified  Fallopian Tube Surface Involvement: Not identified  Implants (required for advanced stage serous/seromucinous borderline  tumors only): Not applicable  Other Tissue/ Organ Involvement: Not applicable  Largest Extrapelvic Peritoneal Focus: Not applicable  Peritoneal/Ascitic Fluid Involvement: Not identified [facility] [phone])  Chemotherapy Response Score (CRS): Not applicable, no known presurgical  therapy  Regional Lymph Nodes: Not applicable (no lymph nodes submitted or found)  Distant Metastasis: Not applicable  Pathologic Stage Classification (pTNM, AJCC 8th Edition): pT1a, pN n/a   Ancillary Studies: Available upon request  Representative Tumor Block: B15  Comment(s): An immunohistochemistry stain for p53 is performed with  adequate control and is diffusely positive within the tumor.  The same  stain shows patchy variable staining within the reactive mesothelium  lining the serosal adhesions within the anterior cul-de-sac    05/17/2022 Initial Diagnosis   Fallopian tube cancer, carcinoma, left ([place])   05/30/2022 Cancer Staging   Staging form: Ovary, Fallopian Tube, and Primary Peritoneal Carcinoma, AJCC 8th Edition - Pathologic stage from 05/30/2022: FIGO Stage IIB (pT2b, pN0, cM0) - Signed by Heath Lark, MD on 05/30/2022 Stage prefix: Initial diagnosis   06/10/2022 -  Chemotherapy   Patient is on Treatment Plan : OVARIAN Carboplatin (AUC 6) + Paclitaxel (175) q21d X 6 Cycles     06/10/2022 Procedure   Placement of a subcutaneous power-injectable port device. Catheter tip at the superior cavoatrial junction.      Genetic Testing   Negative genetic testing. No pathogenic variants identified on the [facility] Multi-Cancer+RNA panel. The report date is 09/06/2022.   The Multi-Cancer + RNA Panel offered by [facility] includes sequencing and/or deletion/duplication analysis of the following 70 genes:  AIP*, ALK, APC*, ATM*, AXIN2*, BAP1*, BARD1*, BLM*, BMPR1A*, BRCA1*, BRCA2*, BRIP1*, CDC73*, CDH1*, CDK4, CDKN1B*, CDKN2A, CHEK2*, CTNNA1*, DICER1*, EPCAM, EGFR, FH*, FLCN*, GREM1, HOXB13, KIT, LZTR1, MAX*, MBD4, MEN1*, MET, MITF, MLH1*, MSH2*, MSH3*, MSH6*, MUTYH*, NF1*, NF2*, NTHL1*, PALB2*, PDGFRA, PMS2*, POLD1*, POLE*, POT1*, PRKAR1A*, PTCH1*, PTEN*, RAD51C*, RAD51D*, RB1*, RET, SDHA*, SDHAF2*, SDHB*, SDHC*, SDHD*, SMAD4*, SMARCA4*, SMARCB1*, SMARCE1*, STK11*, SUFU*, TMEM127*, TP53*, TSC1*, TSC2*, VHL*. RNA analysis is performed for * genes.     FAMILY HISTORY:  We obtained a detailed, 4-generation family history.  Significant diagnoses are listed below: Family History  Problem  Relation Age of Onset   Heart disease Maternal Grandmother    Heart disease Maternal Grandfather    Stroke Maternal Grandfather    Colon cancer Maternal Grandfather        dx 17s   Breast cancer Other    Cancer Half-Brother        in spine   Ovarian cancer Neg Hx    Endometrial cancer Neg Hx    Pancreatic cancer Neg Hx    Prostate cancer Neg Hx     Ms. Gierke has 1 daughter, [name], and 2 sons. She has 1 full brother, 1 maternal half brother. Her half brother has a tumor on his spine that he is being treated with chemotherapy for.    Ms. Sauseda mother passed at 38. Maternal grandfather had colon cancer in his 67s. Maternal great aunt had breast cancer.   Ms. Gangi father passed at 19 during war. Patient has no other information about his side of the family.    Ms. Usman is unaware of previous family history of genetic testing for hereditary cancer risks. There is no reported Ashkenazi Jewish ancestry. There is no known consanguinity.     GENETIC TEST RESULTS:  The [facility] Multi-Cancer+RNA Panel found no pathogenic mutations.   The Multi-Cancer + RNA Panel offered by [facility] includes sequencing and/or deletion/duplication analysis of the following 70 genes:  AIP*, ALK, APC*, ATM*, AXIN2*, BAP1*, BARD1*, BLM*, BMPR1A*, BRCA1*, BRCA2*, BRIP1*, CDC73*, CDH1*, CDK4, CDKN1B*, CDKN2A, CHEK2*, CTNNA1*, DICER1*, EPCAM, EGFR, FH*, FLCN*, GREM1, HOXB13, KIT, LZTR1, MAX*, MBD4, MEN1*, MET, MITF, MLH1*, MSH2*, MSH3*, MSH6*, MUTYH*, NF1*, NF2*, NTHL1*, PALB2*, PDGFRA, PMS2*, POLD1*, POLE*, POT1*, PRKAR1A*, PTCH1*, PTEN*, RAD51C*, RAD51D*, RB1*, RET, SDHA*, SDHAF2*, SDHB*, SDHC*, SDHD*, SMAD4*, SMARCA4*, SMARCB1*, SMARCE1*, STK11*, SUFU*, TMEM127*, TP53*, TSC1*, TSC2*, VHL*. RNA analysis is performed for * genes.   The test report  has been scanned into EPIC and is located under the Molecular Pathology section of the Results Review tab.  A portion of the result report is included below for reference.  Genetic testing reported out on 09/06/2022.     Even though a pathogenic variant was not identified, possible explanations for the cancer in the family may include: There may be no hereditary risk for cancer in the family. The cancers in Ms. Recine and/or her family may be sporadic/familial or due to other genetic and environmental factors. There may be a gene mutation in one of these genes that current testing methods cannot detect but that chance is small. There could be another gene that has not yet been discovered, or that we have not yet tested, that is responsible for the cancer diagnoses in the family.  It is also possible there is a hereditary cause for the cancer in the family that Ms. Hinde did not inherit.  Therefore, it is important to remain in touch with cancer genetics in the future so that we can continue to offer Ms. Sedgwick the most up to date genetic testing.   ADDITIONAL GENETIC TESTING:  We discussed with Ms. Oddo that her genetic testing was fairly extensive.  If there are additional relevant genes identified to increase cancer risk that can be analyzed in the future, we would be happy to discuss and coordinate this testing at that time.    CANCER SCREENING RECOMMENDATIONS:  Ms. Brickhouse test result is considered negative (normal).  This means that we have not identified a hereditary cause for her personal and family history of cancer at this time.   An individual's cancer risk and medical management are not determined by genetic test results alone. Overall cancer risk assessment incorporates additional factors, including personal medical history, family history, and any available genetic information that may result in a personalized plan for cancer prevention and surveillance. Therefore, it is recommended she continue to follow the cancer management and screening guidelines provided by her oncology and primary healthcare provider.  RECOMMENDATIONS FOR FAMILY MEMBERS:   Since  she did not inherit a identifiable mutation in a cancer predisposition gene included on this panel, her children could not have inherited a known mutation from her in one of these genes. Individuals in this family might be at some increased risk of developing cancer, over the general population risk, due to the family history of cancer.  Individuals in the family should notify their providers of the family history of cancer. We recommend women in this family have a yearly mammogram beginning at age 33, or 39 years younger than the earliest onset of cancer, an annual clinical breast exam, and perform monthly breast self-exams.  Family members should have colonoscopies by at age 63, or earlier, as recommended by their providers. FOLLOW-UP:  Lastly, we discussed with Ms. Fuquay that cancer genetics is a rapidly advancing field and it is possible that new genetic tests will be appropriate for her and/or her family members in the future. We encouraged her to remain in contact with cancer genetics on an annual basis so we can update her personal and family histories and let her know of advances in cancer genetics that may benefit this family.   Our contact number was provided. Ms. Wiens questions were answered to her satisfaction, and she knows she is welcome to call us at anytime with additional questions or concerns.    Faith Rogue, MS, [facility] Genetic Counselor [email or web address] Phone: [phone]

## 2022-09-08 ENCOUNTER — Telehealth: Payer: Self-pay | Admitting: Hematology and Oncology

## 2022-09-08 NOTE — Telephone Encounter (Signed)
Called patient and spoke w/daughter. Patient notified.

## 2022-09-09 ENCOUNTER — Other Ambulatory Visit: Payer: Self-pay

## 2022-09-11 ENCOUNTER — Other Ambulatory Visit: Payer: Self-pay

## 2022-09-14 MED FILL — Dexamethasone Sodium Phosphate Inj 100 MG/10ML: INTRAMUSCULAR | Qty: 1 | Status: AC

## 2022-09-14 MED FILL — Fosaprepitant Dimeglumine For IV Infusion 150 MG (Base Eq): INTRAVENOUS | Qty: 5 | Status: AC

## 2022-09-15 ENCOUNTER — Inpatient Hospital Stay: Payer: Medicare Other

## 2022-09-15 ENCOUNTER — Inpatient Hospital Stay: Payer: Medicare Other | Admitting: Hematology and Oncology

## 2022-09-15 ENCOUNTER — Encounter: Payer: Self-pay | Admitting: Hematology and Oncology

## 2022-09-15 ENCOUNTER — Inpatient Hospital Stay: Payer: Medicare Other | Attending: Gynecologic Oncology

## 2022-09-15 VITALS — BP 143/56 | HR 82 | Temp 98.8°F | Resp 18 | Ht 63.0 in | Wt 223.8 lb

## 2022-09-15 DIAGNOSIS — N183 Chronic kidney disease, stage 3 unspecified: Secondary | ICD-10-CM

## 2022-09-15 DIAGNOSIS — D61818 Other pancytopenia: Secondary | ICD-10-CM | POA: Diagnosis not present

## 2022-09-15 DIAGNOSIS — Z5111 Encounter for antineoplastic chemotherapy: Secondary | ICD-10-CM | POA: Diagnosis present

## 2022-09-15 DIAGNOSIS — C5702 Malignant neoplasm of left fallopian tube: Secondary | ICD-10-CM

## 2022-09-15 DIAGNOSIS — Z79899 Other long term (current) drug therapy: Secondary | ICD-10-CM | POA: Diagnosis not present

## 2022-09-15 LAB — CBC WITH DIFFERENTIAL (CANCER CENTER ONLY)
Abs Immature Granulocytes: 0.14 10*3/uL — ABNORMAL HIGH (ref 0.00–0.07)
Basophils Absolute: 0 10*3/uL (ref 0.0–0.1)
Basophils Relative: 0 %
Eosinophils Absolute: 0 10*3/uL (ref 0.0–0.5)
Eosinophils Relative: 0 %
HCT: 29.9 % — ABNORMAL LOW (ref 36.0–46.0)
Hemoglobin: 10 g/dL — ABNORMAL LOW (ref 12.0–15.0)
Immature Granulocytes: 2 %
Lymphocytes Relative: 11 %
Lymphs Abs: 1 10*3/uL (ref 0.7–4.0)
MCH: 34.5 pg — ABNORMAL HIGH (ref 26.0–34.0)
MCHC: 33.4 g/dL (ref 30.0–36.0)
MCV: 103.1 fL — ABNORMAL HIGH (ref 80.0–100.0)
Monocytes Absolute: 0.1 10*3/uL (ref 0.1–1.0)
Monocytes Relative: 1 %
Neutro Abs: 8 10*3/uL — ABNORMAL HIGH (ref 1.7–7.7)
Neutrophils Relative %: 86 %
Platelet Count: 170 10*3/uL (ref 150–400)
RBC: 2.9 MIL/uL — ABNORMAL LOW (ref 3.87–5.11)
RDW: 17.7 % — ABNORMAL HIGH (ref 11.5–15.5)
WBC Count: 9.2 10*3/uL (ref 4.0–10.5)
nRBC: 0 % (ref 0.0–0.2)

## 2022-09-15 LAB — CMP (CANCER CENTER ONLY)
ALT: 9 U/L (ref 0–44)
AST: 14 U/L — ABNORMAL LOW (ref 15–41)
Albumin: 4 g/dL (ref 3.5–5.0)
Alkaline Phosphatase: 74 U/L (ref 38–126)
Anion gap: 11 (ref 5–15)
BUN: 50 mg/dL — ABNORMAL HIGH (ref 8–23)
CO2: 25 mmol/L (ref 22–32)
Calcium: 10.1 mg/dL (ref 8.9–10.3)
Chloride: 104 mmol/L (ref 98–111)
Creatinine: 1.43 mg/dL — ABNORMAL HIGH (ref 0.44–1.00)
GFR, Estimated: 37 mL/min — ABNORMAL LOW (ref >60)
Glucose, Bld: 216 mg/dL — ABNORMAL HIGH (ref 70–99)
Potassium: 4.6 mmol/L (ref 3.5–5.1)
Sodium: 140 mmol/L (ref 135–145)
Total Bilirubin: 0.5 mg/dL (ref 0.3–1.2)
Total Protein: 6.5 g/dL (ref 6.5–8.1)

## 2022-09-15 MED ORDER — SODIUM CHLORIDE 0.9 % IV SOLN
368.0000 mg | Freq: Once | INTRAVENOUS | Status: AC
  Administered 2022-09-15: 370 mg via INTRAVENOUS
  Filled 2022-09-15: qty 37

## 2022-09-15 MED ORDER — HEPARIN SOD (PORK) LOCK FLUSH 100 UNIT/ML IV SOLN
500.0000 [IU] | Freq: Once | INTRAVENOUS | Status: AC | PRN
  Administered 2022-09-15: 500 [IU]

## 2022-09-15 MED ORDER — CETIRIZINE HCL 10 MG/ML IV SOLN
10.0000 mg | Freq: Once | INTRAVENOUS | Status: AC
  Administered 2022-09-15: 10 mg via INTRAVENOUS
  Filled 2022-09-15: qty 1

## 2022-09-15 MED ORDER — SODIUM CHLORIDE 0.9% FLUSH
10.0000 mL | Freq: Once | INTRAVENOUS | Status: AC
  Administered 2022-09-15: 10 mL

## 2022-09-15 MED ORDER — PALONOSETRON HCL INJECTION 0.25 MG/5ML
0.2500 mg | Freq: Once | INTRAVENOUS | Status: AC
  Administered 2022-09-15: 0.25 mg via INTRAVENOUS
  Filled 2022-09-15: qty 5

## 2022-09-15 MED ORDER — SODIUM CHLORIDE 0.9 % IV SOLN: Freq: Once | INTRAVENOUS | Status: AC

## 2022-09-15 MED ORDER — SODIUM CHLORIDE 0.9 % IV SOLN
87.5000 mg/m2 | Freq: Once | INTRAVENOUS | Status: AC
  Administered 2022-09-15: 186 mg via INTRAVENOUS
  Filled 2022-09-15: qty 31

## 2022-09-15 MED ORDER — SODIUM CHLORIDE 0.9 % IV SOLN
150.0000 mg | Freq: Once | INTRAVENOUS | Status: AC
  Administered 2022-09-15: 150 mg via INTRAVENOUS
  Filled 2022-09-15: qty 150

## 2022-09-15 MED ORDER — SODIUM CHLORIDE 0.9 % IV SOLN
10.0000 mg | Freq: Once | INTRAVENOUS | Status: AC
  Administered 2022-09-15: 10 mg via INTRAVENOUS
  Filled 2022-09-15: qty 10

## 2022-09-15 MED ORDER — FAMOTIDINE IN NACL 20-0.9 MG/50ML-% IV SOLN
20.0000 mg | Freq: Once | INTRAVENOUS | Status: AC
  Administered 2022-09-15: 20 mg via INTRAVENOUS
  Filled 2022-09-15: qty 50

## 2022-09-15 MED ORDER — SODIUM CHLORIDE 0.9% FLUSH
10.0000 mL | INTRAVENOUS | Status: DC | PRN
  Administered 2022-09-15: 10 mL

## 2022-09-15 NOTE — Assessment & Plan Note (Signed)
She has persistent anemia, improved compared to last cycle She tolerated reduced dose of paclitaxel well We will continue similar dosing

## 2022-09-15 NOTE — Patient Instructions (Signed)
[facility] MEDICAL [facility]COLOGY  Discharge Instructions: Thank you for choosing [facility] to provide your oncology and hematology care.   If you have a lab appointment with the Cancer Center, please go directly to the Cancer Center and check in at the registration area.   Wear comfortable clothing and clothing appropriate for easy access to any Portacath or PICC line.   We strive to give you quality time with your provider. You may need to reschedule your appointment if you arrive late (15 or more minutes).  Arriving late affects you and other patients whose appointments are after yours.  Also, if you miss three or more appointments without notifying the office, you may be dismissed from the clinic at the provider's discretion.      For prescription refill requests, have your pharmacy contact our office and allow 72 hours for refills to be completed.    Today you received the following chemotherapy and/or immunotherapy agents: paclitaxel and carboplatin      To help prevent nausea and vomiting after your treatment, we encourage you to take your nausea medication as directed.  BELOW ARE SYMPTOMS THAT SHOULD BE REPORTED IMMEDIATELY: *FEVER GREATER THAN 100.4 F (38 C) OR HIGHER *CHILLS OR SWEATING *NAUSEA AND VOMITING THAT IS NOT CONTROLLED WITH YOUR NAUSEA MEDICATION *UNUSUAL SHORTNESS OF BREATH *UNUSUAL BRUISING OR BLEEDING *URINARY PROBLEMS (pain or burning when urinating, or frequent urination) *BOWEL PROBLEMS (unusual diarrhea, constipation, pain near the anus) TENDERNESS IN MOUTH AND THROAT WITH OR WITHOUT PRESENCE OF ULCERS (sore throat, sores in mouth, or a toothache) UNUSUAL RASH, SWELLING OR PAIN  UNUSUAL VAGINAL DISCHARGE OR ITCHING   Items with * indicate a potential emergency and should be followed up as soon as possible or go to the Emergency Department if any problems should occur.  Please show the CHEMOTHERAPY ALERT CARD or IMMUNOTHERAPY ALERT  CARD at check-in to the Emergency Department and triage nurse.  Should you have questions after your visit or need to cancel or reschedule your appointment, please contact [facility]  Dept: [phone]  and follow the prompts.  Office hours are 8:00 a.m. to 4:30 p.m. Monday - Friday. Please note that voicemails left after 4:00 p.m. may not be returned until the following business day.  We are closed weekends and major holidays. You have access to a nurse at all times for urgent questions. Please call the main number to the clinic Dept: [phone] and follow the prompts.   For any non-urgent questions, you may also contact your provider using MyChart. We now offer e-Visits for anyone 18 and older to request care online for non-urgent symptoms. For details visit [email or web address].   Also download the MyChart app! Go to the app store, search "MyChart", open the app, select , and log in with your MyChart username and password.  Masks are optional in the cancer centers. If you would like for your care team to wear a mask while they are taking care of you, please let them know. You may have one support Sarah Bishop who is at least 82 years old accompany you for your appointments. 

## 2022-09-15 NOTE — Assessment & Plan Note (Signed)
Since recent dose adjustment, her blood counts has improved We will continue similar dose as before 

## 2022-09-15 NOTE — Assessment & Plan Note (Signed)
We discussed risk factors for worsening kidney failure We discussed importance of adequate hydration and will adjust dose of chemo accordingly

## 2022-09-15 NOTE — Progress Notes (Signed)
[place] OFFICE PROGRESS NOTE  Patient Care Team: Margretta Sidle, MD as PCP - Cristino Martes, MD (Inactive) as Consulting Physician (Dermatology) Lavonna Monarch, MD (Inactive) as Consulting Physician (Dermatology)  ASSESSMENT & PLAN:  Fallopian tube cancer, carcinoma, left [facility]) She has persistent anemia, improved compared to last cycle She tolerated reduced dose of paclitaxel well We will continue similar dosing  CKD (chronic kidney disease), stage III ([place]) We discussed risk factors for worsening kidney failure We discussed importance of adequate hydration and will adjust dose of chemo accordingly  Pancytopenia, acquired ([place]) Since recent dose adjustment, her blood counts has improved We will continue similar dose as before  No orders of the defined types were placed in this encounter.   All questions were answered. The patient knows to call the clinic with any problems, questions or concerns. The total time spent in the appointment was 20 minutes encounter with patients including review of chart and various tests results, discussions about plan of care and coordination of care plan   Heath Lark, MD 09/15/2022 9:52 AM  INTERVAL HISTORY: Please see below for problem oriented charting. she returns for treatment follow-up with her family She tolerated last cycle of therapy well She has fully recovered from COVID infection Denies worsening peripheral neuropathy  REVIEW OF SYSTEMS:   Constitutional: Denies fevers, chills or abnormal weight loss Eyes: Denies blurriness of vision Ears, nose, mouth, throat, and face: Denies mucositis or sore throat Respiratory: Denies cough, dyspnea or wheezes Cardiovascular: Denies palpitation, chest discomfort or lower extremity swelling Gastrointestinal:  Denies nausea, heartburn or change in bowel habits Skin: Denies abnormal skin rashes Lymphatics: Denies new lymphadenopathy or easy bruising Neurological:Denies  numbness, tingling or new weaknesses Behavioral/Psych: Mood is stable, no new changes  All other systems were reviewed with the patient and are negative.  I have reviewed the past medical history, past surgical history, social history and family history with the patient and they are unchanged from previous note.  ALLERGIES:  is allergic to augmentin [amoxicillin-pot clavulanate] and iodinated contrast media.  MEDICATIONS:  Current Outpatient Medications  Medication Sig Dispense Refill   acetaminophen (TYLENOL) 500 MG tablet Take 500-1,000 mg by mouth every 6 (six) hours as needed (pain.).     allopurinol (ZYLOPRIM) 300 MG tablet Take 300 mg by mouth at bedtime.     amLODipine (NORVASC) 5 MG tablet Take 5 mg by mouth at bedtime.     atenolol (TENORMIN) 50 MG tablet Take 100 mg by mouth at bedtime.     atorvastatin (LIPITOR) 80 MG tablet Take 80 mg by mouth every other day. At night     cholecalciferol (VITAMIN D3) 25 MCG (1000 UNIT) tablet Take 2,000 Units by mouth at bedtime.     citalopram (CELEXA) 10 MG tablet Take 10 mg by mouth in the morning.     dexamethasone (DECADRON) 4 MG tablet Take 2 tabs at the night before and 2 tab the morning of chemotherapy, every 3 weeks, by mouth x 6 cycles (Patient not taking: Reported on 05/31/2022) 36 tablet 6   divalproex (DEPAKOTE) 250 MG DR tablet Take 750 mg by mouth in the morning.     furosemide (LASIX) 20 MG tablet Take 40 mg by mouth in the morning.     glipiZIDE (GLUCOTROL) 5 MG tablet Take 5 mg by mouth in the morning.     Lancets (ONETOUCH DELICA PLUS [id number]) MISC Apply topically daily.     levothyroxine (SYNTHROID) 50 MCG tablet Take 50  mcg by mouth daily before breakfast.     lidocaine-prilocaine (EMLA) cream Apply to affected area once 30 g 3   miconazole (BAZA ANTIFUNGAL) 2 % cream Apply 1 Application topically 2 (two) times daily as needed (skin irritation.).     ondansetron (ZOFRAN) 8 MG tablet Take 1 tablet (8 mg total) by mouth  every 8 (eight) hours as needed for nausea or vomiting. Start on the third day after chemotherapy. (Patient not taking: Reported on 05/31/2022) 30 tablet 1   prochlorperazine (COMPAZINE) 10 MG tablet Take 1 tablet (10 mg total) by mouth every 6 (six) hours as needed for nausea or vomiting. (Patient not taking: Reported on 05/31/2022) 30 tablet 1   No current facility-administered medications for this visit.   Facility-Administered Medications Ordered in Other Visits  Medication Dose Route Frequency Provider Last Rate Last Admin   0.9 %  sodium chloride infusion   Intravenous Once Alvy Bimler, Trevaris Pennella, MD       CARBOplatin (PARAPLATIN) 370 mg in sodium chloride 0.9 % 100 mL chemo infusion  370 mg Intravenous Once Alvy Bimler, Stanislawa Gaffin, MD       cetirizine (QUZYTTIR) injection 10 mg  10 mg Intravenous Once Alvy Bimler, Zineb Glade, MD       dexamethasone (DECADRON) 10 mg in sodium chloride 0.9 % 50 mL IVPB  10 mg Intravenous Once Alvy Bimler, Milcah Dulany, MD       famotidine (PEPCID) IVPB 20 mg premix  20 mg Intravenous Once Alvy Bimler, Ronette Hank, MD       fosaprepitant (EMEND) 150 mg in sodium chloride 0.9 % 145 mL IVPB  150 mg Intravenous Once Alvy Bimler, Ceaser Ebeling, MD       heparin lock flush 100 unit/mL  500 Units Intracatheter Once PRN Alvy Bimler, Muneer Leider, MD       PACLitaxel (TAXOL) 186 mg in sodium chloride 0.9 % 250 mL chemo infusion (> 47m/m2)  87.5 mg/m2 (Treatment Plan Recorded) Intravenous Once GAlvy Bimler Shawntina Diffee, MD       palonosetron (ALOXI) injection 0.25 mg  0.25 mg Intravenous Once GAlvy Bimler Polina Burmaster, MD       sodium chloride flush (NS) 0.9 % injection 10 mL  10 mL Intracatheter PRN GHeath Lark MD        SUMMARY OF ONCOLOGIC HISTORY: Oncology History Overview Note  HER2 negative, High grade serous Neg germline genetic testing   Fall[place]  03/29/2022 Imaging   MRI of the abdomen and pelvis reveals a left adnexal/ovarian mass measuring 5.5 x 2.8 x 3.7 cm with solid diffuse enhancement and T2 signal characteristics and  intermediate to accentuated T1 signal characteristics.  No significant cystic component or ascites.  Renal lesion of the right kidney lower pole is thought to represent a benign lesion.  1.3 cm cystic lesion of the head of the pancreas is recommended to be follow-up with pancreatic MRI in 2 years.   05/02/2022 Tumor Marker   Patient's tumor was tested for the following markers: CA-125. Results of the tumor marker test revealed 69.   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 Fullness appreciated within the left cul de sac. On intra-abdominal entry, normal upper abdominal survey including liver edge, diaphragm, and stomach. Normal appearing omentum. No ascites. Normal appearing small bowel. Significant inflammation within the pelvis concerning initially for recent diverticulitis versus endometriosis. Thickened and inflamed pelvic peritoneum.  Filmy adhesions between the anterior cul de sac and uterus, between the right adnexa and and the sigmoid colon. Right adnexa inflammed but otherwise normal in appearing. Left fallopian tube dilated with evidence of hydrosalpinx. Friable adhesions of the fallopian tube to the sigmoid mesentery, left ovary and left broad ligament. No ascites. Uterus bulbous and 10cm with findings of endosalpingiosis versus endometriosis on much of the uterine serosa. Dense adhesions between the bladder and cervix. Cul de sac without evidence of disease. No ascites.  Cystoscopy findings: bladder dome intact, normal efflux from bilateral ureteral orifices. On frozen section, no abnormality of the endometrium. Anterior cul de sac adhesion with findings of hemosiderin, no definitive malignancy. Fallopian tube with malignancy.  Given findings and pelvic peritoneal involvement  given fallopian tube adherent to surrounding structures, no upper abdominal findings, normal omentum, and normal neuroassessment intraoperatively as well as on preoperative imaging, lymphadenectomy deferred.   05/10/2022 Pathology Results   Procedure: Hysterectomy with bilateral salpingo-oophorectomy  Specimen Integrity: Intact  Tumor Site: Left fallopian tube  Tumor Size: 6.3 x 3.8 x 2.8 cm  Histologic Type: Serous carcinoma  Histologic Grade: High-grade  Ovarian Surface Involvement: Not identified  Fallopian Tube Surface Involvement: Not identified  Implants (required for advanced stage serous/seromucinous borderline  tumors only): Not applicable  Other Tissue/ Organ Involvement: Not applicable  Largest Extrapelvic Peritoneal Focus: Not applicable  Peritoneal/Ascitic Fluid Involvement: Not identified [facility] [phone])  Chemotherapy Response Score (CRS): Not applicable, no known presurgical  therapy  Regional Lymph Nodes: Not applicable (no lymph nodes submitted or found)  Distant Metastasis: Not applicable  Pathologic Stage Classification (pTNM, AJCC 8th Edition): pT1a, pN n/a  Ancillary Studies: Available upon request  Representative Tumor Block: B15  Comment(s): An immunohistochemistry stain for p53 is performed with  adequate control and is diffusely positive within the tumor.  The same  stain shows patchy variable staining within the reactive mesothelium  lining the serosal adhesions within the anterior cul-de-sac    05/17/2022 Initial Diagnosis   Fallopian tube cancer, carcinoma, left ([place])   05/30/2022 Cancer Staging   Staging form: Ovary, Fallopian Tube, and Primary Peritoneal Carcinoma, AJCC 8th Edition - Pathologic stage from 05/30/2022: FIGO Stage IIB (pT2b, pN0, cM0) - Signed by Heath Lark, MD on 05/30/2022 Stage prefix: Initial diagnosis   06/10/2022 -  Chemotherapy   Patient is on Treatment Plan : OVARIAN Carboplatin (AUC 6) + Paclitaxel (175) q21d X 6 Cycles     06/10/2022  Procedure   Placement of a subcutaneous power-injectable port device. Catheter tip at the superior cavoatrial junction.      Genetic Testing   Negative genetic testing. No pathogenic variants identified on the [facility] Multi-Cancer+RNA panel. The report date is 09/06/2022.   The Multi-Cancer + RNA Panel offered by [facility] includes sequencing and/or deletion/duplication analysis of the following 70 genes:  AIP*, ALK, APC*, ATM*, AXIN2*, BAP1*, BARD1*, BLM*, BMPR1A*, BRCA1*, BRCA2*, BRIP1*, CDC73*, CDH1*, CDK4, CDKN1B*, CDKN2A, CHEK2*, CTNNA1*, DICER1*, EPCAM, EGFR, FH*, FLCN*, GREM1, HOXB13, KIT, LZTR1, MAX*, MBD4, MEN1*, MET, MITF, MLH1*, MSH2*, MSH3*, MSH6*, MUTYH*, NF1*, NF2*, NTHL1*, PALB2*, PDGFRA, PMS2*, POLD1*, POLE*, POT1*, PRKAR1A*, PTCH1*, PTEN*, RAD51C*, RAD51D*, RB1*, RET, SDHA*, SDHAF2*, SDHB*, SDHC*, SDHD*, SMAD4*, SMARCA4*, SMARCB1*, SMARCE1*, STK11*, SUFU*, TMEM127*, TP53*, TSC1*, TSC2*, VHL*. RNA analysis is performed for * genes.     PHYSICAL EXAMINATION: ECOG PERFORMANCE STATUS: 1 - Symptomatic but completely ambulatory  Vitals:   09/15/22 0914  BP: (!) 143/56  Pulse: 82  Resp: 18  Temp: 98.8 F (37.1 C)  SpO2: 96%   Filed Weights   09/15/22 0914  Weight: 223 lb 12.8 oz (101.5 kg)    GENERAL:alert, no distress and comfortable NEURO: alert & oriented x 3 with fluent speech, no focal motor/sensory deficits  LABORATORY DATA:  I have reviewed the data as listed    Component Value Date/Time   NA 140 09/15/2022 0853   K 4.6 09/15/2022 0853   CL 104 09/15/2022 0853   CO2 25 09/15/2022 0853   GLUCOSE 216 (H) 09/15/2022 0853   BUN 50 (H) 09/15/2022 0853   CREATININE 1.43 (H) 09/15/2022 0853   CALCIUM 10.1 09/15/2022 0853   PROT 6.5 09/15/2022 0853   ALBUMIN 4.0 09/15/2022 0853   AST 14 (L) 09/15/2022 0853   ALT 9 09/15/2022 0853   ALKPHOS 74 09/15/2022 0853   BILITOT 0.5 09/15/2022 0853   GFRNONAA 37 (L) 09/15/2022 0853   GFRAA 57 (L) 12/25/2012 0850    No  results found for: "SPEP", "UPEP"  Lab Results  Component Value Date   WBC 9.2 09/15/2022   NEUTROABS 8.0 (H) 09/15/2022   HGB 10.0 (L) 09/15/2022   HCT 29.9 (L) 09/15/2022   MCV 103.1 (H) 09/15/2022   PLT 170 09/15/2022      Chemistry      Component Value Date/Time   NA 140 09/15/2022 0853   K 4.6 09/15/2022 0853   CL 104 09/15/2022 0853   CO2 25 09/15/2022 0853   BUN 50 (H) 09/15/2022 0853   CREATININE 1.43 (H) 09/15/2022 0853      Component Value Date/Time   CALCIUM 10.1 09/15/2022 0853   ALKPHOS 74 09/15/2022 0853   AST 14 (L) 09/15/2022 0853   ALT 9 09/15/2022 0853   BILITOT 0.5 09/15/2022 9201

## 2022-09-21 ENCOUNTER — Encounter: Payer: Self-pay | Admitting: Licensed Clinical Social Worker

## 2022-09-27 ENCOUNTER — Encounter (HOSPITAL_COMMUNITY): Payer: Self-pay | Admitting: Gynecologic Oncology

## 2022-10-13 MED FILL — Fosaprepitant Dimeglumine For IV Infusion 150 MG (Base Eq): INTRAVENOUS | Qty: 5 | Status: AC

## 2022-10-13 MED FILL — Dexamethasone Sodium Phosphate Inj 100 MG/10ML: INTRAMUSCULAR | Qty: 1 | Status: AC

## 2022-10-14 ENCOUNTER — Encounter: Payer: Self-pay | Admitting: Hematology and Oncology

## 2022-10-14 ENCOUNTER — Inpatient Hospital Stay: Payer: Medicare Other

## 2022-10-14 ENCOUNTER — Other Ambulatory Visit: Payer: Self-pay

## 2022-10-14 ENCOUNTER — Inpatient Hospital Stay: Payer: Medicare Other | Attending: Gynecologic Oncology | Admitting: Hematology and Oncology

## 2022-10-14 VITALS — BP 148/40 | HR 74 | Temp 97.9°F | Resp 18 | Ht 63.0 in | Wt 222.2 lb

## 2022-10-14 DIAGNOSIS — E1142 Type 2 diabetes mellitus with diabetic polyneuropathy: Secondary | ICD-10-CM

## 2022-10-14 DIAGNOSIS — Z5111 Encounter for antineoplastic chemotherapy: Secondary | ICD-10-CM | POA: Insufficient documentation

## 2022-10-14 DIAGNOSIS — D61818 Other pancytopenia: Secondary | ICD-10-CM | POA: Diagnosis not present

## 2022-10-14 DIAGNOSIS — C5702 Malignant neoplasm of left fallopian tube: Secondary | ICD-10-CM | POA: Diagnosis not present

## 2022-10-14 LAB — CBC WITH DIFFERENTIAL (CANCER CENTER ONLY)
Abs Immature Granulocytes: 0.12 10*3/uL — ABNORMAL HIGH (ref 0.00–0.07)
Basophils Absolute: 0 10*3/uL (ref 0.0–0.1)
Basophils Relative: 0 %
Eosinophils Absolute: 0.2 10*3/uL (ref 0.0–0.5)
Eosinophils Relative: 2 %
HCT: 30.8 % — ABNORMAL LOW (ref 36.0–46.0)
Hemoglobin: 10.5 g/dL — ABNORMAL LOW (ref 12.0–15.0)
Immature Granulocytes: 1 %
Lymphocytes Relative: 14 %
Lymphs Abs: 1.4 10*3/uL (ref 0.7–4.0)
MCH: 35.2 pg — ABNORMAL HIGH (ref 26.0–34.0)
MCHC: 34.1 g/dL (ref 30.0–36.0)
MCV: 103.4 fL — ABNORMAL HIGH (ref 80.0–100.0)
Monocytes Absolute: 0.1 10*3/uL (ref 0.1–1.0)
Monocytes Relative: 1 %
Neutro Abs: 8 10*3/uL — ABNORMAL HIGH (ref 1.7–7.7)
Neutrophils Relative %: 82 %
Platelet Count: 199 10*3/uL (ref 150–400)
RBC: 2.98 MIL/uL — ABNORMAL LOW (ref 3.87–5.11)
RDW: 17 % — ABNORMAL HIGH (ref 11.5–15.5)
WBC Count: 9.8 10*3/uL (ref 4.0–10.5)
nRBC: 0 % (ref 0.0–0.2)

## 2022-10-14 LAB — CMP (CANCER CENTER ONLY)
ALT: 10 U/L (ref 0–44)
AST: 14 U/L — ABNORMAL LOW (ref 15–41)
Albumin: 4.1 g/dL (ref 3.5–5.0)
Alkaline Phosphatase: 84 U/L (ref 38–126)
Anion gap: 14 (ref 5–15)
BUN: 54 mg/dL — ABNORMAL HIGH (ref 8–23)
CO2: 24 mmol/L (ref 22–32)
Calcium: 10.2 mg/dL (ref 8.9–10.3)
Chloride: 102 mmol/L (ref 98–111)
Creatinine: 1.47 mg/dL — ABNORMAL HIGH (ref 0.44–1.00)
GFR, Estimated: 35 mL/min — ABNORMAL LOW (ref >60)
Glucose, Bld: 127 mg/dL — ABNORMAL HIGH (ref 70–99)
Potassium: 4.5 mmol/L (ref 3.5–5.1)
Sodium: 140 mmol/L (ref 135–145)
Total Bilirubin: 0.4 mg/dL (ref 0.3–1.2)
Total Protein: 7.1 g/dL (ref 6.5–8.1)

## 2022-10-14 MED ORDER — SODIUM CHLORIDE 0.9 % IV SOLN: Freq: Once | INTRAVENOUS | Status: AC

## 2022-10-14 MED ORDER — SODIUM CHLORIDE 0.9 % IV SOLN
87.5000 mg/m2 | Freq: Once | INTRAVENOUS | Status: AC
  Administered 2022-10-14: 186 mg via INTRAVENOUS
  Filled 2022-10-14: qty 31

## 2022-10-14 MED ORDER — HEPARIN SOD (PORK) LOCK FLUSH 100 UNIT/ML IV SOLN
500.0000 [IU] | Freq: Once | INTRAVENOUS | Status: AC | PRN
  Administered 2022-10-14: 500 [IU]

## 2022-10-14 MED ORDER — SODIUM CHLORIDE 0.9 % IV SOLN
150.0000 mg | Freq: Once | INTRAVENOUS | Status: AC
  Administered 2022-10-14: 150 mg via INTRAVENOUS
  Filled 2022-10-14: qty 150

## 2022-10-14 MED ORDER — PALONOSETRON HCL INJECTION 0.25 MG/5ML
0.2500 mg | Freq: Once | INTRAVENOUS | Status: AC
  Administered 2022-10-14: 0.25 mg via INTRAVENOUS
  Filled 2022-10-14: qty 5

## 2022-10-14 MED ORDER — SODIUM CHLORIDE 0.9% FLUSH
10.0000 mL | Freq: Once | INTRAVENOUS | Status: AC
  Administered 2022-10-14: 10 mL

## 2022-10-14 MED ORDER — CETIRIZINE HCL 10 MG/ML IV SOLN
10.0000 mg | Freq: Once | INTRAVENOUS | Status: AC
  Administered 2022-10-14: 10 mg via INTRAVENOUS
  Filled 2022-10-14: qty 1

## 2022-10-14 MED ORDER — FAMOTIDINE IN NACL 20-0.9 MG/50ML-% IV SOLN
20.0000 mg | Freq: Once | INTRAVENOUS | Status: AC
  Administered 2022-10-14: 20 mg via INTRAVENOUS
  Filled 2022-10-14: qty 50

## 2022-10-14 MED ORDER — SODIUM CHLORIDE 0.9 % IV SOLN
10.0000 mg | Freq: Once | INTRAVENOUS | Status: AC
  Administered 2022-10-14: 10 mg via INTRAVENOUS
  Filled 2022-10-14: qty 10

## 2022-10-14 MED ORDER — SODIUM CHLORIDE 0.9 % IV SOLN
361.5000 mg | Freq: Once | INTRAVENOUS | Status: AC
  Administered 2022-10-14: 350 mg via INTRAVENOUS
  Filled 2022-10-14: qty 35

## 2022-10-14 MED ORDER — SODIUM CHLORIDE 0.9% FLUSH
10.0000 mL | INTRAVENOUS | Status: DC | PRN
  Administered 2022-10-14: 10 mL

## 2022-10-14 NOTE — Assessment & Plan Note (Signed)
This is stable We will continue similar dose as before

## 2022-10-14 NOTE — Progress Notes (Signed)
[place] OFFICE PROGRESS NOTE  Patient Care Team: Margretta Sidle, MD as PCP - Cristino Martes, MD (Inactive) as Consulting Physician (Dermatology) Lavonna Monarch, MD (Inactive) as Consulting Physician (Dermatology)  ASSESSMENT & PLAN:  Fallopian tube cancer, carcinoma, left [facility]) She has persistent anemia, improved compared to last cycle She tolerated reduced dose of paclitaxel well We will continue similar dosing  Pancytopenia, acquired ([place]) Since recent dose adjustment, her blood counts has improved We will continue similar dose as before  Type 2 diabetes, controlled, with peripheral neuropathy ([place]) This is stable We will continue similar dose as before  No orders of the defined types were placed in this encounter.   All questions were answered. The patient knows to call the clinic with any problems, questions or concerns. The total time spent in the appointment was 20 minutes encounter with patients including review of chart and various tests results, discussions about plan of care and coordination of care plan   Heath Lark, MD 10/14/2022 10:28 AM  INTERVAL HISTORY: Please see below for problem oriented charting. she returns for treatment follow-up seen prior to cycle 5 of treatment She is doing well Denies neuropathy No nausea or constipation  REVIEW OF SYSTEMS:   Constitutional: Denies fevers, chills or abnormal weight loss Eyes: Denies blurriness of vision Ears, nose, mouth, throat, and face: Denies mucositis or sore throat Respiratory: Denies cough, dyspnea or wheezes Cardiovascular: Denies palpitation, chest discomfort or lower extremity swelling Gastrointestinal:  Denies nausea, heartburn or change in bowel habits Skin: Denies abnormal skin rashes Lymphatics: Denies new lymphadenopathy or easy bruising Neurological:Denies numbness, tingling or new weaknesses Behavioral/Psych: Mood is stable, no new changes  All other systems were  reviewed with the patient and are negative.  I have reviewed the past medical history, past surgical history, social history and family history with the patient and they are unchanged from previous note.  ALLERGIES:  is allergic to augmentin [amoxicillin-pot clavulanate] and iodinated contrast media.  MEDICATIONS:  Current Outpatient Medications  Medication Sig Dispense Refill   acetaminophen (TYLENOL) 500 MG tablet Take 500-1,000 mg by mouth every 6 (six) hours as needed (pain.).     allopurinol (ZYLOPRIM) 300 MG tablet Take 300 mg by mouth at bedtime.     amLODipine (NORVASC) 5 MG tablet Take 5 mg by mouth at bedtime.     atenolol (TENORMIN) 50 MG tablet Take 100 mg by mouth at bedtime.     atorvastatin (LIPITOR) 80 MG tablet Take 80 mg by mouth every other day. At night     cholecalciferol (VITAMIN D3) 25 MCG (1000 UNIT) tablet Take 2,000 Units by mouth at bedtime.     citalopram (CELEXA) 10 MG tablet Take 10 mg by mouth in the morning.     dexamethasone (DECADRON) 4 MG tablet Take 2 tabs at the night before and 2 tab the morning of chemotherapy, every 3 weeks, by mouth x 6 cycles (Patient not taking: Reported on 05/31/2022) 36 tablet 6   divalproex (DEPAKOTE) 250 MG DR tablet Take 750 mg by mouth in the morning.     furosemide (LASIX) 20 MG tablet Take 40 mg by mouth in the morning.     glipiZIDE (GLUCOTROL) 5 MG tablet Take 5 mg by mouth in the morning.     Lancets (ONETOUCH DELICA PLUS [id number]) MISC Apply topically daily.     levothyroxine (SYNTHROID) 50 MCG tablet Take 50 mcg by mouth daily before breakfast.     lidocaine-prilocaine (EMLA) cream Apply  to affected area once 30 g 3   miconazole (BAZA ANTIFUNGAL) 2 % cream Apply 1 Application topically 2 (two) times daily as needed (skin irritation.).     ondansetron (ZOFRAN) 8 MG tablet Take 1 tablet (8 mg total) by mouth every 8 (eight) hours as needed for nausea or vomiting. Start on the third day after chemotherapy. (Patient not  taking: Reported on 05/31/2022) 30 tablet 1   prochlorperazine (COMPAZINE) 10 MG tablet Take 1 tablet (10 mg total) by mouth every 6 (six) hours as needed for nausea or vomiting. (Patient not taking: Reported on 05/31/2022) 30 tablet 1   No current facility-administered medications for this visit.    SUMMARY OF ONCOLOGIC HISTORY: Oncology History Overview Note  HER2 negative, High grade serous Neg germline genetic testing & Neg HRD   Fallopian tube cancer, carcinoma, left ([place])  03/29/2022 Imaging   MRI of the abdomen and pelvis reveals a left adnexal/ovarian mass measuring 5.5 x 2.8 x 3.7 cm with solid diffuse enhancement and T2 signal characteristics and intermediate to accentuated T1 signal characteristics.  No significant cystic component or ascites.  Renal lesion of the right kidney lower pole is thought to represent a benign lesion.  1.3 cm cystic lesion of the head of the pancreas is recommended to be follow-up with pancreatic MRI in 2 years.   05/02/2022 Tumor Marker   Patient's tumor was tested for the following markers: CA-125. Results of the tumor marker test revealed 69.   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 Fullness appreciated within the left cul de sac. On intra-abdominal entry, normal upper abdominal survey including liver edge, diaphragm, and stomach. Normal appearing omentum. No ascites. Normal appearing small bowel. Significant inflammation within the pelvis concerning initially for recent diverticulitis versus endometriosis. Thickened and inflamed pelvic peritoneum. Filmy adhesions between the anterior cul de sac and uterus, between the right adnexa and and the sigmoid colon. Right adnexa inflammed but otherwise normal in appearing. Left fallopian tube  dilated with evidence of hydrosalpinx. Friable adhesions of the fallopian tube to the sigmoid mesentery, left ovary and left broad ligament. No ascites. Uterus bulbous and 10cm with findings of endosalpingiosis versus endometriosis on much of the uterine serosa. Dense adhesions between the bladder and cervix. Cul de sac without evidence of disease. No ascites.  Cystoscopy findings: bladder dome intact, normal efflux from bilateral ureteral orifices. On frozen section, no abnormality of the endometrium. Anterior cul de sac adhesion with findings of hemosiderin, no definitive malignancy. Fallopian tube with malignancy.  Given findings and pelvic peritoneal involvement given fallopian tube adherent to surrounding structures, no upper abdominal findings, normal omentum, and normal neuroassessment intraoperatively as well as on preoperative imaging, lymphadenectomy deferred.   05/10/2022 Pathology Results   Procedure: Hysterectomy with bilateral salpingo-oophorectomy  Specimen Integrity: Intact  Tumor Site: Left fallopian tube  Tumor Size: 6.3 x 3.8 x 2.8 cm  Histologic Type: Serous carcinoma  Histologic Grade: High-grade  Ovarian Surface Involvement: Not identified  Fallopian Tube Surface Involvement: Not identified  Implants (required for advanced stage serous/seromucinous borderline  tumors only): Not applicable  Other Tissue/ Organ Involvement: Not applicable  Largest Extrapelvic Peritoneal Focus: Not applicable  Peritoneal/Ascitic Fluid Involvement: Not identified [facility] [phone])  Chemotherapy Response Score (CRS): Not applicable, no known presurgical  therapy  Regional Lymph Nodes: Not applicable (no  lymph nodes submitted or found)  Distant Metastasis: Not applicable  Pathologic Stage Classification (pTNM, AJCC 8th Edition): pT1a, pN n/a  Ancillary Studies: Available upon request  Representative Tumor Block: B15  Comment(s): An immunohistochemistry stain for p53 is performed with  adequate  control and is diffusely positive within the tumor.  The same  stain shows patchy variable staining within the reactive mesothelium  lining the serosal adhesions within the anterior cul-de-sac    05/17/2022 Initial Diagnosis   Fallopian tube cancer, carcinoma, left ([place])   05/30/2022 Cancer Staging   Staging form: Ovary, Fallopian Tube, and Primary Peritoneal Carcinoma, AJCC 8th Edition - Pathologic stage from 05/30/2022: FIGO Stage IIB (pT2b, pN0, cM0) - Signed by Heath Lark, MD on 05/30/2022 Stage prefix: Initial diagnosis   06/10/2022 -  Chemotherapy   Patient is on Treatment Plan : OVARIAN Carboplatin (AUC 6) + Paclitaxel (175) q21d X 6 Cycles     06/10/2022 Procedure   Placement of a subcutaneous power-injectable port device. Catheter tip at the superior cavoatrial junction.      Genetic Testing   Negative genetic testing. No pathogenic variants identified on the [facility] Multi-Cancer+RNA panel. The report date is 09/06/2022.   The Multi-Cancer + RNA Panel offered by [facility] includes sequencing and/or deletion/duplication analysis of the following 70 genes:  AIP*, ALK, APC*, ATM*, AXIN2*, BAP1*, BARD1*, BLM*, BMPR1A*, BRCA1*, BRCA2*, BRIP1*, CDC73*, CDH1*, CDK4, CDKN1B*, CDKN2A, CHEK2*, CTNNA1*, DICER1*, EPCAM, EGFR, FH*, FLCN*, GREM1, HOXB13, KIT, LZTR1, MAX*, MBD4, MEN1*, MET, MITF, MLH1*, MSH2*, MSH3*, MSH6*, MUTYH*, NF1*, NF2*, NTHL1*, PALB2*, PDGFRA, PMS2*, POLD1*, POLE*, POT1*, PRKAR1A*, PTCH1*, PTEN*, RAD51C*, RAD51D*, RB1*, RET, SDHA*, SDHAF2*, SDHB*, SDHC*, SDHD*, SMAD4*, SMARCA4*, SMARCB1*, SMARCE1*, STK11*, SUFU*, TMEM127*, TP53*, TSC1*, TSC2*, VHL*. RNA analysis is performed for * genes.     PHYSICAL EXAMINATION: ECOG PERFORMANCE STATUS: 1 - Symptomatic but completely ambulatory  Vitals:   10/14/22 1022  BP: (!) 148/40  Pulse: 74  Resp: 18  Temp: 97.9 F (36.6 C)  SpO2: 98%   Filed Weights   10/14/22 1022  Weight: 222 lb 3.2 oz (100.8 kg)    GENERAL:alert, no  distress and comfortable NEURO: alert & oriented x 3 with fluent speech, no focal motor/sensory deficits  LABORATORY DATA:  I have reviewed the data as listed    Component Value Date/Time   NA 140 09/15/2022 0853   K 4.6 09/15/2022 0853   CL 104 09/15/2022 0853   CO2 25 09/15/2022 0853   GLUCOSE 216 (H) 09/15/2022 0853   BUN 50 (H) 09/15/2022 0853   CREATININE 1.43 (H) 09/15/2022 0853   CALCIUM 10.1 09/15/2022 0853   PROT 6.5 09/15/2022 0853   ALBUMIN 4.0 09/15/2022 0853   AST 14 (L) 09/15/2022 0853   ALT 9 09/15/2022 0853   ALKPHOS 74 09/15/2022 0853   BILITOT 0.5 09/15/2022 0853   GFRNONAA 37 (L) 09/15/2022 0853   GFRAA 57 (L) 12/25/2012 0850    No results found for: "SPEP", "UPEP"  Lab Results  Component Value Date   WBC 9.2 09/15/2022   NEUTROABS 8.0 (H) 09/15/2022   HGB 10.0 (L) 09/15/2022   HCT 29.9 (L) 09/15/2022   MCV 103.1 (H) 09/15/2022   PLT 170 09/15/2022      Chemistry      Component Value Date/Time   NA 140 09/15/2022 0853   K 4.6 09/15/2022 0853   CL 104 09/15/2022 0853   CO2 25 09/15/2022 0853   BUN 50 (H) 09/15/2022 0853   CREATININE 1.43 (H) 09/15/2022  6226      Component Value Date/Time   CALCIUM 10.1 09/15/2022 0853   ALKPHOS 74 09/15/2022 0853   AST 14 (L) 09/15/2022 0853   ALT 9 09/15/2022 0853   BILITOT 0.5 09/15/2022 3335

## 2022-10-14 NOTE — Assessment & Plan Note (Signed)
She has persistent anemia, improved compared to last cycle She tolerated reduced dose of paclitaxel well We will continue similar dosing

## 2022-10-14 NOTE — Assessment & Plan Note (Signed)
Since recent dose adjustment, her blood counts has improved We will continue similar dose as before 

## 2022-10-14 NOTE — Patient Instructions (Signed)
[facility]   Discharge Instructions: Thank you for choosing [facility] to provide your oncology and hematology care.   If you have a lab appointment with the [facility], please go directly to the [facility] and check in at the registration area.   Wear comfortable clothing and clothing appropriate for easy access to any Portacath or PICC line.   We strive to give you quality time with your provider. You may need to reschedule your appointment if you arrive late (15 or more minutes).  Arriving late affects you and other patients whose appointments are after yours.  Also, if you miss three or more appointments without notifying the office, you may be dismissed from the clinic at the provider's discretion.      For prescription refill requests, have your pharmacy contact our office and allow 72 hours for refills to be completed.    Today you received the following chemotherapy and/or immunotherapy agents: paclitaxel and carboplatin      To help prevent nausea and vomiting after your treatment, we encourage you to take your nausea medication as directed.  BELOW ARE SYMPTOMS THAT SHOULD BE REPORTED IMMEDIATELY: *FEVER GREATER THAN 100.4 F (38 C) OR HIGHER *CHILLS OR SWEATING *NAUSEA AND VOMITING THAT IS NOT CONTROLLED WITH YOUR NAUSEA MEDICATION *UNUSUAL SHORTNESS OF BREATH *UNUSUAL BRUISING OR BLEEDING *URINARY PROBLEMS (pain or burning when urinating, or frequent urination) *BOWEL PROBLEMS (unusual diarrhea, constipation, pain near the anus) TENDERNESS IN MOUTH AND THROAT WITH OR WITHOUT PRESENCE OF ULCERS (sore throat, sores in mouth, or a toothache) UNUSUAL RASH, SWELLING OR PAIN  UNUSUAL VAGINAL DISCHARGE OR ITCHING   Items with * indicate a potential emergency and should be followed up as soon as possible or go to the Emergency Department if any problems should occur.  Please show the CHEMOTHERAPY ALERT CARD or IMMUNOTHERAPY ALERT  CARD at check-in to the Emergency Department and triage nurse.  Should you have questions after your visit or need to cancel or reschedule your appointment, please contact [facility]  Dept: [phone]  and follow the prompts.  Office hours are 8:00 a.m. to 4:30 p.m. Monday - Friday. Please note that voicemails left after 4:00 p.m. may not be returned until the following business day.  We are closed weekends and major holidays. You have access to a nurse at all times for urgent questions. Please call the main number to the clinic Dept: [phone] and follow the prompts.   For any non-urgent questions, you may also contact your provider using MyChart. We now offer e-Visits for anyone 64 and older to request care online for non-urgent symptoms. For details visit [email or web address].   Also download the MyChart app! Go to the app store, search "MyChart", open the app, select [facility], and log in with your MyChart username and password.  Masks are optional in the cancer centers. If you would like for your care team to wear a mask while they are taking care of you, please let them know. You may have one support Mak Bonny who is at least 83 years old accompany you for your appointments.

## 2022-10-19 ENCOUNTER — Other Ambulatory Visit: Payer: Self-pay

## 2022-11-10 ENCOUNTER — Other Ambulatory Visit: Payer: Self-pay | Admitting: Hematology and Oncology

## 2022-11-10 DIAGNOSIS — C5702 Malignant neoplasm of left fallopian tube: Secondary | ICD-10-CM

## 2022-11-10 MED FILL — Fosaprepitant Dimeglumine For IV Infusion 150 MG (Base Eq): INTRAVENOUS | Qty: 5 | Status: AC

## 2022-11-10 MED FILL — Dexamethasone Sodium Phosphate Inj 100 MG/10ML: INTRAMUSCULAR | Qty: 1 | Status: AC

## 2022-11-11 ENCOUNTER — Inpatient Hospital Stay: Payer: Medicare Other

## 2022-11-11 ENCOUNTER — Encounter: Payer: Self-pay | Admitting: Hematology and Oncology

## 2022-11-11 ENCOUNTER — Inpatient Hospital Stay: Payer: Medicare Other | Attending: Gynecologic Oncology | Admitting: Hematology and Oncology

## 2022-11-11 ENCOUNTER — Other Ambulatory Visit: Payer: Self-pay

## 2022-11-11 VITALS — BP 159/42 | HR 92 | Temp 98.9°F | Resp 18 | Ht 63.0 in | Wt 222.8 lb

## 2022-11-11 DIAGNOSIS — D61818 Other pancytopenia: Secondary | ICD-10-CM | POA: Diagnosis not present

## 2022-11-11 DIAGNOSIS — C5702 Malignant neoplasm of left fallopian tube: Secondary | ICD-10-CM

## 2022-11-11 DIAGNOSIS — Z5111 Encounter for antineoplastic chemotherapy: Secondary | ICD-10-CM | POA: Diagnosis present

## 2022-11-11 DIAGNOSIS — N183 Chronic kidney disease, stage 3 unspecified: Secondary | ICD-10-CM

## 2022-11-11 DIAGNOSIS — Z79899 Other long term (current) drug therapy: Secondary | ICD-10-CM | POA: Diagnosis not present

## 2022-11-11 LAB — CBC WITH DIFFERENTIAL/PLATELET
Abs Immature Granulocytes: 0.09 10*3/uL — ABNORMAL HIGH (ref 0.00–0.07)
Basophils Absolute: 0 10*3/uL (ref 0.0–0.1)
Basophils Relative: 0 %
Eosinophils Absolute: 0 10*3/uL (ref 0.0–0.5)
Eosinophils Relative: 0 %
HCT: 30 % — ABNORMAL LOW (ref 36.0–46.0)
Hemoglobin: 10.3 g/dL — ABNORMAL LOW (ref 12.0–15.0)
Immature Granulocytes: 1 %
Lymphocytes Relative: 16 %
Lymphs Abs: 1.3 10*3/uL (ref 0.7–4.0)
MCH: 35.5 pg — ABNORMAL HIGH (ref 26.0–34.0)
MCHC: 34.3 g/dL (ref 30.0–36.0)
MCV: 103.4 fL — ABNORMAL HIGH (ref 80.0–100.0)
Monocytes Absolute: 0.1 10*3/uL (ref 0.1–1.0)
Monocytes Relative: 2 %
Neutro Abs: 6.5 10*3/uL (ref 1.7–7.7)
Neutrophils Relative %: 81 %
Platelets: 186 10*3/uL (ref 150–400)
RBC: 2.9 MIL/uL — ABNORMAL LOW (ref 3.87–5.11)
RDW: 16.2 % — ABNORMAL HIGH (ref 11.5–15.5)
WBC: 8 10*3/uL (ref 4.0–10.5)
nRBC: 0.3 % — ABNORMAL HIGH (ref 0.0–0.2)

## 2022-11-11 LAB — COMPREHENSIVE METABOLIC PANEL
ALT: 12 U/L (ref 0–44)
AST: 14 U/L — ABNORMAL LOW (ref 15–41)
Albumin: 4 g/dL (ref 3.5–5.0)
Alkaline Phosphatase: 76 U/L (ref 38–126)
Anion gap: 14 (ref 5–15)
BUN: 45 mg/dL — ABNORMAL HIGH (ref 8–23)
CO2: 22 mmol/L (ref 22–32)
Calcium: 10 mg/dL (ref 8.9–10.3)
Chloride: 104 mmol/L (ref 98–111)
Creatinine, Ser: 1.39 mg/dL — ABNORMAL HIGH (ref 0.44–1.00)
GFR, Estimated: 38 mL/min — ABNORMAL LOW (ref >60)
Glucose, Bld: 117 mg/dL — ABNORMAL HIGH (ref 70–99)
Potassium: 4.9 mmol/L (ref 3.5–5.1)
Sodium: 140 mmol/L (ref 135–145)
Total Bilirubin: 0.3 mg/dL (ref 0.3–1.2)
Total Protein: 6.9 g/dL (ref 6.5–8.1)

## 2022-11-11 MED ORDER — SODIUM CHLORIDE 0.9 % IV SOLN
375.0000 mg | Freq: Once | INTRAVENOUS | Status: AC
  Administered 2022-11-11: 400 mg via INTRAVENOUS
  Filled 2022-11-11: qty 40

## 2022-11-11 MED ORDER — SODIUM CHLORIDE 0.9% FLUSH
10.0000 mL | INTRAVENOUS | Status: DC | PRN
  Administered 2022-11-11: 10 mL

## 2022-11-11 MED ORDER — CETIRIZINE HCL 10 MG/ML IV SOLN
10.0000 mg | Freq: Once | INTRAVENOUS | Status: AC
  Administered 2022-11-11: 10 mg via INTRAVENOUS
  Filled 2022-11-11: qty 1

## 2022-11-11 MED ORDER — PALONOSETRON HCL INJECTION 0.25 MG/5ML
0.2500 mg | Freq: Once | INTRAVENOUS | Status: AC
  Administered 2022-11-11: 0.25 mg via INTRAVENOUS
  Filled 2022-11-11: qty 5

## 2022-11-11 MED ORDER — SODIUM CHLORIDE 0.9 % IV SOLN
87.5000 mg/m2 | Freq: Once | INTRAVENOUS | Status: AC
  Administered 2022-11-11: 186 mg via INTRAVENOUS
  Filled 2022-11-11: qty 31

## 2022-11-11 MED ORDER — SODIUM CHLORIDE 0.9% FLUSH
10.0000 mL | Freq: Once | INTRAVENOUS | Status: AC
  Administered 2022-11-11: 10 mL

## 2022-11-11 MED ORDER — SODIUM CHLORIDE 0.9 % IV SOLN
10.0000 mg | Freq: Once | INTRAVENOUS | Status: AC
  Administered 2022-11-11: 10 mg via INTRAVENOUS
  Filled 2022-11-11: qty 10

## 2022-11-11 MED ORDER — SODIUM CHLORIDE 0.9 % IV SOLN: Freq: Once | INTRAVENOUS | Status: AC

## 2022-11-11 MED ORDER — FAMOTIDINE IN NACL 20-0.9 MG/50ML-% IV SOLN
20.0000 mg | Freq: Once | INTRAVENOUS | Status: AC
  Administered 2022-11-11: 20 mg via INTRAVENOUS
  Filled 2022-11-11: qty 50

## 2022-11-11 MED ORDER — HEPARIN SOD (PORK) LOCK FLUSH 100 UNIT/ML IV SOLN
500.0000 [IU] | Freq: Once | INTRAVENOUS | Status: AC | PRN
  Administered 2022-11-11: 500 [IU]

## 2022-11-11 MED ORDER — SODIUM CHLORIDE 0.9 % IV SOLN
150.0000 mg | Freq: Once | INTRAVENOUS | Status: AC
  Administered 2022-11-11: 150 mg via INTRAVENOUS
  Filled 2022-11-11: qty 150

## 2022-11-11 NOTE — Patient Instructions (Addendum)
[facility]   Discharge Instructions: Thank you for choosing [facility] to provide your oncology and hematology care.   If you have a lab appointment with the [facility], please go directly to the [facility] and check in at the registration area.   Wear comfortable clothing and clothing appropriate for easy access to any Portacath or PICC line.   We strive to give you quality time with your provider. You may need to reschedule your appointment if you arrive late (15 or more minutes).  Arriving late affects you and other patients whose appointments are after yours.  Also, if you miss three or more appointments without notifying the office, you may be dismissed from the clinic at the provider's discretion.      For prescription refill requests, have your pharmacy contact our office and allow 72 hours for refills to be completed.    Today you received the following chemotherapy and/or immunotherapy agents: paclitaxel and carboplatin      To help prevent nausea and vomiting after your treatment, we encourage you to take your nausea medication as directed.  BELOW ARE SYMPTOMS THAT SHOULD BE REPORTED IMMEDIATELY: *FEVER GREATER THAN 100.4 F (38 C) OR HIGHER *CHILLS OR SWEATING *NAUSEA AND VOMITING THAT IS NOT CONTROLLED WITH YOUR NAUSEA MEDICATION *UNUSUAL SHORTNESS OF BREATH *UNUSUAL BRUISING OR BLEEDING *URINARY PROBLEMS (pain or burning when urinating, or frequent urination) *BOWEL PROBLEMS (unusual diarrhea, constipation, pain near the anus) TENDERNESS IN MOUTH AND THROAT WITH OR WITHOUT PRESENCE OF ULCERS (sore throat, sores in mouth, or a toothache) UNUSUAL RASH, SWELLING OR PAIN  UNUSUAL VAGINAL DISCHARGE OR ITCHING   Items with * indicate a potential emergency and should be followed up as soon as possible or go to the Emergency Department if any problems should occur.  Please show the CHEMOTHERAPY ALERT CARD or IMMUNOTHERAPY  ALERT CARD at check-in to the Emergency Department and triage nurse.  Should you have questions after your visit or need to cancel or reschedule your appointment, please contact [facility]  Dept: [phone]  and follow the prompts.  Office hours are 8:00 a.m. to 4:30 p.m. Monday - Friday. Please note that voicemails left after 4:00 p.m. may not be returned until the following business day.  We are closed weekends and major holidays. You have access to a nurse at all times for urgent questions. Please call the main number to the clinic Dept: [phone] and follow the prompts.   For any non-urgent questions, you may also contact your provider using MyChart. We now offer e-Visits for anyone 15 and older to request care online for non-urgent symptoms. For details visit [email or web address].   Also download the MyChart app! Go to the app store, search "MyChart", open the app, select [facility], and log in with your MyChart username and password.  Masks are optional in the cancer centers. If you would like for your care team to wear a mask while they are taking care of you, please let them know. You may have one support Krista Som who is at least 83 years old accompany you for your appointments.

## 2022-11-11 NOTE — Progress Notes (Signed)
[facility] OFFICE PROGRESS NOTE  Patient Care Team: Margretta Sidle, MD as PCP - Cristino Martes, MD (Inactive) as Consulting Physician (Dermatology) Lavonna Monarch, MD (Inactive) as Consulting Physician (Dermatology)  ASSESSMENT & PLAN:  Fallopian tube cancer, carcinoma, left ([place]) She tolerated reduced dose of paclitaxel well We will continue similar dosing I plan to repeat imaging study after completion of cycle 6 of therapy  Pancytopenia, acquired ([facility]) Since recent dose adjustment, her blood counts has improved We will continue similar dose as before  CKD (chronic kidney disease), stage III ([facility]) We discussed risk factors for worsening kidney failure We discussed importance of adequate hydration and will adjust dose of chemo accordingly Due to history of allergy to IV  contrast, I would not prescribe CT imaging with contrast  Orders Placed This Encounter  Procedures   CT Abdomen Pelvis Wo Contrast    Standing Status:   Future    Standing Expiration Date:   11/11/2023    Order Specific Question:   Preferred imaging location?    Answer:   [facility]    Order Specific Question:   Is Oral Contrast requested for this exam?    Answer:   Yes, Per Radiology protocol    Order Specific Question:   Does the patient have a contrast media/X-ray dye allergy?    Answer:   Yes    All questions were answered. The patient knows to call the clinic with any problems, questions or concerns. The total time spent in the appointment was 20 minutes encounter with patients including review of chart and various tests results, discussions about plan of care and coordination of care plan   Heath Lark, MD 11/11/2022 11:09 AM  INTERVAL HISTORY: Please see below for problem oriented charting. she returns for treatment follow-up with her children She is doing well She tolerated last cycle of therapy well No major side effects or progression of peripheral  neuropathy  REVIEW OF SYSTEMS:   Constitutional: Denies fevers, chills or abnormal weight loss Eyes: Denies blurriness of vision Ears, nose, mouth, throat, and face: Denies mucositis or sore throat Respiratory: Denies cough, dyspnea or wheezes Cardiovascular: Denies palpitation, chest discomfort or lower extremity swelling Gastrointestinal:  Denies nausea, heartburn or change in bowel habits Skin: Denies abnormal skin rashes Lymphatics: Denies new lymphadenopathy or easy bruising Neurological:Denies numbness, tingling or new weaknesses Behavioral/Psych: Mood is stable, no new changes  All other systems were reviewed with the patient and are negative.  I have reviewed the past medical history, past surgical history, social history and family history with the patient and they are unchanged from previous note.  ALLERGIES:  is allergic to augmentin [amoxicillin-pot clavulanate] and iodinated contrast media.  MEDICATIONS:  Current Outpatient Medications  Medication Sig Dispense Refill   acetaminophen (TYLENOL) 500 MG tablet Take 500-1,000 mg by mouth every 6 (six) hours as needed (pain.).     allopurinol (ZYLOPRIM) 300 MG tablet Take 300 mg by mouth at bedtime.     amLODipine (NORVASC) 5 MG tablet Take 5 mg by mouth at bedtime.     atenolol (TENORMIN) 50 MG tablet Take 100 mg by mouth at bedtime.     atorvastatin (LIPITOR) 80 MG tablet Take 80 mg by mouth every other day. At night     cholecalciferol (VITAMIN D3) 25 MCG (1000 UNIT) tablet Take 2,000 Units by mouth at bedtime.     citalopram (CELEXA) 10 MG tablet Take 10 mg by mouth in the morning.  dexamethasone (DECADRON) 4 MG tablet Take 2 tabs at the night before and 2 tab the morning of chemotherapy, every 3 weeks, by mouth x 6 cycles (Patient not taking: Reported on 05/31/2022) 36 tablet 6   divalproex (DEPAKOTE) 250 MG DR tablet Take 750 mg by mouth in the morning.     furosemide (LASIX) 20 MG tablet Take 40 mg by mouth in the  morning.     glipiZIDE (GLUCOTROL) 5 MG tablet Take 5 mg by mouth in the morning.     Lancets (ONETOUCH DELICA PLUS [id number]) MISC Apply topically daily.     levothyroxine (SYNTHROID) 50 MCG tablet Take 50 mcg by mouth daily before breakfast.     lidocaine-prilocaine (EMLA) cream Apply to affected area once 30 g 3   miconazole (BAZA ANTIFUNGAL) 2 % cream Apply 1 Application topically 2 (two) times daily as needed (skin irritation.).     ondansetron (ZOFRAN) 8 MG tablet Take 1 tablet (8 mg total) by mouth every 8 (eight) hours as needed for nausea or vomiting. Start on the third day after chemotherapy. (Patient not taking: Reported on 05/31/2022) 30 tablet 1   prochlorperazine (COMPAZINE) 10 MG tablet Take 1 tablet (10 mg total) by mouth every 6 (six) hours as needed for nausea or vomiting. (Patient not taking: Reported on 05/31/2022) 30 tablet 1   No current facility-administered medications for this visit.    SUMMARY OF ONCOLOGIC HISTORY: Oncology History Overview Note  HER2 negative, High grade serous Neg germline genetic testing & Neg HRD   Fallopian tube cancer, carcinoma, left ([place])  03/29/2022 Imaging   MRI of the abdomen and pelvis reveals a left adnexal/ovarian mass measuring 5.5 x 2.8 x 3.7 cm with solid diffuse enhancement and T2 signal characteristics and intermediate to accentuated T1 signal characteristics.  No significant cystic component or ascites.  Renal lesion of the right kidney lower pole is thought to represent a benign lesion.  1.3 cm cystic lesion of the head of the pancreas is recommended to be follow-up with pancreatic MRI in 2 years.   05/02/2022 Tumor Marker   Patient's tumor was tested for the following markers: CA-125. Results of the tumor marker test revealed 69.   05/10/2022 Surgery   Robotic-assisted laparoscopic total hysterectomy with bilateral salpingo-oophorectomy, lysis of adhesions, cystoscopy  At least stage IIB carcinoma of the fallopian tube, suspected    Findings:  On EUA, mildly enlarged, somewhat mobile uterus. Fullness appreciated within the left cul de sac. On intra-abdominal entry, normal upper abdominal survey including liver edge, diaphragm, and stomach. Normal appearing omentum. No ascites. Normal appearing small bowel. Significant inflammation within the pelvis concerning initially for recent diverticulitis versus endometriosis. Thickened and inflamed pelvic peritoneum. Filmy adhesions between the anterior cul de sac and uterus, between the right adnexa and and the sigmoid colon. Right adnexa inflammed but otherwise normal in appearing. Left fallopian tube dilated with evidence of hydrosalpinx. Friable adhesions of the fallopian tube to the sigmoid mesentery, left ovary and left broad ligament. No ascites. Uterus bulbous and 10cm with findings of endosalpingiosis versus endometriosis on much of the uterine serosa. Dense adhesions between the bladder and cervix. Cul de sac without evidence of disease. No ascites.  Cystoscopy findings: bladder dome intact, normal efflux from bilateral ureteral orifices. On frozen section, no abnormality of the endometrium. Anterior cul de sac adhesion with findings of hemosiderin, no definitive malignancy. Fallopian tube with malignancy.  Given findings and pelvic peritoneal involvement given fallopian tube adherent to surrounding structures, no  upper abdominal findings, normal omentum, and normal neuroassessment intraoperatively as well as on preoperative imaging, lymphadenectomy deferred.   05/10/2022 Pathology Results   Procedure: Hysterectomy with bilateral salpingo-oophorectomy  Specimen Integrity: Intact  Tumor Site: Left fallopian tube  Tumor Size: 6.3 x 3.8 x 2.8 cm  Histologic Type: Serous carcinoma  Histologic Grade: High-grade  Ovarian Surface Involvement: Not identified  Fallopian Tube Surface Involvement: Not identified  Implants (required for advanced stage serous/seromucinous borderline  tumors  only): Not applicable  Other Tissue/ Organ Involvement: Not applicable  Largest Extrapelvic Peritoneal Focus: Not applicable  Peritoneal/Ascitic Fluid Involvement: Not identified [facility] [phone])  Chemotherapy Response Score (CRS): Not applicable, no known presurgical  therapy  Regional Lymph Nodes: Not applicable (no lymph nodes submitted or found)  Distant Metastasis: Not applicable  Pathologic Stage Classification (pTNM, AJCC 8th Edition): pT1a, pN n/a  Ancillary Studies: Available upon request  Representative Tumor Block: B15  Comment(s): An immunohistochemistry stain for p53 is performed with  adequate control and is diffusely positive within the tumor.  The same  stain shows patchy variable staining within the reactive mesothelium  lining the serosal adhesions within the anterior cul-de-sac    05/17/2022 Initial Diagnosis   Fallopian tube cancer, carcinoma, left ([place])   05/30/2022 Cancer Staging   Staging form: Ovary, Fallopian Tube, and Primary Peritoneal Carcinoma, AJCC 8th Edition - Pathologic stage from 05/30/2022: FIGO Stage IIB (pT2b, pN0, cM0) - Signed by Heath Lark, MD on 05/30/2022 Stage prefix: Initial diagnosis   06/10/2022 -  Chemotherapy   Patient is on Treatment Plan : OVARIAN Carboplatin (AUC 6) + Paclitaxel (175) q21d X 6 Cycles     06/10/2022 Procedure   Placement of a subcutaneous power-injectable port device. Catheter tip at the superior cavoatrial junction.      Genetic Testing   Negative genetic testing. No pathogenic variants identified on the [facility] Multi-Cancer+RNA panel. The report date is 09/06/2022.   The Multi-Cancer + RNA Panel offered by [facility] includes sequencing and/or deletion/duplication analysis of the following 70 genes:  AIP*, ALK, APC*, ATM*, AXIN2*, BAP1*, BARD1*, BLM*, BMPR1A*, BRCA1*, BRCA2*, BRIP1*, CDC73*, CDH1*, CDK4, CDKN1B*, CDKN2A, CHEK2*, CTNNA1*, DICER1*, EPCAM, EGFR, FH*, FLCN*, GREM1, HOXB13, KIT, LZTR1, MAX*, MBD4, MEN1*, MET, MITF,  MLH1*, MSH2*, MSH3*, MSH6*, MUTYH*, NF1*, NF2*, NTHL1*, PALB2*, PDGFRA, PMS2*, POLD1*, POLE*, POT1*, PRKAR1A*, PTCH1*, PTEN*, RAD51C*, RAD51D*, RB1*, RET, SDHA*, SDHAF2*, SDHB*, SDHC*, SDHD*, SMAD4*, SMARCA4*, SMARCB1*, SMARCE1*, STK11*, SUFU*, TMEM127*, TP53*, TSC1*, TSC2*, VHL*. RNA analysis is performed for * genes.     PHYSICAL EXAMINATION: ECOG PERFORMANCE STATUS: 1 - Symptomatic but completely ambulatory  Vitals:   11/11/22 1049  BP: (!) 159/42  Pulse: 92  Resp: 18  Temp: 98.9 F (37.2 C)  SpO2: 100%   Filed Weights   11/11/22 1049  Weight: 222 lb 12.8 oz (101.1 kg)    GENERAL:alert, no distress and comfortable NEURO: alert & oriented x 3 with fluent speech, no focal motor/sensory deficits  LABORATORY DATA:  I have reviewed the data as listed    Component Value Date/Time   NA 140 10/14/2022 1004   K 4.5 10/14/2022 1004   CL 102 10/14/2022 1004   CO2 24 10/14/2022 1004   GLUCOSE 127 (H) 10/14/2022 1004   BUN 54 (H) 10/14/2022 1004   CREATININE 1.47 (H) 10/14/2022 1004   CALCIUM 10.2 10/14/2022 1004   PROT 7.1 10/14/2022 1004   ALBUMIN 4.1 10/14/2022 1004   AST 14 (L) 10/14/2022 1004   ALT 10 10/14/2022 1004   ALKPHOS  84 10/14/2022 1004   BILITOT 0.4 10/14/2022 1004   GFRNONAA 35 (L) 10/14/2022 1004   GFRAA 57 (L) 12/25/2012 0850    No results found for: "SPEP", "UPEP"  Lab Results  Component Value Date   WBC 8.0 11/11/2022   NEUTROABS 6.5 11/11/2022   HGB 10.3 (L) 11/11/2022   HCT 30.0 (L) 11/11/2022   MCV 103.4 (H) 11/11/2022   PLT 186 11/11/2022      Chemistry      Component Value Date/Time   NA 140 10/14/2022 1004   K 4.5 10/14/2022 1004   CL 102 10/14/2022 1004   CO2 24 10/14/2022 1004   BUN 54 (H) 10/14/2022 1004   CREATININE 1.47 (H) 10/14/2022 1004      Component Value Date/Time   CALCIUM 10.2 10/14/2022 1004   ALKPHOS 84 10/14/2022 1004   AST 14 (L) 10/14/2022 1004   ALT 10 10/14/2022 1004   BILITOT 0.4 10/14/2022 1004

## 2022-11-11 NOTE — Assessment & Plan Note (Signed)
She tolerated reduced dose of paclitaxel well We will continue similar dosing I plan to repeat imaging study after completion of cycle 6 of therapy

## 2022-11-11 NOTE — Assessment & Plan Note (Addendum)
We discussed risk factors for worsening kidney failure We discussed importance of adequate hydration and will adjust dose of chemo accordingly Due to history of allergy to IV  contrast, I would not prescribe CT imaging with contrast

## 2022-11-11 NOTE — Assessment & Plan Note (Signed)
Since recent dose adjustment, her blood counts has improved We will continue similar dose as before

## 2022-11-12 ENCOUNTER — Other Ambulatory Visit: Payer: Self-pay

## 2022-11-12 LAB — CA 125: Cancer Antigen (CA) 125: 13.9 U/mL (ref 0.0–38.1)

## 2022-12-09 ENCOUNTER — Ambulatory Visit (HOSPITAL_BASED_OUTPATIENT_CLINIC_OR_DEPARTMENT_OTHER)
Admission: RE | Admit: 2022-12-09 | Discharge: 2022-12-09 | Disposition: A | Discharge status: Home / Self Care | Payer: Medicare Other | Source: Ambulatory Visit | Attending: Hematology and Oncology | Admitting: Hematology and Oncology

## 2022-12-09 DIAGNOSIS — C5702 Malignant neoplasm of left fallopian tube: Secondary | ICD-10-CM | POA: Diagnosis present

## 2022-12-13 ENCOUNTER — Encounter: Payer: Self-pay | Admitting: Hematology and Oncology

## 2022-12-13 ENCOUNTER — Inpatient Hospital Stay: Payer: Medicare Other | Attending: Gynecologic Oncology | Admitting: Hematology and Oncology

## 2022-12-13 ENCOUNTER — Other Ambulatory Visit: Payer: Self-pay

## 2022-12-13 VITALS — BP 137/47 | HR 68 | Temp 97.6°F | Resp 18 | Ht 63.0 in | Wt 222.4 lb

## 2022-12-13 DIAGNOSIS — Z79899 Other long term (current) drug therapy: Secondary | ICD-10-CM | POA: Insufficient documentation

## 2022-12-13 DIAGNOSIS — C5702 Malignant neoplasm of left fallopian tube: Secondary | ICD-10-CM | POA: Diagnosis present

## 2022-12-13 NOTE — Assessment & Plan Note (Signed)
I reviewed CT imaging with her daughter She has no signs of cancer We discussed risk and benefits of port maintenance and she would like her port removed She will follow-up with GYN surgeon in 3 months We discussed signs and symptoms to watch out for cancer recurrence

## 2022-12-13 NOTE — Progress Notes (Signed)
[place] OFFICE PROGRESS NOTE  Patient Care Team: Margretta Sidle, MD as PCP - General Lavonna Monarch, MD (Inactive) as Consulting Physician (Dermatology) Lavonna Monarch, MD (Inactive) as Consulting Physician (Dermatology)  ASSESSMENT & PLAN:  Fallopian tube cancer, carcinoma, left [facility]) I reviewed CT imaging with her daughter Sarah Bishop has no signs of cancer We discussed risk and benefits of port maintenance and Sarah Bishop would like her port removed Sarah Bishop will follow-up with GYN surgeon in 3 months We discussed signs and symptoms to watch out for cancer recurrence  Orders Placed This Encounter  Procedures   IR REMOVAL TUN ACCESS W/ PORT W/O FL MOD SED    Standing Status:   Future    Standing Expiration Date:   12/13/2023    Order Specific Question:   Reason for exam:    Answer:   no need port    Order Specific Question:   Preferred Imaging Location?    Answer:   [facility]   CT Abdomen Pelvis Wo Contrast    Standing Status:   Future    Standing Expiration Date:   12/13/2023    Order Specific Question:   Preferred imaging location?    Answer:   [facility]    Order Specific Question:   Is Oral Contrast requested for this exam?    Answer:   Yes, Per Radiology protocol    Order Specific Question:   Does the patient have a contrast media/X-ray dye allergy?    Answer:   Yes    All questions were answered. The patient knows to call the clinic with any problems, questions or concerns. The total time spent in the appointment was 25 minutes encounter with patients including review of chart and various tests results, discussions about plan of care and coordination of care plan   Heath Lark, MD 12/13/2022 2:48 PM  INTERVAL HISTORY: Please see below for problem oriented charting. Sarah Bishop returns for review of test results Sarah Bishop tolerated last cycle of treatment well We spent majority of our time reviewing test results and CT imaging  REVIEW OF SYSTEMS:    Constitutional: Denies fevers, chills or abnormal weight loss Eyes: Denies blurriness of vision Ears, nose, mouth, throat, and face: Denies mucositis or sore throat Respiratory: Denies cough, dyspnea or wheezes Cardiovascular: Denies palpitation, chest discomfort or lower extremity swelling Gastrointestinal:  Denies nausea, heartburn or change in bowel habits Skin: Denies abnormal skin rashes Lymphatics: Denies new lymphadenopathy or easy bruising Neurological:Denies numbness, tingling or new weaknesses Behavioral/Psych: Mood is stable, no new changes  All other systems were reviewed with the patient and are negative.  I have reviewed the past medical history, past surgical history, social history and family history with the patient and they are unchanged from previous note.  ALLERGIES:  is allergic to augmentin [amoxicillin-pot clavulanate] and iodinated contrast media.  MEDICATIONS:  Current Outpatient Medications  Medication Sig Dispense Refill   acetaminophen (TYLENOL) 500 MG tablet Take 500-1,000 mg by mouth every 6 (six) hours as needed (pain.).     allopurinol (ZYLOPRIM) 300 MG tablet Take 300 mg by mouth at bedtime.     amLODipine (NORVASC) 5 MG tablet Take 5 mg by mouth at bedtime.     atenolol (TENORMIN) 50 MG tablet Take 100 mg by mouth at bedtime.     atorvastatin (LIPITOR) 80 MG tablet Take 80 mg by mouth every other day. At night     cholecalciferol (VITAMIN D3) 25 MCG (1000 UNIT) tablet Take 2,000  Units by mouth at bedtime.     citalopram (CELEXA) 10 MG tablet Take 10 mg by mouth in the morning.     divalproex (DEPAKOTE) 250 MG DR tablet Take 750 mg by mouth in the morning.     furosemide (LASIX) 20 MG tablet Take 40 mg by mouth in the morning.     glipiZIDE (GLUCOTROL) 5 MG tablet Take 5 mg by mouth in the morning.     Lancets (ONETOUCH DELICA PLUS [id number]) MISC Apply topically daily.     levothyroxine (SYNTHROID) 50 MCG tablet Take 50 mcg by mouth daily before  breakfast.     miconazole (BAZA ANTIFUNGAL) 2 % cream Apply 1 Application topically 2 (two) times daily as needed (skin irritation.).     No current facility-administered medications for this visit.    SUMMARY OF ONCOLOGIC HISTORY: Oncology History Overview Note  HER2 negative, High grade serous Neg germline genetic testing & Neg HRD   Fallopian tube cancer, carcinoma, left ([place])  03/29/2022 Imaging   MRI of the abdomen and pelvis reveals a left adnexal/ovarian mass measuring 5.5 x 2.8 x 3.7 cm with solid diffuse enhancement and T2 signal characteristics and intermediate to accentuated T1 signal characteristics.  No significant cystic component or ascites.  Renal lesion of the right kidney lower pole is thought to represent a benign lesion.  1.3 cm cystic lesion of the head of the pancreas is recommended to be follow-up with pancreatic MRI in 2 years.   05/02/2022 Tumor Marker   Patient's tumor was tested for the following markers: CA-125. Results of the tumor marker test revealed 69.   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 Fullness appreciated within the left cul de sac. On intra-abdominal entry, normal upper abdominal survey including liver edge, diaphragm, and stomach. Normal appearing omentum. No ascites. Normal appearing small bowel. Significant inflammation within the pelvis concerning initially for recent diverticulitis versus endometriosis. Thickened and inflamed pelvic peritoneum. Filmy adhesions between the anterior cul de sac and uterus, between the right adnexa and and the sigmoid colon. Right adnexa inflammed but otherwise normal in appearing. Left fallopian tube dilated with evidence of hydrosalpinx. Friable adhesions of the fallopian tube to the sigmoid  mesentery, left ovary and left broad ligament. No ascites. Uterus bulbous and 10cm with findings of endosalpingiosis versus endometriosis on much of the uterine serosa. Dense adhesions between the bladder and cervix. Cul de sac without evidence of disease. No ascites.  Cystoscopy findings: bladder dome intact, normal efflux from bilateral ureteral orifices. On frozen section, no abnormality of the endometrium. Anterior cul de sac adhesion with findings of hemosiderin, no definitive malignancy. Fallopian tube with malignancy.  Given findings and pelvic peritoneal involvement given fallopian tube adherent to surrounding structures, no upper abdominal findings, normal omentum, and normal neuroassessment intraoperatively as well as on preoperative imaging, lymphadenectomy deferred.   05/10/2022 Pathology Results   Procedure: Hysterectomy with bilateral salpingo-oophorectomy  Specimen Integrity: Intact  Tumor Site: Left fallopian tube  Tumor Size: 6.3 x 3.8 x 2.8 cm  Histologic Type: Serous carcinoma  Histologic Grade: High-grade  Ovarian Surface Involvement: Not identified  Fallopian Tube Surface Involvement: Not identified  Implants (required for advanced stage serous/seromucinous borderline  tumors only): Not applicable  Other Tissue/ Organ Involvement: Not applicable  Largest Extrapelvic Peritoneal Focus: Not applicable  Peritoneal/Ascitic Fluid  Involvement: Not identified [facility] [phone])  Chemotherapy Response Score (CRS): Not applicable, no known presurgical  therapy  Regional Lymph Nodes: Not applicable (no lymph nodes submitted or found)  Distant Metastasis: Not applicable  Pathologic Stage Classification (pTNM, AJCC 8th Edition): pT1a, pN n/a  Ancillary Studies: Available upon request  Representative Tumor Block: B15  Comment(s): An immunohistochemistry stain for p53 is performed with  adequate control and is diffusely positive within the tumor.  The same  stain shows patchy variable  staining within the reactive mesothelium  lining the serosal adhesions within the anterior cul-de-sac    05/17/2022 Initial Diagnosis   Fallopian tube cancer, carcinoma, left ([place])   05/30/2022 Cancer Staging   Staging form: Ovary, Fallopian Tube, and Primary Peritoneal Carcinoma, AJCC 8th Edition - Pathologic stage from 05/30/2022: FIGO Stage IIB (pT2b, pN0, cM0) - Signed by Heath Lark, MD on 05/30/2022 Stage prefix: Initial diagnosis   06/10/2022 - 11/11/2022 Chemotherapy   Patient is on Treatment Plan : OVARIAN Carboplatin (AUC 6) + Paclitaxel (175) q21d X 6 Cycles     06/10/2022 Procedure   Placement of a subcutaneous power-injectable port device. Catheter tip at the superior cavoatrial junction.      Genetic Testing   Negative genetic testing. No pathogenic variants identified on the [facility] Multi-Cancer+RNA panel. The report date is 09/06/2022.   The Multi-Cancer + RNA Panel offered by [facility] includes sequencing and/or deletion/duplication analysis of the following 70 genes:  AIP*, ALK, APC*, ATM*, AXIN2*, BAP1*, BARD1*, BLM*, BMPR1A*, BRCA1*, BRCA2*, BRIP1*, CDC73*, CDH1*, CDK4, CDKN1B*, CDKN2A, CHEK2*, CTNNA1*, DICER1*, EPCAM, EGFR, FH*, FLCN*, GREM1, HOXB13, KIT, LZTR1, MAX*, MBD4, MEN1*, MET, MITF, MLH1*, MSH2*, MSH3*, MSH6*, MUTYH*, NF1*, NF2*, NTHL1*, PALB2*, PDGFRA, PMS2*, POLD1*, POLE*, POT1*, PRKAR1A*, PTCH1*, PTEN*, RAD51C*, RAD51D*, RB1*, RET, SDHA*, SDHAF2*, SDHB*, SDHC*, SDHD*, SMAD4*, SMARCA4*, SMARCB1*, SMARCE1*, STK11*, SUFU*, TMEM127*, TP53*, TSC1*, TSC2*, VHL*. RNA analysis is performed for * genes.   11/14/2022 Tumor Marker   Patient's tumor was tested for the following markers: CA-125. Results of the tumor marker test revealed 13.9.   12/12/2022 Imaging   1. No findings in the pelvis to suggest local recurrence. No evidence for metastatic disease in the abdomen or pelvis. 2. The cystic pancreatic head lesion seen on MRI is not well demonstrated by CT but measures  approximately 10 mm. Continued attention on restaging scans recommended. Previous MRI recommended 2 year interval MRI follow-up. 3. Cholelithiasis. 4.  Aortic Atherosclerosis (ICD10-I70.0).     PHYSICAL EXAMINATION: ECOG PERFORMANCE STATUS: 1 - Symptomatic but completely ambulatory  Vitals:   12/13/22 1347  BP: (!) 137/47  Pulse: 68  Resp: 18  Temp: 97.6 F (36.4 C)  SpO2: 98%   Filed Weights   12/13/22 1347  Weight: 222 lb 6.4 oz (100.9 kg)    GENERAL:alert, no distress and comfortable  NEURO: alert & oriented x 3 with fluent speech, no focal motor/sensory deficits  LABORATORY DATA:  I have reviewed the data as listed    Component Value Date/Time   NA 140 11/11/2022 1033   K 4.9 11/11/2022 1033   CL 104 11/11/2022 1033   CO2 22 11/11/2022 1033   GLUCOSE 117 (H) 11/11/2022 1033   BUN 45 (H) 11/11/2022 1033   CREATININE 1.39 (H) 11/11/2022 1033   CREATININE 1.47 (H) 10/14/2022 1004   CALCIUM 10.0 11/11/2022 1033   PROT 6.9 11/11/2022 1033   ALBUMIN 4.0 11/11/2022 1033   AST 14 (L) 11/11/2022 1033   AST 14 (L) 10/14/2022 1004   ALT  12 11/11/2022 1033   ALT 10 10/14/2022 1004   ALKPHOS 76 11/11/2022 1033   BILITOT 0.3 11/11/2022 1033   BILITOT 0.4 10/14/2022 1004   GFRNONAA 38 (L) 11/11/2022 1033   GFRNONAA 35 (L) 10/14/2022 1004   GFRAA 57 (L) 12/25/2012 0850    No results found for: "SPEP", "UPEP"  Lab Results  Component Value Date   WBC 8.0 11/11/2022   NEUTROABS 6.5 11/11/2022   HGB 10.3 (L) 11/11/2022   HCT 30.0 (L) 11/11/2022   MCV 103.4 (H) 11/11/2022   PLT 186 11/11/2022      Chemistry      Component Value Date/Time   NA 140 11/11/2022 1033   K 4.9 11/11/2022 1033   CL 104 11/11/2022 1033   CO2 22 11/11/2022 1033   BUN 45 (H) 11/11/2022 1033   CREATININE 1.39 (H) 11/11/2022 1033   CREATININE 1.47 (H) 10/14/2022 1004      Component Value Date/Time   CALCIUM 10.0 11/11/2022 1033   ALKPHOS 76 11/11/2022 1033   AST 14 (L) 11/11/2022  1033   AST 14 (L) 10/14/2022 1004   ALT 12 11/11/2022 1033   ALT 10 10/14/2022 1004   BILITOT 0.3 11/11/2022 1033   BILITOT 0.4 10/14/2022 1004       RADIOGRAPHIC STUDIES: I have personally reviewed the radiological images as listed and agreed with the findings in the report. CT Abdomen Pelvis Wo Contrast  Result Date: 12/11/2022 CLINICAL DATA:  Ovarian cancer restaging.  * Tracking Code: BO * EXAM: CT ABDOMEN AND PELVIS WITHOUT CONTRAST TECHNIQUE: Multidetector CT imaging of the abdomen and pelvis was performed following the standard protocol without IV contrast. RADIATION DOSE REDUCTION: This exam was performed according to the departmental dose-optimization program which includes automated exposure control, adjustment of the mA and/or kV according to patient size and/or use of iterative reconstruction technique. COMPARISON:  CT scan 03/18/2022.  Abdominal MRI 03/29/2022 FINDINGS: Lower chest: Unremarkable. Hepatobiliary: No suspicious focal abnormality in the liver on this study without intravenous contrast. Layering tiny calcified gallstones evident. No intrahepatic or extrahepatic biliary dilation. Pancreas: Parenchymal calcification again noted tail of pancreas. The cystic pancreatic head lesion seen on MRI is not well demonstrated by CT but measures approximately 10 mm on image 33/2. No main duct dilatation. Spleen: No splenomegaly. No focal mass lesion. Adrenals/Urinary Tract: No adrenal nodule or mass. Renal cortical thinning noted bilaterally with bilateral renal lesions of varying size and attenuation. 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 No followup imaging is recommended. No evidence for hydroureter. The urinary bladder appears normal for the degree of distention. Stomach/Bowel: Stomach is distended with food and contrast material. Duodenum is normally positioned as is the ligament of Treitz. No small bowel wall thickening. No small bowel dilatation. The  terminal ileum is normal. The appendix is normal. No gross colonic mass. No colonic wall thickening. Vascular/Lymphatic: There is mild atherosclerotic calcification of the abdominal aorta without aneurysm. There is no gastrohepatic or hepatoduodenal ligament lymphadenopathy. No retroperitoneal or mesenteric lymphadenopathy. No pelvic sidewall lymphadenopathy. Reproductive: Hysterectomy.  There is no adnexal mass. Other: No intraperitoneal free fluid. No omental or peritoneal nodularity evident. Small pelvic sidewall lymph nodes unchanged since 03/18/2022. Musculoskeletal: Degenerative changes noted in both hips. 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 IMPRESSION: 1. No findings in the pelvis to suggest local recurrence. No evidence for metastatic disease in the abdomen or pelvis. 2. The cystic pancreatic head lesion seen on MRI is not  well demonstrated by CT but measures approximately 10 mm. Continued attention on restaging scans recommended. Previous MRI recommended 2 year interval MRI follow-up. 3. Cholelithiasis. 4.  Aortic Atherosclerosis (ICD10-I70.0). Electronically Signed   By: Misty Stanley M.D.   On: 12/11/2022 18:01

## 2022-12-19 ENCOUNTER — Telehealth: Payer: Self-pay | Admitting: *Deleted

## 2022-12-19 NOTE — Telephone Encounter (Signed)
Spoke with the patient's daughter and schedueld a follow up appt with Dr Berline Lopes on 6/13

## 2022-12-29 ENCOUNTER — Ambulatory Visit (HOSPITAL_COMMUNITY)
Admission: RE | Admit: 2022-12-29 | Discharge: 2022-12-29 | Disposition: A | Discharge status: Home / Self Care | Payer: Medicare Other | Source: Ambulatory Visit | Attending: Hematology and Oncology | Admitting: Hematology and Oncology

## 2022-12-29 DIAGNOSIS — Z452 Encounter for adjustment and management of vascular access device: Secondary | ICD-10-CM | POA: Diagnosis present

## 2022-12-29 DIAGNOSIS — C5702 Malignant neoplasm of left fallopian tube: Secondary | ICD-10-CM | POA: Insufficient documentation

## 2022-12-29 HISTORY — PX: IR REMOVAL TUN ACCESS W/ PORT W/O FL MOD SED: IMG2290

## 2022-12-29 MED ORDER — LIDOCAINE-EPINEPHRINE 1 %-1:100000 IJ SOLN
INTRAMUSCULAR | Status: AC
  Administered 2022-12-29: 10 mL
  Filled 2022-12-29: qty 1

## 2022-12-29 NOTE — Procedures (Signed)
Interventional Radiology Procedure Note  Procedure: RT IJ POWER PORT REMOVAL    Complications: None  Estimated Blood Loss:  MIN  Findings: FULL REPORT IN PACS    M. TREVOR Amil Bouwman, MD    

## 2023-02-07 ENCOUNTER — Encounter: Payer: Self-pay | Admitting: Gynecologic Oncology

## 2023-03-16 ENCOUNTER — Encounter: Payer: Self-pay | Admitting: Gynecologic Oncology

## 2023-03-16 ENCOUNTER — Inpatient Hospital Stay: Payer: Medicare Other | Attending: Gynecologic Oncology | Admitting: Gynecologic Oncology

## 2023-03-16 VITALS — BP 131/49 | HR 58 | Temp 97.5°F | Resp 20 | Ht 63.0 in | Wt 227.0 lb

## 2023-03-16 DIAGNOSIS — Z9221 Personal history of antineoplastic chemotherapy: Secondary | ICD-10-CM | POA: Insufficient documentation

## 2023-03-16 DIAGNOSIS — Z90722 Acquired absence of ovaries, bilateral: Secondary | ICD-10-CM | POA: Diagnosis not present

## 2023-03-16 DIAGNOSIS — Z08 Encounter for follow-up examination after completed treatment for malignant neoplasm: Secondary | ICD-10-CM | POA: Insufficient documentation

## 2023-03-16 DIAGNOSIS — Z8544 Personal history of malignant neoplasm of other female genital organs: Secondary | ICD-10-CM | POA: Diagnosis present

## 2023-03-16 DIAGNOSIS — Z90710 Acquired absence of both cervix and uterus: Secondary | ICD-10-CM | POA: Diagnosis not present

## 2023-03-16 DIAGNOSIS — C5702 Malignant neoplasm of left fallopian tube: Secondary | ICD-10-CM

## 2023-03-16 NOTE — Progress Notes (Signed)
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  No significant cystic component or ascites.  Renal lesion of the right kidney lower pole is thought to represent a benign lesion.  1.3 cm cystic lesion of the head of the pancreas is recommended to be follow-up with pancreatic MRI in 2 years.   05/02/2022 Tumor Marker   Patient's tumor was tested for the following markers: CA-125. Results of the tumor marker test revealed 69.   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 Fullness appreciated within the left cul de sac. On intra-abdominal entry, normal upper abdominal survey including liver edge, diaphragm, and stomach. Normal appearing omentum. No ascites. Normal appearing small bowel. Significant inflammation within the pelvis concerning initially for recent diverticulitis versus endometriosis. Thickened and inflamed pelvic peritoneum. Filmy adhesions between the anterior cul de sac and uterus, between the right adnexa and and the sigmoid colon. Right adnexa inflammed but otherwise normal in appearing. Left fallopian tube dilated with evidence of hydrosalpinx. Friable adhesions of the fallopian tube to the sigmoid mesentery, left ovary and left broad ligament. No ascites. Uterus bulbous and 10cm with findings of endosalpingiosis versus  endometriosis on much of the uterine serosa. Dense adhesions between the bladder and cervix. Cul de sac without evidence of disease. No ascites.  Cystoscopy findings: bladder dome intact, normal efflux from bilateral ureteral orifices. On frozen section, no abnormality of the endometrium. Anterior cul de sac adhesion with findings of hemosiderin, no definitive malignancy. Fallopian tube with malignancy.  Given findings and pelvic peritoneal involvement given fallopian tube adherent to surrounding structures, no upper abdominal findings, normal omentum, and normal neuroassessment intraoperatively as well as on preoperative imaging, lymphadenectomy deferred.   05/10/2022 Pathology Results   Procedure: Hysterectomy with bilateral salpingo-oophorectomy  Specimen Integrity: Intact  Tumor Site: Left fallopian tube  Tumor Size: 6.3 x 3.8 x 2.8 cm  Histologic Type: Serous carcinoma  Histologic Grade: High-grade  Ovarian Surface Involvement: Not identified  Fallopian Tube Surface Involvement: Not identified  Implants (required for advanced stage serous/seromucinous borderline  tumors only): Not applicable  Other Tissue/ Organ Involvement: Not applicable  Largest Extrapelvic Peritoneal Focus: Not applicable  Peritoneal/Ascitic Fluid Involvement: Not identified [facility] [phone])  Chemotherapy Response Score (CRS): Not applicable, no known presurgical  therapy  Regional Lymph Nodes: Not applicable (no lymph nodes submitted or found)  Distant Metastasis: Not applicable  Pathologic Stage Classification (pTNM, AJCC 8th Edition): pT1a, pN n/a  Ancillary Studies: Available upon request  Representative Tumor Block: B15  Comment(s): An immunohistochemistry stain for p53 is performed with  adequate control and is diffusely positive within the tumor.  The same  stain shows patchy variable staining within the reactive mesothelium  lining the serosal adhesions within the anterior cul-de-sac    05/17/2022 Initial  Diagnosis   Fallopian tube cancer, carcinoma, left (HCC)   05/30/2022 Cancer Staging   Staging form: Ovary, Fallopian Tube, and Primary Peritoneal Carcinoma, AJCC  8th Edition - Pathologic stage from 05/30/2022: FIGO Stage IIB (pT2b, pN0, cM0) - Signed by Artis Delay, MD on 05/30/2022 Stage prefix: Initial diagnosis   06/10/2022 - 11/11/2022 Chemotherapy   Patient is on Treatment Plan : OVARIAN Carboplatin (AUC 6) + Paclitaxel (175) q21d X 6 Cycles     06/10/2022 Procedure   Placement of a subcutaneous power-injectable port device. Catheter tip at the superior cavoatrial junction.      Genetic Testing   Negative genetic testing. No pathogenic variants identified on the [facility] Multi-Cancer+RNA panel. The report date is 09/06/2022.   The Multi-Cancer + RNA Panel offered by [facility] includes sequencing and/or deletion/duplication analysis of the following 70 genes:  AIP*, ALK, APC*, ATM*, AXIN2*, BAP1*, BARD1*, BLM*, BMPR1A*, BRCA1*, BRCA2*, BRIP1*, CDC73*, CDH1*, CDK4, CDKN1B*, CDKN2A, CHEK2*, CTNNA1*, DICER1*, EPCAM, EGFR, FH*, FLCN*, GREM1, HOXB13, KIT, LZTR1, MAX*, MBD4, MEN1*, MET, MITF, MLH1*, MSH2*, MSH3*, MSH6*, MUTYH*, NF1*, NF2*, NTHL1*, PALB2*, PDGFRA, PMS2*, POLD1*, POLE*, POT1*, PRKAR1A*, PTCH1*, PTEN*, RAD51C*, RAD51D*, RB1*, RET, SDHA*, SDHAF2*, SDHB*, SDHC*, SDHD*, SMAD4*, SMARCA4*, SMARCB1*, SMARCE1*, STK11*, SUFU*, TMEM127*, TP53*, TSC1*, TSC2*, VHL*. RNA analysis is performed for * genes.   11/14/2022 Tumor Marker   Patient's tumor was tested for the following markers: CA-125. Results of the tumor marker test revealed 13.9.   12/12/2022 Imaging   1. No findings in the pelvis to suggest local recurrence. No evidence for metastatic disease in the abdomen or pelvis. 2. The cystic pancreatic head lesion seen on MRI is not well demonstrated by CT but measures approximately 10 mm. Continued attention on restaging scans recommended. Previous MRI recommended 2 year interval MRI follow-up. 3.  Cholelithiasis. 4.  Aortic Atherosclerosis (ICD10-I70.0).   01/02/2023 Procedure   Patient had successful removal of port     Interval History: The patient reports doing well.  She denies any abdominal or pelvic pain.  Reports baseline bowel and bladder function.  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[facility] and Cautery)    Past Surgical History:  Procedure Laterality Date   BREAST BIOPSY Left    BREAST BIOPSY Left    DILATION AND CURETTAGE OF UTERUS     DILITATION & CURRETTAGE/HYSTROSCOPY WITH VERSAPOINT RESECTION N/A 12/26/2012   Procedure: DILATATION & CURETTAGE/HYSTEROSCOPY WITH VERSAPOINT RESECTION;  Surgeon: Genia Del, MD;  Location: [facility];  Service: Gynecology;  Laterality: N/A;   IR IMAGING GUIDED PORT INSERTION  06/09/2022   IR REMOVAL TUN ACCESS W/ PORT W/O FL MOD SED  12/29/2022   JOINT REPLACEMENT     MANDIBLE FRACTURE SURGERY     ROBOTIC ASSISTED BILATERAL SALPINGO OOPHERECTOMY Bilateral 05/10/2022   Procedure: XI ROBOTIC ASSISTED BILATERAL SALPINGO OOPHORECTOMY, TOTAL HYSTERECTOMY, CYSTOSCOPY;  Surgeon: Carver Fila, MD;  Location: [facility];  Service: Gynecology;  Laterality: Bilateral;   TOTAL KNEE ARTHROPLASTY Bilateral     Family  History  Problem Relation Age of Onset   Heart disease Maternal Grandmother    Heart disease Maternal Grandfather    Stroke Maternal Grandfather     Colon cancer Maternal Grandfather        dx 48s   Breast cancer Other    Cancer Half-Brother        in spine   Ovarian cancer Neg Hx    Endometrial cancer Neg Hx    Pancreatic cancer Neg Hx    Prostate cancer Neg Hx     Social History   Socioeconomic History   Marital status: Divorced    Spouse name: Not on file   Number of children: Not on file   Years of education: Not on file   Highest education level: Not on file  Occupational History   Not on file  Tobacco Use   Smoking status: Former    Types: Cigarettes    Quit date: 1999    Years since quitting: 25.4    Passive exposure: Past   Smokeless tobacco: Never  Vaping Use   Vaping Use: Never used  Substance and Sexual Activity   Alcohol use: Yes    Comment: rare   Drug use: No   Sexual activity: Not Currently  Other Topics Concern   Not on file  Social History Narrative   ** Merged History Encounter **       Social Determinants of Health   Financial Resource Strain: Not on file  Food Insecurity: Not on file  Transportation Needs: Not on file  Physical Activity: Not on file  Stress: Not on file  Social Connections: Not on file    Current Medications:  Current Outpatient Medications:    acetaminophen (TYLENOL) 500 MG tablet, Take 500-1,000 mg by mouth every 6 (six) hours as needed (pain.)., Disp: , Rfl:    allopurinol (ZYLOPRIM) 300 MG tablet, Take 300 mg by mouth at bedtime., Disp: , Rfl:    amLODipine (NORVASC) 5 MG tablet, Take 5 mg by mouth at bedtime., Disp: , Rfl:    atenolol (TENORMIN) 50 MG tablet, Take 100 mg by mouth at bedtime., Disp: , Rfl:    atorvastatin (LIPITOR) 80 MG tablet, Take 80 mg by mouth every other day. At night, Disp: , Rfl:    cholecalciferol (VITAMIN D3) 25 MCG (1000 UNIT) tablet, Take 2,000 Units by mouth at bedtime., Disp: , Rfl:    citalopram (CELEXA) 10 MG tablet, Take 10 mg by mouth in the morning., Disp: , Rfl:    divalproex (DEPAKOTE) 250 MG DR tablet, Take 750 mg by mouth  in the morning., Disp: , Rfl:    furosemide (LASIX) 20 MG tablet, Take 40 mg by mouth in the morning., Disp: , Rfl:    glipiZIDE (GLUCOTROL) 5 MG tablet, Take 5 mg by mouth in the morning., Disp: , Rfl:    Lancets (ONETOUCH DELICA PLUS LANCET33G) MISC, Apply topically daily., Disp: , Rfl:    levothyroxine (SYNTHROID) 50 MCG tablet, Take 50 mcg by mouth daily before breakfast., Disp: , Rfl:    miconazole (BAZA ANTIFUNGAL) 2 % cream, Apply 1 Application topically 2 (two) times daily as needed (skin irritation.)., Disp: , Rfl:   Review of Systems: + Fatigue, leg swelling, frequency, incontinence, joint pain, rash, numbness, easy bruising/bleeding. Denies appetite changes, fevers, chills, unexplained weight changes. Denies hearing loss, neck lumps or masses, mouth sores, ringing in ears or voice changes. Denies cough or wheezing.  Denies shortness of breath. Denies chest pain or  palpitations.  Denies abdominal distention, pain, blood in stools, constipation, diarrhea, nausea, vomiting, or early satiety. Denies pain with intercourse, dysuria, hematuria. Denies hot flashes, pelvic pain, vaginal bleeding or vaginal discharge.   Denies back pain or muscle pain/cramps. Denies itching or wounds. Denies dizziness, headaches, or seizures. Denies swollen lymph nodes or glands. Denies anxiety, depression, confusion, or decreased concentration.  Physical Exam: BP (!) 131/49 (BP Location: Left Arm, Patient Position: Sitting)   Pulse (!) 58   Temp (!) 97.5 F (36.4 C) (Oral)   Resp 20   Ht 5\' 3"  (1.6 m)   Wt 227 lb (103 kg)   SpO2 99% Comment: ra  BMI 40.21 kg/m  General: Alert, oriented, no acute distress. HEENT: Normocephalic, atraumatic, sclera anicteric. Chest: Clear to auscultation bilaterally.  No wheezes or rhonchi. Cardiovascular: heart rate 60s, regular rhythm, no murmurs. Abdomen: Obese, soft, nontender.  Normoactive bowel sounds.  No masses or hepatosplenomegaly appreciated.   Well-healed incisions. Extremities: Grossly normal range of motion.  Warm, well perfused.  Trace edema bilaterally. Skin: No rashes or lesions noted. Lymphatics: No cervical, supraclavicular, or inguinal adenopathy. GU: Normal appearing external genitalia without erythema, excoriation, or lesions.  Speculum exam reveals mildly atrophic vaginal mucosa, no lesions, cuff intact.  Bimanual exam reveals no masses or nodularity.  Rectovaginal exam  confirms these findings.  Laboratory & Radiologic Studies: Component Ref Range & Units 4 mo ago 10 mo ago  Cancer Antigen (CA) 125 0.0 - 38.1 U/mL 13.9 69.0 High      Assessment & Plan: Sarah Bishop is a 82 y.o. 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  Doing well, NED on exam today.  Scan in March after completion of chemotherapy showed no recurrent or metastatic disease.  CA125 will be drawn next week.  Per NCCN surveillance recommendations, we will continue with visits every 2-4 months.  The patient is scheduled to see Dr. Bertis Ruddy in September.  I will see her for follow-up in December.  We reviewed signs and symptoms that should prompt a phone call before her next visit.  20 minutes of total time was spent for this patient encounter, including preparation, face-to-face counseling with the patient and coordination of care, and documentation of the encounter.  Eugene Garnet, MD  Division of Gynecologic Oncology  Department of Obstetrics and Gynecology  [facility]

## 2023-03-16 NOTE — Patient Instructions (Signed)
It was good to see you today.  I do not see or feel any evidence of cancer recurrence on your exam.  I will see you for follow-up in 6 months.  As always, if you develop any new and concerning symptoms before your next visit, please call to see me sooner.   

## 2023-03-21 ENCOUNTER — Inpatient Hospital Stay: Payer: Medicare Other

## 2023-03-21 ENCOUNTER — Other Ambulatory Visit: Payer: Self-pay

## 2023-03-21 DIAGNOSIS — C5702 Malignant neoplasm of left fallopian tube: Secondary | ICD-10-CM

## 2023-03-21 DIAGNOSIS — Z08 Encounter for follow-up examination after completed treatment for malignant neoplasm: Secondary | ICD-10-CM | POA: Diagnosis not present

## 2023-03-21 LAB — CBC WITH DIFFERENTIAL/PLATELET
Abs Immature Granulocytes: 0.09 10*3/uL — ABNORMAL HIGH (ref 0.00–0.07)
Basophils Absolute: 0 10*3/uL (ref 0.0–0.1)
Basophils Relative: 1 %
Eosinophils Absolute: 0.2 10*3/uL (ref 0.0–0.5)
Eosinophils Relative: 2 %
HCT: 33.4 % — ABNORMAL LOW (ref 36.0–46.0)
Hemoglobin: 10.8 g/dL — ABNORMAL LOW (ref 12.0–15.0)
Immature Granulocytes: 1 %
Lymphocytes Relative: 30 %
Lymphs Abs: 2.6 10*3/uL (ref 0.7–4.0)
MCH: 32.5 pg (ref 26.0–34.0)
MCHC: 32.3 g/dL (ref 30.0–36.0)
MCV: 100.6 fL — ABNORMAL HIGH (ref 80.0–100.0)
Monocytes Absolute: 0.8 10*3/uL (ref 0.1–1.0)
Monocytes Relative: 9 %
Neutro Abs: 4.9 10*3/uL (ref 1.7–7.7)
Neutrophils Relative %: 57 %
Platelets: 158 10*3/uL (ref 150–400)
RBC: 3.32 MIL/uL — ABNORMAL LOW (ref 3.87–5.11)
RDW: 14.4 % (ref 11.5–15.5)
WBC: 8.6 10*3/uL (ref 4.0–10.5)
nRBC: 0 % (ref 0.0–0.2)

## 2023-03-21 LAB — COMPREHENSIVE METABOLIC PANEL
ALT: 10 U/L (ref 0–44)
AST: 12 U/L — ABNORMAL LOW (ref 15–41)
Albumin: 3.8 g/dL (ref 3.5–5.0)
Alkaline Phosphatase: 72 U/L (ref 38–126)
Anion gap: 6 (ref 5–15)
BUN: 48 mg/dL — ABNORMAL HIGH (ref 8–23)
CO2: 32 mmol/L (ref 22–32)
Calcium: 9.8 mg/dL (ref 8.9–10.3)
Chloride: 105 mmol/L (ref 98–111)
Creatinine, Ser: 1.19 mg/dL — ABNORMAL HIGH (ref 0.44–1.00)
GFR, Estimated: 45 mL/min — ABNORMAL LOW (ref >60)
Glucose, Bld: 112 mg/dL — ABNORMAL HIGH (ref 70–99)
Potassium: 4.6 mmol/L (ref 3.5–5.1)
Sodium: 143 mmol/L (ref 135–145)
Total Bilirubin: 0.3 mg/dL (ref 0.3–1.2)
Total Protein: 6.3 g/dL — ABNORMAL LOW (ref 6.5–8.1)

## 2023-03-23 ENCOUNTER — Telehealth: Payer: Self-pay

## 2023-03-23 LAB — CA 125: Cancer Antigen (CA) 125: 12.1 U/mL (ref 0.0–38.1)

## 2023-03-23 NOTE — Telephone Encounter (Signed)
Called to given below message. Daughter, [name] answered the phone. Given below message, she verbalized understanding.

## 2023-03-23 NOTE — Telephone Encounter (Signed)
-----   Message from Sarah Delay, MD sent at 03/23/2023  8:16 AM EDT ----- Dr. Pricilla Holm ordered labs to be done but lab ordered labs under my name All labs are better CA-125 is ok Let her know

## 2023-04-24 ENCOUNTER — Telehealth: Payer: Self-pay

## 2023-04-24 NOTE — Telephone Encounter (Signed)
Called and spoke with daughter, [name]. Given radiology scheduling #. She will call to schedule CT.

## 2023-04-24 NOTE — Telephone Encounter (Signed)
-----   Message from Artis Delay sent at 04/24/2023  7:51 AM EDT ----- Pls remind her to call and scehdule CT for 9/10

## 2023-06-06 DIAGNOSIS — H903 Sensorineural hearing loss, bilateral: Secondary | ICD-10-CM | POA: Insufficient documentation

## 2023-06-06 DIAGNOSIS — H93A2 Pulsatile tinnitus, left ear: Secondary | ICD-10-CM | POA: Insufficient documentation

## 2023-06-13 ENCOUNTER — Inpatient Hospital Stay: Payer: Medicare Other | Attending: Gynecologic Oncology

## 2023-06-13 ENCOUNTER — Ambulatory Visit (HOSPITAL_COMMUNITY)
Admission: RE | Admit: 2023-06-13 | Discharge: 2023-06-13 | Disposition: A | Discharge status: Home / Self Care | Payer: Medicare Other | Source: Ambulatory Visit | Attending: Hematology and Oncology | Admitting: Hematology and Oncology

## 2023-06-13 DIAGNOSIS — K862 Cyst of pancreas: Secondary | ICD-10-CM | POA: Insufficient documentation

## 2023-06-13 DIAGNOSIS — C5702 Malignant neoplasm of left fallopian tube: Secondary | ICD-10-CM | POA: Insufficient documentation

## 2023-06-13 DIAGNOSIS — N183 Chronic kidney disease, stage 3 unspecified: Secondary | ICD-10-CM | POA: Insufficient documentation

## 2023-06-13 DIAGNOSIS — Z79899 Other long term (current) drug therapy: Secondary | ICD-10-CM | POA: Insufficient documentation

## 2023-06-13 DIAGNOSIS — Z6841 Body Mass Index (BMI) 40.0 and over, adult: Secondary | ICD-10-CM | POA: Insufficient documentation

## 2023-06-13 DIAGNOSIS — D638 Anemia in other chronic diseases classified elsewhere: Secondary | ICD-10-CM | POA: Insufficient documentation

## 2023-06-13 LAB — CBC WITH DIFFERENTIAL/PLATELET
Abs Immature Granulocytes: 0.12 10*3/uL — ABNORMAL HIGH (ref 0.00–0.07)
Basophils Absolute: 0.1 10*3/uL (ref 0.0–0.1)
Basophils Relative: 1 %
Eosinophils Absolute: 0.4 10*3/uL (ref 0.0–0.5)
Eosinophils Relative: 4 %
HCT: 33.6 % — ABNORMAL LOW (ref 36.0–46.0)
Hemoglobin: 10.9 g/dL — ABNORMAL LOW (ref 12.0–15.0)
Immature Granulocytes: 1 %
Lymphocytes Relative: 30 %
Lymphs Abs: 2.8 10*3/uL (ref 0.7–4.0)
MCH: 31.7 pg (ref 26.0–34.0)
MCHC: 32.4 g/dL (ref 30.0–36.0)
MCV: 97.7 fL (ref 80.0–100.0)
Monocytes Absolute: 0.8 10*3/uL (ref 0.1–1.0)
Monocytes Relative: 9 %
Neutro Abs: 5.1 10*3/uL (ref 1.7–7.7)
Neutrophils Relative %: 55 %
Platelets: 156 10*3/uL (ref 150–400)
RBC: 3.44 MIL/uL — ABNORMAL LOW (ref 3.87–5.11)
RDW: 14.9 % (ref 11.5–15.5)
WBC: 9.2 10*3/uL (ref 4.0–10.5)
nRBC: 0 % (ref 0.0–0.2)

## 2023-06-13 LAB — COMPREHENSIVE METABOLIC PANEL
ALT: 10 U/L (ref 0–44)
AST: 12 U/L — ABNORMAL LOW (ref 15–41)
Albumin: 4.1 g/dL (ref 3.5–5.0)
Alkaline Phosphatase: 60 U/L (ref 38–126)
Anion gap: 7 (ref 5–15)
BUN: 49 mg/dL — ABNORMAL HIGH (ref 8–23)
CO2: 28 mmol/L (ref 22–32)
Calcium: 9.7 mg/dL (ref 8.9–10.3)
Chloride: 105 mmol/L (ref 98–111)
Creatinine, Ser: 1.42 mg/dL — ABNORMAL HIGH (ref 0.44–1.00)
GFR, Estimated: 37 mL/min — ABNORMAL LOW (ref >60)
Glucose, Bld: 119 mg/dL — ABNORMAL HIGH (ref 70–99)
Potassium: 4.7 mmol/L (ref 3.5–5.1)
Sodium: 140 mmol/L (ref 135–145)
Total Bilirubin: 0.4 mg/dL (ref 0.3–1.2)
Total Protein: 6.9 g/dL (ref 6.5–8.1)

## 2023-06-13 MED ORDER — BARIUM SULFATE 2 % PO SUSP
900.0000 mL | Freq: Once | ORAL | Status: AC
  Administered 2023-06-13: 900 mL via ORAL

## 2023-06-14 LAB — CA 125: Cancer Antigen (CA) 125: 14.1 U/mL (ref 0.0–38.1)

## 2023-06-15 ENCOUNTER — Inpatient Hospital Stay: Payer: Medicare Other | Admitting: Hematology and Oncology

## 2023-06-15 ENCOUNTER — Encounter: Payer: Self-pay | Admitting: Hematology and Oncology

## 2023-06-15 VITALS — BP 133/48 | HR 59 | Temp 98.7°F | Resp 18 | Ht 63.0 in | Wt 236.4 lb

## 2023-06-15 DIAGNOSIS — N183 Chronic kidney disease, stage 3 unspecified: Secondary | ICD-10-CM

## 2023-06-15 DIAGNOSIS — D638 Anemia in other chronic diseases classified elsewhere: Secondary | ICD-10-CM

## 2023-06-15 DIAGNOSIS — K862 Cyst of pancreas: Secondary | ICD-10-CM | POA: Diagnosis not present

## 2023-06-15 DIAGNOSIS — C5702 Malignant neoplasm of left fallopian tube: Secondary | ICD-10-CM | POA: Diagnosis present

## 2023-06-15 DIAGNOSIS — Z79899 Other long term (current) drug therapy: Secondary | ICD-10-CM | POA: Diagnosis not present

## 2023-06-15 DIAGNOSIS — E66813 Obesity, class 3: Secondary | ICD-10-CM

## 2023-06-15 DIAGNOSIS — Z6841 Body Mass Index (BMI) 40.0 and over, adult: Secondary | ICD-10-CM | POA: Diagnosis not present

## 2023-06-15 NOTE — Assessment & Plan Note (Signed)
We discussed risk factors for worsening kidney failure Due to history of allergy to IV  contrast, I would not prescribe CT imaging with contrast

## 2023-06-15 NOTE — Assessment & Plan Note (Signed)
She has gained a lot of weight since last time I saw her due to sedentary lifestyle and poor dietary choices We discussed risk of association between obesity and cancer recurrence I motivated the patient to join a local gym and to start exercise

## 2023-06-15 NOTE — Assessment & Plan Note (Signed)
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

## 2023-06-15 NOTE — Assessment & Plan Note (Signed)
I reviewed CT imaging with her daughter She has no signs of cancer She will follow-up with GYN surgeon in 3 months I plan to repeat imaging study in 1 year We discussed signs and symptoms to watch out for cancer recurrence

## 2023-06-15 NOTE — Progress Notes (Signed)
[facility] OFFICE PROGRESS NOTE  Patient Care Team: System, Provider Not In as PCP - General Janalyn Harder, MD (Inactive) as Consulting Physician (Dermatology) Janalyn Harder, MD (Inactive) as Consulting Physician (Dermatology)  ASSESSMENT & PLAN:  Fallopian tube cancer, carcinoma, left [name]) I reviewed CT imaging with her daughter She has no signs of cancer She will follow-up with GYN surgeon in 3 months I plan to repeat imaging study in 1 year We discussed signs and symptoms to watch out for cancer recurrence  CKD (chronic kidney disease), stage III (HCC) We discussed risk factors for worsening kidney failure Due to history of allergy to IV  contrast, I would not prescribe CT imaging with contrast  Anemia, chronic disease This is likely anemia of chronic disease. The patient denies recent history of bleeding such as epistaxis, hematuria or hematochezia. She is asymptomatic from the anemia. We will observe for now.  She does not require transfusion now. I do not recommend any further work-up at this time.    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    Answer:   [facility]    Order Specific Question:   If indicated for the ordered procedure, I authorize the administration of oral contrast media per Radiology protocol    Answer:   No    Order Specific Question:   Reason for no oral  contrast:    Answer:   no need oral contrast    All questions were answered. The patient knows to call the clinic with any problems, questions or concerns. The total time spent in the appointment was 40 minutes encounter with patients including review of chart and various tests results, discussions about plan of care and coordination of care plan   Artis Delay, MD 06/15/2023 10:28 AM  INTERVAL HISTORY: Please see below for problem oriented charting. she returns for surveillance follow-up for history of fallopian tube carcinoma She is doing well She denies abdominal pain or changes in bowel habits Review test results including tumor marker as well as CT imaging I noticed the patient has gained a lot of weight over the last few months According to her family, she has not been exercising and had poor dietary choices  REVIEW OF SYSTEMS:   Constitutional: Denies fevers, chills or abnormal weight loss Eyes: Denies blurriness of vision Ears, nose, mouth, throat, and face: Denies mucositis or sore throat Respiratory: Denies cough, dyspnea or wheezes Cardiovascular: Denies palpitation, chest discomfort or lower extremity swelling Gastrointestinal:  Denies nausea, heartburn or change in bowel habits Skin: Denies abnormal skin rashes Lymphatics: Denies new lymphadenopathy or easy bruising Neurological:Denies numbness, tingling or new weaknesses Behavioral/Psych: Mood is stable, no new changes  All other systems were reviewed with the patient and are negative.  I have reviewed the past medical history, past surgical history, social history and family history with the  patient and they are unchanged from previous note.  ALLERGIES:  is allergic to augmentin [amoxicillin-pot clavulanate] and iodinated contrast media.  MEDICATIONS:  Current Outpatient Medications  Medication Sig Dispense Refill   acetaminophen (TYLENOL) 500 MG tablet Take 500-1,000 mg by mouth every 6 (six) hours as needed  (pain.).     allopurinol (ZYLOPRIM) 300 MG tablet Take 300 mg by mouth at bedtime.     amLODipine (NORVASC) 5 MG tablet Take 5 mg by mouth at bedtime.     atenolol (TENORMIN) 50 MG tablet Take 100 mg by mouth at bedtime.     atorvastatin (LIPITOR) 80 MG tablet Take 80 mg by mouth every other day. At night     cholecalciferol (VITAMIN D3) 25 MCG (1000 UNIT) tablet Take 2,000 Units by mouth at bedtime.     citalopram (CELEXA) 10 MG tablet Take 10 mg by mouth in the morning.     divalproex (DEPAKOTE) 250 MG DR tablet Take 750 mg by mouth in the morning.     furosemide (LASIX) 20 MG tablet Take 40 mg by mouth in the morning.     glipiZIDE (GLUCOTROL) 5 MG tablet Take 5 mg by mouth in the morning.     Lancets (ONETOUCH DELICA PLUS LANCET33G) MISC Apply topically daily.     levothyroxine (SYNTHROID) 50 MCG tablet Take 50 mcg by mouth daily before breakfast.     miconazole (BAZA ANTIFUNGAL) 2 % cream Apply 1 Application topically 2 (two) times daily as needed (skin irritation.).     No current facility-administered medications for this visit.    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  No significant cystic component or ascites.  Renal lesion of the right kidney lower pole is thought to represent a benign lesion.  1.3 cm cystic lesion of the head of the pancreas is recommended to be follow-up with pancreatic MRI in 2 years.   05/02/2022 Tumor Marker   Patient's tumor was tested for the following markers: CA-125. Results of the tumor marker test revealed 69.   05/10/2022 Surgery   Robotic-assisted laparoscopic total  hysterectomy with bilateral salpingo-oophorectomy, lysis of adhesions, cystoscopy  At least stage IIB carcinoma of the fallopian tube, suspected   Findings:  On EUA, mildly enlarged, somewhat mobile uterus. Fullness appreciated within the left cul de sac. On intra-abdominal entry, normal upper abdominal survey including liver edge, diaphragm, and stomach. Normal appearing omentum. No ascites. Normal appearing small bowel. Significant inflammation within the pelvis concerning initially for recent diverticulitis versus endometriosis. Thickened and inflamed pelvic peritoneum. Filmy adhesions between the anterior cul de sac and uterus, between the right adnexa and and the sigmoid colon. Right adnexa inflammed but otherwise normal in appearing. Left fallopian tube dilated with evidence of hydrosalpinx. Friable adhesions of the fallopian tube to the sigmoid mesentery, left ovary and left broad ligament. No ascites. Uterus bulbous and 10cm with findings of endosalpingiosis versus endometriosis on much of the uterine serosa. Dense adhesions between the bladder and cervix. Cul de sac without evidence of disease. No ascites.  Cystoscopy findings: bladder dome intact, normal efflux from bilateral ureteral orifices. On frozen section, no abnormality of the  endometrium. Anterior cul de sac adhesion with findings of hemosiderin, no definitive malignancy. Fallopian tube with malignancy.  Given findings and pelvic peritoneal involvement given fallopian tube adherent to surrounding structures, no upper abdominal findings, normal omentum, and normal neuroassessment intraoperatively as well as on preoperative imaging, lymphadenectomy deferred.   05/10/2022 Pathology Results   Procedure: Hysterectomy with bilateral salpingo-oophorectomy  Specimen Integrity: Intact  Tumor Site: Left fallopian tube  Tumor Size: 6.3 x 3.8 x 2.8 cm  Histologic Type: Serous carcinoma  Histologic Grade: High-grade  Ovarian Surface Involvement:  Not identified  Fallopian Tube Surface Involvement: Not identified  Implants (required for advanced stage serous/seromucinous borderline  tumors only): Not applicable  Other Tissue/ Organ Involvement: Not applicable  Largest Extrapelvic Peritoneal Focus: Not applicable  Peritoneal/Ascitic Fluid Involvement: Not identified [facility] [phone]  Chemotherapy Response Score (CRS): Not applicable, no known presurgical  therapy  Regional Lymph Nodes: Not applicable (no lymph nodes submitted or found)  Distant Metastasis: Not applicable  Pathologic Stage Classification (pTNM, AJCC 8th Edition): pT1a, pN n/a  Ancillary Studies: Available upon request  Representative Tumor Block: B15  Comment(s): An immunohistochemistry stain for p53 is performed with  adequate control and is diffusely positive within the tumor.  The same  stain shows patchy variable staining within the reactive mesothelium  lining the serosal adhesions within the anterior cul-de-sac    05/17/2022 Initial Diagnosis   Fallopian tube cancer, carcinoma, left (HCC)   05/30/2022 Cancer Staging   Staging form: Ovary, Fallopian Tube, and Primary Peritoneal Carcinoma, AJCC 8th Edition - Pathologic stage from 05/30/2022: FIGO Stage IIB (pT2b, pN0, cM0) - Signed by Artis Delay, MD on 05/30/2022 Stage prefix: Initial diagnosis   06/10/2022 - 11/11/2022 Chemotherapy   Patient is on Treatment Plan : OVARIAN Carboplatin (AUC 6) + Paclitaxel (175) q21d X 6 Cycles     06/10/2022 Procedure   Placement of a subcutaneous power-injectable port device. Catheter tip at the superior cavoatrial junction.      Genetic Testing   Negative genetic testing. No pathogenic variants identified on the [facility] Multi-Cancer+RNA panel. The report date is 09/06/2022.   The Multi-Cancer + RNA Panel offered by [facility] includes sequencing and/or deletion/duplication analysis of the following 70 genes:  AIP*, ALK, APC*, ATM*, AXIN2*, BAP1*, BARD1*, BLM*, BMPR1A*, BRCA1*, BRCA2*,  BRIP1*, CDC73*, CDH1*, CDK4, CDKN1B*, CDKN2A, CHEK2*, CTNNA1*, DICER1*, EPCAM, EGFR, FH*, FLCN*, GREM1, HOXB13, KIT, LZTR1, MAX*, MBD4, MEN1*, MET, MITF, MLH1*, MSH2*, MSH3*, MSH6*, MUTYH*, NF1*, NF2*, NTHL1*, PALB2*, PDGFRA, PMS2*, POLD1*, POLE*, POT1*, PRKAR1A*, PTCH1*, PTEN*, RAD51C*, RAD51D*, RB1*, RET, SDHA*, SDHAF2*, SDHB*, SDHC*, SDHD*, SMAD4*, SMARCA4*, SMARCB1*, SMARCE1*, STK11*, SUFU*, TMEM127*, TP53*, TSC1*, TSC2*, VHL*. RNA analysis is performed for * genes.   11/14/2022 Tumor Marker   Patient's tumor was tested for the following markers: CA-125. Results of the tumor marker test revealed 13.9.   12/12/2022 Imaging   1. No findings in the pelvis to suggest local recurrence. No evidence for metastatic disease in the abdomen or pelvis. 2. The cystic pancreatic head lesion seen on MRI is not well demonstrated by CT but measures approximately 10 mm. Continued attention on restaging scans recommended. Previous MRI recommended 2 year interval MRI follow-up. 3. Cholelithiasis. 4.  Aortic Atherosclerosis (ICD10-I70.0).   01/02/2023 Procedure   Patient had successful removal of port    03/23/2023 Tumor Marker   Patient's tumor was tested for the following markers: CA-125. Results of the tumor marker test revealed 12.1.   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 Restaging assessment. * Tracking Code: BO * EXAM: CT ABDOMEN AND PELVIS WITHOUT CONTRAST TECHNIQUE: Multidetector CT imaging of the abdomen and pelvis was performed following the standard protocol without IV contrast. RADIATION DOSE REDUCTION: This exam was performed according to the departmental dose-optimization program which includes automated exposure control, adjustment of the mA and/or kV according to patient size and/or use of iterative reconstruction technique. COMPARISON:  12/09/2022  FINDINGS: Lower chest: Coronary and descending thoracic aortic atheromatous vascular disease. Mitral valve calcification. Hepatobiliary: Small depending gallstones in the gallbladder. No biliary dilatation. Otherwise unremarkable. Pancreas: The cystic lesion along the uncinate process previously seen on MRI is very poorly appreciable on CT today but appears roughly stable at about 0.9 cm on image 34 series 2. Spleen: Unremarkable Adrenals/Urinary Tract: Cysts of varying complexity as shown on MRI from 03/29/2022, these have been previously shown to be benign and require no further workup. Bilateral renal atrophy. Adrenal glands normal. Urinary bladder empty. Stomach/Bowel: Sigmoid colon diverticulosis. Chronic mild prominence of the appendix especially centrally or the appendiceal diameter is up to 0.9 cm on image 50 of series 2, this is been present at least since 07/13/2004 and is presumably incidental given the long-chronicity. Vascular/Lymphatic: Atherosclerosis is present, including aortoiliac atherosclerotic disease. Atheromatous vascular calcification proximally in the superior mesenteric artery noted. No pathologic adenopathy. Reproductive: Uterus absent.  Adnexa unremarkable. Other: No supplemental non-categorized findings. Musculoskeletal: Lumbar spondylosis and degenerative disc disease causing mild multilevel impingement. 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 IMPRESSION: 1. No findings of active malignancy. 2. Aortic atherosclerosis. 3. Cholelithiasis. 4. Sigmoid colon diverticulosis. 5. Chronic mild prominence of the appendix especially centrally, this has been present at least since 07/13/2004 and is presumably incidental given the long-chronicity. 6. 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 7. Lumbar spondylosis and degenerative disc disease causing mild multilevel impingement. 8. The cystic lesion along the uncinate process of the  pancreas previously seen on MRI is very poorly appreciable on CT today but appears roughly stable at about 0.9 cm. Aortic Atherosclerosis (ICD10-I70.0). Electronically Signed   By: Gaylyn Rong M.D.   On: 06/14/2023 16:12        PHYSICAL EXAMINATION: ECOG PERFORMANCE STATUS: 2 - Symptomatic, <50% confined to bed  GENERAL:alert, no distress and comfortable LABORATORY DATA:  I have reviewed the data as listed    Component Value Date/Time   NA 140 06/13/2023 0952   K 4.7 06/13/2023 0952   CL 105 06/13/2023 0952   CO2 28 06/13/2023 0952   GLUCOSE 119 (H) 06/13/2023 0952   BUN 49 (H) 06/13/2023 0952   CREATININE 1.42 (H) 06/13/2023 0952   CREATININE 1.47 (H) 10/14/2022 1004   CALCIUM 9.7 06/13/2023 0952   PROT 6.9 06/13/2023 0952   ALBUMIN 4.1 06/13/2023 0952   AST 12 (L) 06/13/2023 0952   AST 14 (L) 10/14/2022 1004   ALT 10 06/13/2023 0952   ALT 10 10/14/2022 1004   ALKPHOS 60 06/13/2023 0952   BILITOT 0.4 06/13/2023 0952   BILITOT 0.4 10/14/2022 1004   GFRNONAA 37 (L) 06/13/2023 0952   GFRNONAA 35 (L) 10/14/2022 1004   GFRAA 57 (L) 12/25/2012 0850    No results found for: "SPEP", "UPEP"  Lab Results  Component Value Date   WBC 9.2 06/13/2023   NEUTROABS 5.1 06/13/2023   HGB 10.9 (L) 06/13/2023   HCT 33.6 (L)  06/13/2023   MCV 97.7 06/13/2023   PLT 156 06/13/2023      Chemistry      Component Value Date/Time   NA 140 06/13/2023 0952   K 4.7 06/13/2023 0952   CL 105 06/13/2023 0952   CO2 28 06/13/2023 0952   BUN 49 (H) 06/13/2023 0952   CREATININE 1.42 (H) 06/13/2023 0952   CREATININE 1.47 (H) 10/14/2022 1004      Component Value Date/Time   CALCIUM 9.7 06/13/2023 0952   ALKPHOS 60 06/13/2023 0952   AST 12 (L) 06/13/2023 0952   AST 14 (L) 10/14/2022 1004   ALT 10 06/13/2023 0952   ALT 10 10/14/2022 1004   BILITOT 0.4 06/13/2023 0952   BILITOT 0.4 10/14/2022 1004       RADIOGRAPHIC STUDIES: I have reviewed multiple imaging studies with the  patient I have personally reviewed the radiological images as listed and agreed with the findings in the report. 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 Restaging assessment. * Tracking Code: BO * EXAM: CT ABDOMEN AND PELVIS WITHOUT CONTRAST TECHNIQUE: Multidetector CT imaging of the abdomen and pelvis was performed following the standard protocol without IV contrast. RADIATION DOSE REDUCTION: This exam was performed according to the departmental dose-optimization program which includes automated exposure control, adjustment of the mA and/or kV according to patient size and/or use of iterative reconstruction technique. COMPARISON:  12/09/2022 FINDINGS: Lower chest: Coronary and descending thoracic aortic atheromatous vascular disease. Mitral valve calcification. Hepatobiliary: Small depending gallstones in the gallbladder. No biliary dilatation. Otherwise unremarkable. Pancreas: The cystic lesion along the uncinate process previously seen on MRI is very poorly appreciable on CT today but appears roughly stable at about 0.9 cm on image 34 series 2. Spleen: Unremarkable Adrenals/Urinary Tract: Cysts of varying complexity as shown on MRI from 03/29/2022, these have been previously shown to be benign and require no further workup. Bilateral renal atrophy. Adrenal glands normal. Urinary bladder empty. Stomach/Bowel: Sigmoid colon diverticulosis. Chronic mild prominence of the appendix especially centrally or the appendiceal diameter is up to 0.9 cm on image 50 of series 2, this is been present at least since 07/13/2004 and is presumably incidental given the long-chronicity. Vascular/Lymphatic: Atherosclerosis is present, including aortoiliac atherosclerotic disease. Atheromatous vascular calcification proximally in the superior mesenteric artery noted. No pathologic adenopathy. Reproductive:  Uterus absent.  Adnexa unremarkable. Other: No supplemental non-categorized findings. Musculoskeletal: Lumbar spondylosis and degenerative disc disease causing mild multilevel impingement. 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 IMPRESSION: 1. No findings of active malignancy. 2. Aortic atherosclerosis. 3. Cholelithiasis. 4. Sigmoid colon diverticulosis. 5. Chronic mild prominence of the appendix especially centrally, this has been present at least since 07/13/2004 and is presumably incidental given the long-chronicity. 6. 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 7. Lumbar spondylosis and degenerative disc disease causing mild multilevel impingement. 8. The cystic lesion along the uncinate process of the pancreas previously seen on MRI is very poorly appreciable on CT today but appears roughly stable at about 0.9 cm. Aortic Atherosclerosis (ICD10-I70.0). Electronically Signed   By: Gaylyn Rong M.D.   On: 06/14/2023 16:12

## 2023-06-15 NOTE — Assessment & Plan Note (Signed)
This is likely anemia of chronic disease. The patient denies recent history of bleeding such as epistaxis, hematuria or hematochezia. She is asymptomatic from the anemia. We will observe for now.  She does not require transfusion now. I do not recommend any further work-up at this time.   

## 2023-07-11 ENCOUNTER — Ambulatory Visit (INDEPENDENT_AMBULATORY_CARE_PROVIDER_SITE_OTHER): Payer: Medicare Other

## 2023-07-11 VITALS — BP 126/62 | HR 72 | Temp 96.0°F | Resp 18 | Ht 61.0 in | Wt 235.8 lb

## 2023-07-11 DIAGNOSIS — Z23 Encounter for immunization: Secondary | ICD-10-CM | POA: Diagnosis not present

## 2023-07-11 DIAGNOSIS — N1831 Chronic kidney disease, stage 3a: Secondary | ICD-10-CM | POA: Diagnosis not present

## 2023-07-11 DIAGNOSIS — E118 Type 2 diabetes mellitus with unspecified complications: Secondary | ICD-10-CM | POA: Diagnosis not present

## 2023-07-11 DIAGNOSIS — D638 Anemia in other chronic diseases classified elsewhere: Secondary | ICD-10-CM

## 2023-07-11 DIAGNOSIS — C5702 Malignant neoplasm of left fallopian tube: Secondary | ICD-10-CM | POA: Diagnosis not present

## 2023-07-11 DIAGNOSIS — Z87898 Personal history of other specified conditions: Secondary | ICD-10-CM

## 2023-07-11 MED ORDER — ALLOPURINOL 300 MG PO TABS: 300.0000 mg | ORAL_TABLET | Freq: Every day | ORAL | 0 refills | Status: DC

## 2023-07-11 MED ORDER — EMPAGLIFLOZIN 10 MG PO TABS: 10.0000 mg | ORAL_TABLET | Freq: Every day | ORAL | 1 refills | Status: DC

## 2023-07-11 NOTE — Patient Instructions (Addendum)
Referral to neurology to discuss tapering the depakote.  [email or web address] If you chose to, you could take HALF A TABLET OF ALLOPURINOL and see how it goes. Will do blood work along with TSH at the next visit in 2 months Please use tight socks for compression, elevate your legs when resting Make appts for eye exam and dental exam Flu shot. Covid shot today CUT DOWN FUROSEMIDE TO ONE TABLET ONLY IN THE MORNING 2 months follow up

## 2023-07-11 NOTE — Progress Notes (Unsigned)
Subjective:  Patient ID: Sarah Bishop, female    DOB: 1940-11-20  Age: 82 y.o. MRN: [id number]    HPI  Sarah Bishop is an 82 y.o. female who comes in to establish care.     Sarah Bishop is accompanied by her daughter.   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 Follows up with gynecological oncologist   Had blood when wiping which was vaginal and had work up done which showed the cancer. Sarah Bishop had the surgery followed by chemo. Done with chemo as of Feb 2024.  Was diagnosed with epilepsy at age 48. Has not had a seizure since 1994.  Last gout flare was years ago. Has been consistent with her allopurinol intake.         07/11/2023    2:02 PM 07/11/2023    1:58 PM  Depression screen PHQ 2/9  Decreased Interest 0 0  Down, Depressed, Hopeless 0 0  PHQ - 2 Score 0 0        07/11/2023    1:58 PM  Fall Risk   Falls in the past year? 1  Number falls in past yr: 1  Risk for fall due to : History of fall(s)  Follow up Falls evaluation completed;Falls prevention discussed    Patient Care Team: Windell Moment, MD as PCP - General (Family Medicine) Janalyn Harder, MD (Inactive) as Consulting Physician (Dermatology) Janalyn Harder, MD (Inactive) as Consulting Physician (Dermatology)   Review of Systems  Constitutional:  Negative for chills, fatigue and fever.  HENT:  Negative for congestion, rhinorrhea and sore throat.   Respiratory:  Negative for cough and shortness of breath.   Cardiovascular:  Positive for leg swelling. Negative for chest pain.  Gastrointestinal:  Negative for abdominal pain, constipation, diarrhea, nausea and vomiting.  Genitourinary:  Negative for dysuria and urgency.  Musculoskeletal:  Negative for back pain and myalgias.  Neurological:  Negative for dizziness, weakness, light-headedness and headaches.  Psychiatric/Behavioral:  Negative for dysphoric mood. The patient is not nervous/anxious.     Current Outpatient Medications on File Prior to  Visit  Medication Sig Dispense Refill   acetaminophen (TYLENOL) 500 MG tablet Take 500-1,000 mg by mouth every 6 (six) hours as needed (pain.).     amLODipine (NORVASC) 5 MG tablet Take 5 mg by mouth at bedtime.     atenolol (TENORMIN) 50 MG tablet Take 100 mg by mouth at bedtime.     atorvastatin (LIPITOR) 80 MG tablet Take 80 mg by mouth every other day. At night     cholecalciferol (VITAMIN D3) 25 MCG (1000 UNIT) tablet Take 2,000 Units by mouth at bedtime.     citalopram (CELEXA) 10 MG tablet Take 10 mg by mouth in the morning.     divalproex (DEPAKOTE) 250 MG DR tablet Take 750 mg by mouth in the morning.     furosemide (LASIX) 20 MG tablet Take 40 mg by mouth in the morning.     glipiZIDE (GLUCOTROL) 5 MG tablet Take 5 mg by mouth in the morning.     Lancets (ONETOUCH DELICA PLUS LANCET33G) MISC Apply topically daily.     levothyroxine (SYNTHROID) 50 MCG tablet Take 50 mcg by mouth daily before breakfast.     miconazole (BAZA ANTIFUNGAL) 2 % cream Apply 1 Application topically 2 (two) times daily as needed (skin irritation.).     No current facility-administered medications on file prior to visit.   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[facility])   Past Surgical History:  Procedure Laterality Date   BREAST BIOPSY Left    BREAST BIOPSY Left    DILATION AND CURETTAGE OF UTERUS     DILITATION & CURRETTAGE/HYSTROSCOPY WITH  VERSAPOINT RESECTION N/A 12/26/2012   Procedure: DILATATION & CURETTAGE/HYSTEROSCOPY WITH VERSAPOINT RESECTION;  Surgeon: Genia Del, MD;  Location: [facility];  Service: Gynecology;  Laterality: N/A;   IR IMAGING GUIDED PORT INSERTION  06/09/2022   IR REMOVAL TUN ACCESS W/ PORT W/O FL MOD SED  12/29/2022   JOINT REPLACEMENT     MANDIBLE FRACTURE SURGERY     ROBOTIC ASSISTED BILATERAL SALPINGO OOPHERECTOMY Bilateral 05/10/2022   Procedure: XI ROBOTIC ASSISTED BILATERAL SALPINGO OOPHORECTOMY, TOTAL HYSTERECTOMY, CYSTOSCOPY;  Surgeon: Carver Fila, MD;  Location: [facility];  Service: Gynecology;  Laterality: Bilateral;   TOTAL KNEE ARTHROPLASTY Bilateral     Family History  Problem Relation Age of Onset   Heart disease Maternal Grandmother    Heart disease Maternal Grandfather    Stroke Maternal Grandfather    Colon cancer Maternal Grandfather        dx 58s   Breast cancer Other    Cancer Half-Brother        in spine   Ovarian cancer Neg Hx    Endometrial cancer Neg Hx    Pancreatic cancer Neg Hx    Prostate cancer Neg Hx    Social History   Socioeconomic History   Marital status: Divorced    Spouse name: Not on file   Number of children: Not on file   Years of education: Not on file   Highest education level: Not on file  Occupational History   Not on file  Tobacco Use   Smoking status: Former    Current packs/day: 0.00    Types: Cigarettes    Quit date: 1999    Years since quitting: 25.7    Passive exposure: Past   Smokeless tobacco: Never  Vaping Use   Vaping status: Never Used  Substance and Sexual Activity   Alcohol use: Yes    Comment: rare   Drug use: No   Sexual activity: Not Currently  Other Topics Concern   Not on file  Social History Narrative   ** Merged History Encounter **       Social Determinants of Health   Financial Resource Strain: Low Risk  (07/11/2023)   Overall Financial Resource Strain (CARDIA)    Difficulty of Paying Living Expenses:  Not very hard  Food Insecurity: No Food Insecurity (07/11/2023)   Hunger Vital Sign    Worried About Running Out of Food in the Last Year: Never true    Ran Out of Food in the Last Year: Never true  Transportation Needs: No Transportation Needs (07/11/2023)   PRAPARE - Transportation    Lack of Transportation (Medical): No    Lack  of Transportation (Non-Medical): No  Physical Activity: Inactive (07/11/2023)   Exercise Vital Sign    Days of Exercise per Week: 0 days    Minutes of Exercise per Session: 0 min  Stress: No Stress Concern Present (07/11/2023)   [employer] of Occupational Health - Occupational Stress Questionnaire    Feeling of Stress : Not at all  Social Connections: Not on file    Objective:  BP 126/62   Pulse 72   Temp (!) 96 F (35.6 C)   Resp 18   Ht 5\' 1"  (1.549 m)   Wt 235 lb 12.8 oz (107 kg)   BMI 44.55 kg/m      07/11/2023    1:53 PM 06/15/2023   10:23 AM 03/16/2023    4:19 PM  BP/Weight  Systolic BP 126 133 131  Diastolic BP 62 48 49  Wt. (Lbs) 235.8 236.38 227  BMI 44.55 kg/m2 41.87 kg/m2 40.21 kg/m2    Physical Exam Vitals and nursing note reviewed.  Constitutional:      Appearance: Normal appearance.  HENT:     Head: Normocephalic and atraumatic.     Mouth/Throat:     Mouth: Mucous membranes are moist.     Pharynx: Oropharynx is clear.  Eyes:     Extraocular Movements: Extraocular movements intact.     Pupils: Pupils are equal, round, and reactive to light.  Cardiovascular:     Rate and Rhythm: Normal rate and regular rhythm.  Pulmonary:     Effort: Pulmonary effort is normal.     Breath sounds: Normal breath sounds.  Musculoskeletal:        General: Normal range of motion.     Comments: Lymphedema of both legs noted. Non pitting, dry scaly skin.  Skin:    General: Skin is warm and dry.  Neurological:     General: No focal deficit present.     Mental Status: Sarah Bishop is alert and oriented to person, place, and time. Mental status is  at baseline.  Psychiatric:        Mood and Affect: Mood normal.        Behavior: Behavior normal.     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 Currently completed chemotherapy, had robotic assisted bilateral salpingo-oophorectomy, total hysterectomy done on May 10, 2022 by Dr. Shaaron Adler.  Sarah Bishop continues to follow-up with her gynecological oncologist.  Sarah Bishop did complain of urinary urgency and incontinence.  Sarah Bishop wears pads and changes several of them.  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  Plan: Advised to try Kegel's exercises at home -Advised to limit fluid  intake towards bedtime -To work on [employer] -If Sarah Bishop desires help with medication, could consider starting her on oxybutynin or Myrbetriq. -Could consider referral for pelvic floor physical therapy if available.     Controlled type 2 diabetes mellitus with complication, without long-term current use of insulin (HCC) Assessment & Plan: Has controlled type 2 diabetes, last hemoglobin A1c that I see on the chart was 6.4.  Sarah Bishop has been on glipizide but I will discontinue this due to her controlled diabetes and the risk of hypoglycemia and weight gain with glipizide.   Plan: Discontinue  glipizide -Started Jardiance 10 mg daily -Continue to work on healthy low calorie diet to help with diabetes management and weight management -Blood work in couple months from now   Stage 3a chronic kidney disease (HCC) Assessment & Plan: Sarah Bishop has chronic kidney disease stage IIIa secondary to controlled type 2 diabetes. This has been stable. -In view of her CKD 3 and type 2 diabetes, prescribed Jardiance 10 mg daily. -Discussed side effects of the medication including a small risk of urinary tract infections or yeast infections -Will continue to monitor kidney function   Anemia, chronic disease Assessment & Plan: Anemia of chronic disease possibly secondary to her CKD 3.  Will continue to monitor   Encounter for immunization Assessment & Plan: Sarah Bishop received flu shot and COVID-vaccine today  Orders: -     Flu Vaccine Trivalent High Dose (Fluad) -     Pfizer Comirnaty Covid-19 Vaccine 31yrs & older  History of seizures Assessment & Plan: Rabia reports history of seizure disorder, last seizure in 1994.  Sarah Bishop has been on Depakote for several decades now. Has been compliant with medication Wants to come off of the medication.  Plan: Advised that I will place a referral to neurology for them to evaluate the need for EEG or any testing before offering her a taper plan for discontinuation of Depakote -It is reassuring that Sarah Bishop does not drive, so should be okay to taper and either have her on a lower dose or discontinue the medication.  But I will let the neurologist discussed that.  Orders: -     Ambulatory referral to Neurology  Obesity, Class III, BMI 40-49.9 (morbid obesity) (HCC) Assessment & Plan: Class III severe obesity.  Diet and exercise recommendations made Started on Jardiance which should help to some extent. Will consider GLP-1 agonist at the next visit    Other orders -     Allopurinol; Take 1 tablet (300 mg total) by mouth daily.  Dispense: 90 tablet; Refill:  0 -     Empagliflozin; Take 1 tablet (10 mg total) by mouth daily before breakfast.  Dispense: 90 tablet; Refill: 1     Meds ordered this encounter  Medications   allopurinol (ZYLOPRIM) 300 MG tablet    Sig: Take 1 tablet (300 mg total) by mouth daily.    Dispense:  90 tablet    Refill:  0   empagliflozin (JARDIANCE) 10 MG TABS tablet    Sig: Take 1 tablet (10 mg total) by mouth daily before breakfast.    Dispense:  90 tablet    Refill:  1    Orders Placed This Encounter  Procedures   Flu Vaccine Trivalent High Dose (Fluad)   Pfizer Comirnaty Covid-19 Vaccine 68yrs & older   Ambulatory referral to Neurology     Follow-up: Return in about 8 weeks (around 09/05/2023).   I,Carolyn M Morrison,acting as a Neurosurgeon for [employer], MD.,have documented  all relevant documentation on the behalf of Windell Moment, MD,as directed by  Windell Moment, MD while in the presence of Windell Moment, MD.   An After Visit Summary was printed and given to the patient.  Windell Moment, MD [facility] [phone]

## 2023-07-12 NOTE — Assessment & Plan Note (Signed)
Class III severe obesity.  Diet and exercise recommendations made Started on Jardiance which should help to some extent. Will consider GLP-1 agonist at the next visit

## 2023-07-12 NOTE — Assessment & Plan Note (Signed)
Has controlled type 2 diabetes, last hemoglobin A1c that I see on the chart was 6.4.  She has been on glipizide but I will discontinue this due to her controlled diabetes and the risk of hypoglycemia and weight gain with glipizide.   Plan: Discontinue glipizide -Started Jardiance 10 mg daily -Continue to work on healthy low calorie diet to help with diabetes management and weight management -Blood work in couple months from now

## 2023-07-12 NOTE — Assessment & Plan Note (Signed)
She has chronic kidney disease stage IIIa secondary to controlled type 2 diabetes. This has been stable. -In view of her CKD 3 and type 2 diabetes, prescribed Jardiance 10 mg daily. -Discussed side effects of the medication including a small risk of urinary tract infections or yeast infections -Will continue to monitor kidney function

## 2023-07-12 NOTE — Assessment & Plan Note (Signed)
Anemia of chronic disease possibly secondary to her CKD 3.  Will continue to monitor

## 2023-07-12 NOTE — Assessment & Plan Note (Addendum)
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 Currently completed chemotherapy, had robotic assisted bilateral salpingo-oophorectomy, total hysterectomy done on May 10, 2022 by Dr. Shaaron Adler.  She continues to follow-up with her gynecological oncologist.  She did complain of urinary urgency and incontinence.  She wears pads and changes several of them.  Advised that the etiology of her urinary urgency could be multifactorial, she has type 2 diabetes, she has class III severe obesity, she has had total hysterectomy which also weakens the bladder support.  Plan: Advised to try Kegel's exercises at home -Advised to limit fluid intake towards bedtime -To work on [employer] management -If she desires help with medication, could consider starting her on oxybutynin or Myrbetriq. -Could consider referral for pelvic floor physical therapy if available.

## 2023-07-12 NOTE — Assessment & Plan Note (Signed)
She received flu shot and COVID-vaccine today

## 2023-07-12 NOTE — Assessment & Plan Note (Signed)
Sarah Bishop reports history of seizure disorder, last seizure in 1994.  She has been on Depakote for several decades now. Has been compliant with medication Wants to come off of the medication.  Plan: Advised that I will place a referral to neurology for them to evaluate the need for EEG or any testing before offering her a taper plan for discontinuation of Depakote -It is reassuring that she does not drive, so should be okay to taper and either have her on a lower dose or discontinue the medication.  But I will let the neurologist discussed that.

## 2023-07-18 ENCOUNTER — Encounter: Payer: Self-pay | Admitting: Neurology

## 2023-08-11 ENCOUNTER — Telehealth: Payer: Self-pay

## 2023-08-11 ENCOUNTER — Other Ambulatory Visit: Payer: Self-pay

## 2023-08-11 NOTE — Telephone Encounter (Signed)
Copied from CRM [id number]. Topic: Clinical - Medication Question >> Aug 11, 2023  9:08 AM Herbert Seta B wrote: Reason for CRM: Patient saw provider 1 month ago was started on Jardiance. Patient unhappy with medicine, blood sugar has been slightly more elevated 150-190 range x1 week. Call daughter to discuss [name]

## 2023-08-22 ENCOUNTER — Telehealth: Payer: Self-pay

## 2023-08-22 NOTE — Telephone Encounter (Signed)
Per Dr.Tucker, pt's appointment time on 12/13 has been moved from 2:15 up to 12:30 with lab at 12:15. Pt's daughter agrees to new appointment time.

## 2023-09-04 ENCOUNTER — Ambulatory Visit: Payer: Medicare Other

## 2023-09-04 ENCOUNTER — Other Ambulatory Visit: Payer: Self-pay

## 2023-09-04 VITALS — Ht 61.0 in | Wt 236.0 lb

## 2023-09-04 DIAGNOSIS — Z0000 Encounter for general adult medical examination without abnormal findings: Secondary | ICD-10-CM

## 2023-09-04 NOTE — Progress Notes (Addendum)
Subjective:   Sarah Bishop is a 82 y.o. female who presents for an Initial Medicare Annual Wellness Visit.  Visit Complete: Virtual I connected with  [name] on 09/04/23 by a audio enabled telemedicine application and verified that I am speaking with the correct person using two identifiers.  Patient Location: Home  Provider Location: Home Office  I discussed the limitations of evaluation and management by telemedicine. The patient expressed understanding and agreed to proceed.  Vital Signs: Because this visit was a virtual/telehealth visit, some criteria may be missing or patient reported. Any vitals not documented were not able to be obtained and vitals that have been documented are patient reported.  Cardiac Risk Factors include: advanced age (>80men, >83 women);diabetes mellitus;dyslipidemia;family history of premature cardiovascular disease;hypertension;obesity (BMI >30kg/m2);sedentary lifestyle     Objective:    Today's Vitals   09/04/23 0924  Weight: 236 lb (107 kg)  Height: 5\' 1"  (1.549 m)  PainSc: 0-No pain   Body mass index is 44.59 kg/m.     09/04/2023    9:26 AM 06/09/2022   12:56 PM 05/31/2022    3:39 PM 05/01/2022    8:27 PM 04/20/2022    9:48 AM 12/25/2012    8:39 AM  Advanced Directives  Does Patient Have a Medical Advance Directive? Yes Yes Yes Yes Yes Patient has advance directive, copy not in chart  Type of Advance Directive Healthcare Power of [place];Living will Healthcare Power of [place];Living will  Healthcare Power of [place];Living will Healthcare Power of [place];Living will Living will;Healthcare Power of Attorney  Does patient want to make changes to medical advance directive?  No - Patient declined No - Patient declined No - Patient declined No - Patient declined   Copy of Healthcare Power of Attorney in Chart? No - copy requested Yes - validated most recent copy scanned in chart (See row information)        Current Medications  (verified) Outpatient Encounter Medications as of 09/04/2023  Medication Sig   acetaminophen (TYLENOL) 500 MG tablet Take 500-1,000 mg by mouth every 6 (six) hours as needed (pain.).   allopurinol (ZYLOPRIM) 300 MG tablet Take 1 tablet (300 mg total) by mouth daily.   amLODipine (NORVASC) 5 MG tablet Take 5 mg by mouth at bedtime.   atenolol (TENORMIN) 50 MG tablet Take 100 mg by mouth at bedtime.   atorvastatin (LIPITOR) 80 MG tablet Take 80 mg by mouth every other day. At night   cholecalciferol (VITAMIN D3) 25 MCG (1000 UNIT) tablet Take 2,000 Units by mouth at bedtime.   citalopram (CELEXA) 10 MG tablet Take 10 mg by mouth in the morning.   divalproex (DEPAKOTE) 250 MG DR tablet Take 750 mg by mouth in the morning.   empagliflozin (JARDIANCE) 10 MG TABS tablet Take 1 tablet (10 mg total) by mouth daily before breakfast.   furosemide (LASIX) 20 MG tablet Take 40 mg by mouth in the morning.   glipiZIDE (GLUCOTROL) 5 MG tablet Take 5 mg by mouth in the morning.   Lancets (ONETOUCH DELICA PLUS LANCET33G) MISC Apply topically daily.   levothyroxine (SYNTHROID) 50 MCG tablet Take 50 mcg by mouth daily before breakfast.   miconazole (BAZA ANTIFUNGAL) 2 % cream Apply 1 Application topically 2 (two) times daily as needed (skin irritation.).   No facility-administered encounter medications on file as of 09/04/2023.    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   Right Mid Paraspinal-Moderate   Diabetes (HCC) 03/09/2022   Diabetes mellitus without complication (HCC)    Dyspnea    with exertion   Epilepsy (HCC) 03/09/2022   Gout    Heart murmur 03/09/2022   Hx of seizure disorder 03/09/2022   Hyperlipidemia    Hypertension    Hypertension 03/09/2022   Hypothyroidism    Peripheral vascular disease (HCC)    Seizures (HCC)    diagnosed age 72yrs, no  seizures for "yrs"   Sleep apnea    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[facility])   Past Surgical History:  Procedure Laterality Date   BREAST BIOPSY Left    BREAST BIOPSY Left    DILATION AND CURETTAGE OF UTERUS     DILITATION & CURRETTAGE/HYSTROSCOPY WITH VERSAPOINT RESECTION N/A 12/26/2012   Procedure: DILATATION & CURETTAGE/HYSTEROSCOPY WITH VERSAPOINT RESECTION;  Surgeon: Genia Del, MD;  Location: [facility];  Service: Gynecology;  Laterality: N/A;   IR IMAGING GUIDED PORT INSERTION  06/09/2022   IR REMOVAL TUN ACCESS W/ PORT W/O FL MOD SED  12/29/2022   JOINT REPLACEMENT     MANDIBLE FRACTURE SURGERY     ROBOTIC ASSISTED BILATERAL SALPINGO OOPHERECTOMY Bilateral 05/10/2022   Procedure: XI ROBOTIC ASSISTED BILATERAL SALPINGO OOPHORECTOMY, TOTAL HYSTERECTOMY, CYSTOSCOPY;  Surgeon: Carver Fila, MD;  Location: [facility];  Service: Gynecology;  Laterality: Bilateral;   TOTAL KNEE ARTHROPLASTY Bilateral    Family History  Problem Relation Age of Onset   Heart disease Maternal Grandmother    Heart disease Maternal Grandfather    Stroke Maternal Grandfather    Colon cancer Maternal Grandfather        dx 78s   Breast cancer Other    Cancer Half-Brother        in spine   Ovarian cancer Neg Hx    Endometrial cancer Neg Hx    Pancreatic cancer Neg Hx    Prostate cancer Neg Hx    Social History   Socioeconomic History   Marital status: Divorced    Spouse name: Not on file   Number of children: Not on file   Years of education: Not on file   Highest education level: Not on file  Occupational History   Not on file  Tobacco Use   Smoking status: Former    Current packs/day: 0.00    Types: Cigarettes    Quit date: 1999    Years since quitting: 25.9    Passive exposure: Past   Smokeless tobacco: Never  Vaping Use   Vaping status: Never Used  Substance and Sexual Activity   Alcohol use: Yes    Comment: rare   Drug use: No    Sexual activity: Not Currently  Other Topics Concern   Not on file  Social History Narrative   ** Merged History Encounter **       Social Determinants of Health   Financial Resource Strain: Low Risk  (09/04/2023)   Overall Financial Resource Strain (CARDIA)    Difficulty of Paying Living Expenses: Not very hard  Food Insecurity: No Food Insecurity (09/04/2023)   Hunger Vital Sign    Worried About Running Out of Food in the Last Year: Never true    Ran Out of Food in the Last Year: Never true  Transportation Needs: No Transportation Needs (09/04/2023)   PRAPARE - Administrator, Civil Service (Medical): No  Lack of Transportation (Non-Medical): No  Physical Activity: Inactive (09/04/2023)   Exercise Vital Sign    Days of Exercise per Week: 0 days    Minutes of Exercise per Session: 0 min  Stress: No Stress Concern Present (09/04/2023)   [employer] of Occupational Health - Occupational Stress Questionnaire    Feeling of Stress : Not at all  Social Connections: Unknown (09/04/2023)   Social Connection and Isolation Panel [NHANES]    Frequency of Communication with Friends and Family: More than three times a week    Frequency of Social Gatherings with Friends and Family: More than three times a week    Attends Religious Services: Not on Marketing executive or Organizations: Yes    Attends Banker Meetings: Not on file    Marital Status: Divorced    Tobacco Counseling Counseling given: Not Answered   Clinical Intake:  Pre-visit preparation completed: Yes  Pain : No/denies pain Pain Score: 0-No pain     BMI - recorded: 44.59 Nutritional Status: BMI > 30  Obese Nutritional Risks: None Diabetes: Yes CBG done?: No Did pt. bring in CBG monitor from home?: No  How often do you need to have someone help you when you read instructions, pamphlets, or other written materials from your doctor or pharmacy?: 1 - Never What is the last  grade level you completed in school?: 2 years of college  Interpreter Needed?: No  Information entered by :: Brogan England N. Darcell Yacoub, LPN.   Activities of Daily Living    09/04/2023    9:29 AM  In your present state of health, do you have any difficulty performing the following activities:  Hearing? 0  Vision? 0  Difficulty concentrating or making decisions? 0  Walking or climbing stairs? 0  Dressing or bathing? 0  Doing errands, shopping? 0  Preparing Food and eating ? N  Using the Toilet? N  In the past six months, have you accidently leaked urine? Y  Do you have problems with loss of bowel control? Y  Managing your Medications? N  Managing your Finances? N  Housekeeping or managing your Housekeeping? N    Patient Care Team: Windell Moment, MD as PCP - General (Family Medicine) Janalyn Harder, MD (Inactive) as Consulting Physician (Dermatology) Janalyn Harder, MD (Inactive) as Consulting Physician (Dermatology)  Indicate any recent Medical Services you may have received from other than [facility] providers in the past year (date may be approximate).     Assessment:   This is a routine wellness examination for [employer].  Hearing/Vision screen Hearing Screening - Comments:: Patient has hearing difficulties and wears hearing aids.  Vision Screening - Comments:: Any vision impairment: Yes, patient wears corrective lenses/contacts/readers. Eye exam done annually by: Patient not up to date with eye exam.    Goals Addressed             This Visit's Progress    Client understands the importance of follow-up with providers by attending scheduled visits        Depression Screen    09/04/2023    9:28 AM 07/11/2023    2:02 PM 07/11/2023    1:58 PM  PHQ 2/9 Scores  PHQ - 2 Score 0 0 0  PHQ- 9 Score 0      Fall Risk    09/04/2023    9:28 AM 07/11/2023    1:58 PM  Fall Risk   Falls in the past year? 1 1  Number  falls in past yr: 1 1  Injury with Fall? 1   Risk for fall due  to : History of fall(s);Impaired balance/gait;Orthopedic patient History of fall(s)  Follow up Education provided;Falls prevention discussed;Falls evaluation completed Falls evaluation completed;Falls prevention discussed    MEDICARE RISK AT HOME: Medicare Risk at Home Any stairs in or around the home?: Yes (stair lift) If so, are there any without handrails?: No Home free of loose throw rugs in walkways, pet beds, electrical cords, etc?: Yes Adequate lighting in your home to reduce risk of falls?: Yes Life alert?: Yes Use of a cane, walker or w/c?: Yes Grab bars in the bathroom?: Yes Shower chair or bench in shower?: Yes Elevated toilet seat or a handicapped toilet?: Yes  TIMED UP AND GO:  Was the test performed? No    Cognitive Function:    09/04/2023    9:38 AM  MMSE - Mini Mental State Exam  Not completed: Unable to complete        09/04/2023    9:38 AM  6CIT Screen  What Year? 0 points  What month? 0 points  What time? 0 points  Count back from 20 0 points  Months in reverse 0 points  Repeat phrase 0 points  Total Score 0 points    Immunizations Immunization History  Administered Date(s) Administered   Fluad Trivalent(High Dose 65+) 07/11/2023   Influenza-Unspecified 07/12/2018, 08/03/2021   Moderna Covid-19 Vaccine Bivalent Booster 82yrs & up 08/18/2021   PFIZER Comirnaty(Gray Top)Covid-19 Tri-Sucrose Vaccine 11/09/2019, 11/30/2019, 08/01/2020   Pfizer(Comirnaty)Fall Seasonal Vaccine 12 years and older 07/11/2023   Pneumococcal Conjugate-13 06/11/2014   Pneumococcal Polysaccharide-23 04/22/2009   Zoster, Live 04/28/2010    TDAP status: Due, Education has been provided regarding the importance of this vaccine. Advised may receive this vaccine at local pharmacy or Health Dept. Aware to provide a copy of the vaccination record if obtained from local pharmacy or Health Dept. Verbalized acceptance and understanding.  Flu Vaccine status: Up to  date  Pneumococcal vaccine status: Up to date  Covid-19 vaccine status: Completed vaccines  Qualifies for Shingles Vaccine? Yes   Zostavax completed Yes   Shingrix Completed?: No.    Education has been provided regarding the importance of this vaccine. Patient has been advised to call insurance company to determine out of pocket expense if they have not yet received this vaccine. Advised may also receive vaccine at local pharmacy or Health Dept. Verbalized acceptance and understanding.  Screening Tests Health Maintenance  Topic Date Due   FOOT EXAM  Never done   OPHTHALMOLOGY EXAM  Never done   Diabetic kidney evaluation - Urine ACR  Never done   DTaP/Tdap/Td (1 - Tdap) Never done   Zoster Vaccines- Shingrix (1 of 2) 03/20/1959   DEXA SCAN  Never done   HEMOGLOBIN A1C  11/02/2022   COVID-19 Vaccine (6 - 2023-24 season) 09/05/2023   Diabetic kidney evaluation - eGFR measurement  06/12/2024   Medicare Annual Wellness (AWV)  09/03/2024   Pneumonia Vaccine 66+ Years old  Completed   INFLUENZA VACCINE  Completed   HPV VACCINES  Aged Out    Health Maintenance  Health Maintenance Due  Topic Date Due   FOOT EXAM  Never done   OPHTHALMOLOGY EXAM  Never done   Diabetic kidney evaluation - Urine ACR  Never done   DTaP/Tdap/Td (1 - Tdap) Never done   Zoster Vaccines- Shingrix (1 of 2) 03/20/1959   DEXA SCAN  Never done  HEMOGLOBIN A1C  11/02/2022   COVID-19 Vaccine (6 - 2023-24 season) 09/05/2023    Colorectal cancer screening: No longer required.   Mammogram status: No longer required due to age.  Bone Density status: No record on file.  Lung Cancer Screening: (Low Dose CT Chest recommended if Age 76-80 years, 20 pack-year currently smoking OR have quit w/in 15years.) does not qualify.   Lung Cancer Screening Referral: no  Additional Screening:  Hepatitis C Screening: does not qualify.  Vision Screening: Recommended annual ophthalmology exams for early detection of  glaucoma and other disorders of the eye. Is the patient up to date with their annual eye exam?  No  Who is the provider or what is the name of the office in which the patient attends annual eye exams? Patient could not remember the name. If pt is not established with a provider, would they like to be referred to a provider to establish care? No .   Dental Screening: Recommended annual dental exams for proper oral hygiene  Diabetic Foot Exam: Diabetic Foot Exam: Overdue, Pt has been advised about the importance in completing this exam. Pt is scheduled for diabetic foot exam on 09/12/2023.  Community Resource Referral / Chronic Care Management: CRR required this visit?  No   CCM required this visit?  No     Plan:     I have personally reviewed and noted the following in the patient's chart:   Medical and social history Use of alcohol, tobacco or illicit drugs  Current medications and supplements including opioid prescriptions. Patient is not currently taking opioid prescriptions. Functional ability and status Nutritional status Physical activity Advanced directives List of other physicians Hospitalizations, surgeries, and ER visits in previous 12 months Vitals Screenings to include cognitive, depression, and falls Referrals and appointments  In addition, I have reviewed and discussed with patient certain preventive protocols, quality metrics, and best practice recommendations. A written personalized care plan for preventive services as well as general preventive health recommendations were provided to patient.     Mickeal Needy, LPN   42/04/622   After Visit Summary: (MyChart) Due to this being a telephonic visit, the after visit summary with patients personalized plan was offered to patient via MyChart   Nurse Notes: None

## 2023-09-04 NOTE — Patient Instructions (Signed)
Sarah Bishop , Thank you for taking time to come for your Medicare Wellness Visit. I appreciate your ongoing commitment to your health goals. Please review the following plan we discussed and let me know if I can assist you in the future.   Referrals/Orders/Follow-Ups/Clinician Recommendations: No  This is a list of the screening recommended for you and due dates:  Health Maintenance  Topic Date Due   Complete foot exam   Never done   Eye exam for diabetics  Never done   Yearly kidney health urinalysis for diabetes  Never done   DTaP/Tdap/Td vaccine (1 - Tdap) Never done   Zoster (Shingles) Vaccine (1 of 2) 03/20/1959   DEXA scan (bone density measurement)  Never done   Hemoglobin A1C  11/02/2022   COVID-19 Vaccine (6 - 2023-24 season) 09/05/2023   Yearly kidney function blood test for diabetes  06/12/2024   Medicare Annual Wellness Visit  09/03/2024   Pneumonia Vaccine  Completed   Flu Shot  Completed   HPV Vaccine  Aged Out    Advanced directives: (Copy Requested) Please bring a copy of your health care power of attorney and living will to the office to be added to your chart at your convenience.  Next Medicare Annual Wellness Visit scheduled for next year: Yes

## 2023-09-05 LAB — HM DIABETES EYE EXAM

## 2023-09-12 ENCOUNTER — Telehealth: Payer: Self-pay | Admitting: *Deleted

## 2023-09-12 ENCOUNTER — Ambulatory Visit (INDEPENDENT_AMBULATORY_CARE_PROVIDER_SITE_OTHER): Payer: Medicare Other

## 2023-09-12 VITALS — BP 120/50 | HR 66 | Temp 98.5°F | Resp 16 | Ht 61.0 in | Wt 229.6 lb

## 2023-09-12 DIAGNOSIS — I890 Lymphedema, not elsewhere classified: Secondary | ICD-10-CM | POA: Insufficient documentation

## 2023-09-12 DIAGNOSIS — N1832 Chronic kidney disease, stage 3b: Secondary | ICD-10-CM | POA: Diagnosis not present

## 2023-09-12 DIAGNOSIS — E1142 Type 2 diabetes mellitus with diabetic polyneuropathy: Secondary | ICD-10-CM | POA: Diagnosis not present

## 2023-09-12 LAB — HEMOGLOBIN A1C
Est. average glucose Bld gHb Est-mCnc: 146 mg/dL
Hgb A1c MFr Bld: 6.7 % — ABNORMAL HIGH (ref 4.8–5.6)

## 2023-09-12 MED ORDER — GLIPIZIDE 5 MG PO TABS: 5.0000 mg | ORAL_TABLET | Freq: Every morning | ORAL | 1 refills | Status: DC

## 2023-09-12 MED ORDER — LEVOTHYROXINE SODIUM 50 MCG PO TABS: 50.0000 ug | ORAL_TABLET | Freq: Every day | ORAL | 1 refills | Status: DC

## 2023-09-12 MED ORDER — CITALOPRAM HYDROBROMIDE 10 MG PO TABS: 10.0000 mg | ORAL_TABLET | Freq: Every morning | ORAL | 1 refills | Status: DC

## 2023-09-12 MED ORDER — ALLOPURINOL 300 MG PO TABS: 300.0000 mg | ORAL_TABLET | Freq: Every day | ORAL | 3 refills | Status: DC

## 2023-09-12 MED ORDER — VITAMIN D 25 MCG (1000 UNIT) PO TABS: 2000.0000 [IU] | ORAL_TABLET | Freq: Every day | ORAL | 1 refills | Status: AC

## 2023-09-12 MED ORDER — ATORVASTATIN CALCIUM 80 MG PO TABS: 80.0000 mg | ORAL_TABLET | ORAL | 1 refills | Status: DC

## 2023-09-12 MED ORDER — EMPAGLIFLOZIN 10 MG PO TABS: 10.0000 mg | ORAL_TABLET | Freq: Every day | ORAL | 1 refills | Status: DC

## 2023-09-12 MED ORDER — AMLODIPINE BESYLATE 5 MG PO TABS: 5.0000 mg | ORAL_TABLET | Freq: Every day | ORAL | 1 refills | Status: DC

## 2023-09-12 NOTE — Assessment & Plan Note (Signed)
Leg swellings Likely multifactorial, including sedentary lifestyle and possible lymphedema. -Advise patient to elevate legs when resting, wear compression socks, and moisturize legs with Vaseline several times a day. -Encourage increased physical activity, including walking 10 minutes for every hour of sitting.

## 2023-09-12 NOTE — Assessment & Plan Note (Addendum)
Class 3 severe obesity Does not move much and watches TV all day Eats reasonably well  Plan: continue Jardiance - advised to move around more - advised to avoid late night snacking and eat mindfully - will continue to monitor weight and manage obesity

## 2023-09-12 NOTE — Progress Notes (Signed)
Subjective:  Patient ID: Sarah Bishop, female    DOB: [birth year]  Age: 82 y.o. MRN: [id number]  Chief Complaint  Patient presents with   Medical Management of Chronic Issues    HPI   Discussed the use of AI scribe software for clinical note transcription with the patient, who gave verbal consent to proceed.  History of Present Illness         The patient, with a history of type 2 diabetes, kidney disease, and epilepsy, presents with concerns about elevated blood sugar levels. Her daughter is present with her. Despite being on Jardiance and Glipizide, the patient reports that her blood sugar levels have been consistently in the 160s and 170s, which is higher than her usual range. The patient started Jardiance on October 11th and added Glipizide on November 8th due to perceived inadequate control of blood sugar levels. The patient maintains a detailed food diary and reports eating healthily, with the exception of a late-night snack. The patient also mentions experiencing swelling in her legs, which she attributes to prolonged periods of sitting.     09/12/2023    9:14 AM 09/04/2023    9:28 AM 07/11/2023    2:02 PM 07/11/2023    1:58 PM  Depression screen PHQ 2/9  Decreased Interest 0 0 0 0  Down, Depressed, Hopeless 0 0 0 0  PHQ - 2 Score 0 0 0 0  Altered sleeping 0 0    Tired, decreased energy 0 0    Change in appetite 0 0    Feeling bad or failure about yourself  0 0    Trouble concentrating 0 0    Moving slowly or fidgety/restless 0 0    Suicidal thoughts 0 0    PHQ-9 Score 0 0    Difficult doing work/chores Not difficult at all Not difficult at all          09/12/2023    9:09 AM  Fall Risk   Falls in the past year? 1  Number falls in past yr: 1  Injury with Fall? 0  Risk for fall due to : Impaired balance/gait  Follow up Falls evaluation completed    Patient Care Team: Windell Moment, MD as PCP - General (Family Medicine) Janalyn Harder, MD (Inactive) as  Consulting Physician (Dermatology) Janalyn Harder, MD (Inactive) as Consulting Physician (Dermatology)   Review of Systems  Constitutional: Negative.   HENT: Negative.    Eyes: Negative.   Respiratory: Negative.    Cardiovascular:  Positive for leg swelling.  Gastrointestinal: Negative.   Endocrine: Negative.   Genitourinary:        Urinary incontinence  Musculoskeletal: Negative.   Neurological: Negative.   Psychiatric/Behavioral: Negative.    All other systems reviewed and are negative.   Current Outpatient Medications on File Prior to Visit  Medication Sig Dispense Refill   acetaminophen (TYLENOL) 500 MG tablet Take 500-1,000 mg by mouth every 6 (six) hours as needed (pain.).     atenolol (TENORMIN) 50 MG tablet Take 100 mg by mouth at bedtime.     divalproex (DEPAKOTE) 250 MG DR tablet Take 750 mg by mouth in the morning.     furosemide (LASIX) 20 MG tablet Take 40 mg by mouth in the morning.     Lancets (ONETOUCH DELICA PLUS LANCET33G) MISC Apply topically daily.     No current facility-administered medications on file prior to visit.   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[facility])   Past Surgical History:  Procedure Laterality Date   BREAST BIOPSY Left    BREAST BIOPSY Left    DILATION AND CURETTAGE OF UTERUS     DILITATION & CURRETTAGE/HYSTROSCOPY WITH VERSAPOINT RESECTION N/A 12/26/2012   Procedure:  DILATATION & CURETTAGE/HYSTEROSCOPY WITH VERSAPOINT RESECTION;  Surgeon: Genia Del, MD;  Location: [facility];  Service: Gynecology;  Laterality: N/A;   IR IMAGING GUIDED PORT INSERTION  06/09/2022   IR REMOVAL TUN ACCESS W/ PORT W/O FL MOD SED  12/29/2022   JOINT REPLACEMENT     MANDIBLE FRACTURE SURGERY     ROBOTIC ASSISTED BILATERAL SALPINGO OOPHERECTOMY Bilateral 05/10/2022   Procedure: XI ROBOTIC ASSISTED BILATERAL SALPINGO OOPHORECTOMY, TOTAL HYSTERECTOMY, CYSTOSCOPY;  Surgeon: Carver Fila, MD;  Location: [facility];  Service: Gynecology;  Laterality: Bilateral;   TOTAL KNEE ARTHROPLASTY Bilateral     Family History  Problem Relation Age of Onset   Heart disease Maternal Grandmother    Heart disease Maternal Grandfather    Stroke Maternal Grandfather    Colon cancer Maternal Grandfather        dx 83s   Breast cancer Other    Cancer Half-Brother        in spine   Ovarian cancer Neg Hx    Endometrial cancer Neg Hx    Pancreatic cancer Neg Hx    Prostate cancer Neg Hx    Social History   Socioeconomic History   Marital status: Divorced    Spouse name: Not on file   Number of children: Not on file   Years of education: Not on file   Highest education level: Not on file  Occupational History   Not on file  Tobacco Use   Smoking status: Former    Current packs/day: 0.00    Types: Cigarettes    Quit date: 1999    Years since quitting: 25.9    Passive exposure: Past   Smokeless tobacco: Never  Vaping Use   Vaping status: Never Used  Substance and Sexual Activity   Alcohol use: Yes    Comment: rare   Drug use: No   Sexual activity: Not Currently  Other Topics Concern   Not on file  Social History Narrative   ** Merged History Encounter **       Social Determinants of Health   Financial Resource Strain: Low Risk  (09/04/2023)   Overall Financial Resource Strain (CARDIA)    Difficulty of Paying Living Expenses: Not very hard  Food Insecurity: No Food  Insecurity (09/04/2023)   Hunger Vital Sign    Worried About Running Out of Food in the Last Year: Never true    Ran Out of Food in the Last Year: Never true  Transportation Needs: No Transportation Needs (09/04/2023)   PRAPARE - Transportation    Lack of Transportation (Medical): No    Lack  of Transportation (Non-Medical): No  Physical Activity: Inactive (09/04/2023)   Exercise Vital Sign    Days of Exercise per Week: 0 days    Minutes of Exercise per Session: 0 min  Stress: No Stress Concern Present (09/04/2023)   [employer] of Occupational Health - Occupational Stress Questionnaire    Feeling of Stress : Not at all  Social Connections: Unknown (09/04/2023)   Social Connection and Isolation Panel [NHANES]    Frequency of Communication with Friends and Family: More than three times a week    Frequency of Social Gatherings with Friends and Family: More than three times a week    Attends Religious Services: Not on Marketing executive or Organizations: Yes    Attends Engineer, structural: Not on file    Marital Status: Divorced    Objective:  BP (!) 120/50 (BP Location: Right Arm, Patient Position: Sitting, Cuff Size: Large)   Pulse 66   Temp 98.5 F (36.9 C) (Temporal)   Resp 16   Ht 5\' 1"  (1.549 m)   Wt 229 lb 9.6 oz (104.1 kg)   SpO2 97%   BMI 43.38 kg/m      09/12/2023    9:09 AM 09/04/2023    9:24 AM 07/11/2023    1:53 PM  BP/Weight  Systolic BP 120  829  Diastolic BP 50  62  Wt. (Lbs) 229.6 236 235.8  BMI 43.38 kg/m2 44.59 kg/m2 44.55 kg/m2    Physical Exam Vitals and nursing note reviewed.  Constitutional:      Appearance: She is obese.  HENT:     Head: Normocephalic and atraumatic.  Eyes:     Pupils: Pupils are equal, round, and reactive to light.  Cardiovascular:     Rate and Rhythm: Normal rate and regular rhythm.  Pulmonary:     Effort: Pulmonary effort is normal.     Breath sounds: Normal breath sounds.  Musculoskeletal:      Cervical back: Normal range of motion.     Comments: Lymphedema and very dry legs   Neurological:     General: No focal deficit present.     Mental Status: She is alert.  Psychiatric:        Mood and Affect: Mood normal.     Diabetic Foot Exam - Simple   No data filed      Lab Results  Component Value Date   WBC 9.2 06/13/2023   HGB 10.9 (L) 06/13/2023   HCT 33.6 (L) 06/13/2023   PLT 156 06/13/2023   GLUCOSE 119 (H) 06/13/2023   ALT 10 06/13/2023   AST 12 (L) 06/13/2023   NA 140 06/13/2023   K 4.7 06/13/2023   CL 105 06/13/2023   CREATININE 1.42 (H) 06/13/2023   BUN 49 (H) 06/13/2023   CO2 28 06/13/2023   INR 1.0 05/13/2008   HGBA1C 6.4 (H) 05/02/2022      Assessment & Plan:    Type 2 diabetes, controlled, with peripheral neuropathy (HCC) Assessment & Plan: Elevated fasting blood glucose levels despite treatment with Jardiance 10mg  daily and Glipizide 5mg  daily. Discussed the importance of consistent medication use, diet control, and physical activity. -Continue Jardiance 10mg  daily and Glipizide 5mg  daily. -Order Hemoglobin A1c to assess average blood glucose control. -Consider increasing Jardiance to 25mg  daily and discontinuing Glipizide depending on Hemoglobin A1c results and patient's dietary changes. -Encourage patient to maintain a healthy diet, avoid late-night snacking, and increase physical activity.  Orders: -  Hemoglobin A1c -     CBC  Stage 3b chronic kidney disease (HCC) Assessment & Plan: Secondary to Diabetes type 2, age and chronic hypertension Stable Will continue to monitor   Orders: -     Comprehensive metabolic panel -     CBC  Obesity, Class III, BMI 40-49.9 (morbid obesity) (HCC) Assessment & Plan: Class 3 severe obesity Does not move much and watches TV all day Eats reasonably well  Plan: continue Jardiance - advised to move around more - advised to avoid late night snacking and eat mindfully - will continue to monitor  weight and manage obesity   Lymphedema of both lower extremities Assessment & Plan: Leg swellings Likely multifactorial, including sedentary lifestyle and possible lymphedema. -Advise patient to elevate legs when resting, wear compression socks, and moisturize legs with Vaseline several times a day. -Encourage increased physical activity, including walking 10 minutes for every hour of sitting.   Other orders -     Allopurinol; Take 1 tablet (300 mg total) by mouth daily.  Dispense: 90 tablet; Refill: 3 -     amLODIPine Besylate; Take 1 tablet (5 mg total) by mouth at bedtime.  Dispense: 100 tablet; Refill: 1 -     Atorvastatin Calcium; Take 1 tablet (80 mg total) by mouth every other day. At night  Dispense: 100 tablet; Refill: 1 -     Vitamin D; Take 2 tablets (2,000 Units total) by mouth at bedtime.  Dispense: 60 tablet; Refill: 1 -     Citalopram Hydrobromide; Take 1 tablet (10 mg total) by mouth in the morning.  Dispense: 90 tablet; Refill: 1 -     Empagliflozin; Take 1 tablet (10 mg total) by mouth daily before breakfast.  Dispense: 90 tablet; Refill: 1 -     glipiZIDE; Take 1 tablet (5 mg total) by mouth in the morning.  Dispense: 90 tablet; Refill: 1 -     Levothyroxine Sodium; Take 1 tablet (50 mcg total) by mouth daily before breakfast.  Dispense: 90 tablet; Refill: 1     Meds ordered this encounter  Medications   allopurinol (ZYLOPRIM) 300 MG tablet    Sig: Take 1 tablet (300 mg total) by mouth daily.    Dispense:  90 tablet    Refill:  3    Please send a replace/new response with 100-Day Supply if appropriate to maximize member benefit. Requesting 1 year supply.   amLODipine (NORVASC) 5 MG tablet    Sig: Take 1 tablet (5 mg total) by mouth at bedtime.    Dispense:  100 tablet    Refill:  1   atorvastatin (LIPITOR) 80 MG tablet    Sig: Take 1 tablet (80 mg total) by mouth every other day. At night    Dispense:  100 tablet    Refill:  1   cholecalciferol (VITAMIN D3) 25  MCG (1000 UNIT) tablet    Sig: Take 2 tablets (2,000 Units total) by mouth at bedtime.    Dispense:  60 tablet    Refill:  1   citalopram (CELEXA) 10 MG tablet    Sig: Take 1 tablet (10 mg total) by mouth in the morning.    Dispense:  90 tablet    Refill:  1   empagliflozin (JARDIANCE) 10 MG TABS tablet    Sig: Take 1 tablet (10 mg total) by mouth daily before breakfast.    Dispense:  90 tablet    Refill:  1   glipiZIDE (GLUCOTROL) 5 MG  tablet    Sig: Take 1 tablet (5 mg total) by mouth in the morning.    Dispense:  90 tablet    Refill:  1   levothyroxine (SYNTHROID) 50 MCG tablet    Sig: Take 1 tablet (50 mcg total) by mouth daily before breakfast.    Dispense:  90 tablet    Refill:  1    Orders Placed This Encounter  Procedures   Hemoglobin A1c   Comprehensive metabolic panel   CBC     Follow-up: Return in about 3 months (around 12/11/2023) for chronic disease follow up.   I,Angela Taylor,acting as a Neurosurgeon for [employer], MD.,have documented all relevant documentation on the behalf of Windell Moment, MD,as directed by  Windell Moment, MD while in the presence of Windell Moment, MD.   An After Visit Summary was printed and given to the patient.  Windell Moment, MD [facility] [phone]

## 2023-09-12 NOTE — Telephone Encounter (Signed)
Per Dr Pricilla Holm moved appt from 12/13 to 12/23. Daughter aware

## 2023-09-12 NOTE — Assessment & Plan Note (Signed)
Secondary to Diabetes type 2, age and chronic hypertension Stable Will continue to monitor

## 2023-09-12 NOTE — Patient Instructions (Signed)
VISIT SUMMARY:  During today's visit, we discussed your concerns about elevated blood sugar levels, swelling in your legs, and reviewed your current medications and lifestyle habits. We have made some adjustments to your treatment plan to help manage your conditions more effectively.  YOUR PLAN:  -TYPE 2 DIABETES MELLITUS: Type 2 diabetes is a condition where your body does not use insulin properly, leading to high blood sugar levels. We will continue your current medications, Jardiance 10mg  daily and Glipizide 5mg  daily, and have ordered a Hemoglobin A1c test to assess your average blood sugar control. Depending on the results, we may increase your Jardiance dose and stop Glipizide. Please maintain a healthy diet, avoid late-night snacks, and increase your physical activity.  -CHRONIC KIDNEY DISEASE: Chronic kidney disease is a long-term condition where the kidneys do not work as well as they should. Your condition is stable, and we will continue Jardiance for kidney protection. We have ordered renal function tests since it has been more than three months since your last assessment.  -LOWER EXTREMITY EDEMA: Lower extremity edema is swelling in the legs, which can be caused by various factors including a sedentary lifestyle. To help reduce the swelling, please elevate your legs when resting, wear compression socks, and moisturize your legs with Vaseline several times a day. Also, try to walk for 10 minutes every hour you spend sitting.  INSTRUCTIONS:  Please follow up in 3 months. We will review your Hemoglobin A1c and renal function test results at that time.

## 2023-09-12 NOTE — Assessment & Plan Note (Signed)
Elevated fasting blood glucose levels despite treatment with Jardiance 10mg  daily and Glipizide 5mg  daily. Discussed the importance of consistent medication use, diet control, and physical activity. -Continue Jardiance 10mg  daily and Glipizide 5mg  daily. -Order Hemoglobin A1c to assess average blood glucose control. -Consider increasing Jardiance to 25mg  daily and discontinuing Glipizide depending on Hemoglobin A1c results and patient's dietary changes. -Encourage patient to maintain a healthy diet, avoid late-night snacking, and increase physical activity.

## 2023-09-13 LAB — COMPREHENSIVE METABOLIC PANEL
ALT: 12 [IU]/L (ref 0–32)
AST: 14 [IU]/L (ref 0–40)
Albumin: 4.5 g/dL (ref 3.7–4.7)
Alkaline Phosphatase: 88 [IU]/L (ref 44–121)
BUN/Creatinine Ratio: 32 — ABNORMAL HIGH (ref 12–28)
BUN: 60 mg/dL — ABNORMAL HIGH (ref 8–27)
Bilirubin Total: 0.3 mg/dL (ref 0.0–1.2)
CO2: 22 mmol/L (ref 20–29)
Calcium: 10.2 mg/dL (ref 8.7–10.3)
Chloride: 99 mmol/L (ref 96–106)
Creatinine, Ser: 1.87 mg/dL — ABNORMAL HIGH (ref 0.57–1.00)
Globulin, Total: 2.6 g/dL (ref 1.5–4.5)
Glucose: 130 mg/dL — ABNORMAL HIGH (ref 70–99)
Potassium: 4.8 mmol/L (ref 3.5–5.2)
Sodium: 141 mmol/L (ref 134–144)
Total Protein: 7.1 g/dL (ref 6.0–8.5)
eGFR: 26 mL/min/{1.73_m2} — ABNORMAL LOW (ref >59)

## 2023-09-13 LAB — CBC
Hematocrit: 38.6 % (ref 34.0–46.6)
Hemoglobin: 13 g/dL (ref 11.1–15.9)
MCH: 31.5 pg (ref 26.6–33.0)
MCHC: 33.7 g/dL (ref 31.5–35.7)
MCV: 94 fL (ref 79–97)
Platelets: 211 10*3/uL (ref 150–450)
RBC: 4.13 x10E6/uL (ref 3.77–5.28)
RDW: 14 % (ref 11.7–15.4)
WBC: 11.2 10*3/uL — ABNORMAL HIGH (ref 3.4–10.8)

## 2023-09-14 ENCOUNTER — Other Ambulatory Visit: Payer: Self-pay

## 2023-09-14 DIAGNOSIS — R7989 Other specified abnormal findings of blood chemistry: Secondary | ICD-10-CM

## 2023-09-15 ENCOUNTER — Inpatient Hospital Stay: Payer: Medicare Other

## 2023-09-15 ENCOUNTER — Other Ambulatory Visit: Payer: Medicare Other

## 2023-09-15 ENCOUNTER — Encounter: Payer: Self-pay | Admitting: Neurology

## 2023-09-15 ENCOUNTER — Ambulatory Visit: Payer: Medicare Other | Admitting: Neurology

## 2023-09-15 ENCOUNTER — Inpatient Hospital Stay: Payer: Medicare Other | Admitting: Gynecologic Oncology

## 2023-09-15 VITALS — BP 143/50 | HR 67 | Ht 63.0 in | Wt 232.4 lb

## 2023-09-15 DIAGNOSIS — G40909 Epilepsy, unspecified, not intractable, without status epilepticus: Secondary | ICD-10-CM

## 2023-09-15 DIAGNOSIS — Z79899 Other long term (current) drug therapy: Secondary | ICD-10-CM | POA: Diagnosis not present

## 2023-09-15 DIAGNOSIS — C5702 Malignant neoplasm of left fallopian tube: Secondary | ICD-10-CM

## 2023-09-15 NOTE — Progress Notes (Signed)
NEUROLOGY CONSULTATION NOTE  [name] MRN: [id number] DOB: [birth year]  Referring provider: Dr. Windell Moment Primary care provider: Dr. Windell Moment  Reason for consult:  history of seizures, need for medication  Dear Dr Faylene Kurtz:  Thank you for your kind referral of Sarah Bishop for consultation of the above symptoms. Although her history is well known to you, please allow me to reiterate it for the purpose of our medical record. The patient was accompanied to the clinic by her daughter Sarah Bishop who also provides collateral information. Records and images were personally reviewed where available.   HISTORY OF PRESENT ILLNESS: This is a very pleasant 82 year old right-handed woman with a history of hypertension, hyperlipidemia, DM2, hypothyroidism, epilepsy, presenting for evaluation of need for continued seizure medication. Seizures started at age 1, initially she had not warning however later on she would feel her eyes start blinking and have a certain feeling. She would have staring that may progressive to a convulsion. She denies any myoclonic jerks, olfactory/gustatory hallucinations, focal numbness/tingling/weakness. One time she was in a car and the light through the trees made her feel like she would have a seizure. Stress was also a trigger. No nocturnal seizures. She cannot recall if there was a catamenial component. She has been on Depakote 750mg  every morning for at least 40 years, seizure-free since 1994. [name] denies any staring/unresponsive episodes. There was one instance when her dog passed away 2 years ago and she was acting "weird," very emotional, unable to get her words out. No recurrence. She used to have migraines, no further headaches. No dizziness, diplopia, dysarthria/dysphagia. She has neck and back pain, urinary incontinence. She has neuropathy in both feet. No tremors. She usually gets 8-9 hours of sleep. Mood is very relaxes. Shelives with [name] and does not  drive. She manages her own medications.  Epilepsy Risk Factors:  She had a normal birth and early development.  There is no history of febrile convulsions, CNS infections such as meningitis/encephalitis, significant traumatic brain injury, neurosurgical procedures, or family history of seizures.  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[facility])    PAST SURGICAL HISTORY: Past Surgical History:  Procedure Laterality Date   BREAST BIOPSY Left    BREAST BIOPSY Left    DILATION AND CURETTAGE OF UTERUS     DILITATION & CURRETTAGE/HYSTROSCOPY WITH VERSAPOINT RESECTION N/A 12/26/2012   Procedure: DILATATION & CURETTAGE/HYSTEROSCOPY WITH VERSAPOINT RESECTION;  Surgeon: Genia Del, MD;  Location: [facility];  Service: Gynecology;  Laterality: N/A;   IR IMAGING GUIDED PORT INSERTION  06/09/2022   IR REMOVAL TUN ACCESS W/ PORT W/O FL MOD SED  12/29/2022   JOINT REPLACEMENT     MANDIBLE FRACTURE SURGERY     ROBOTIC ASSISTED BILATERAL SALPINGO OOPHERECTOMY Bilateral 05/10/2022  Procedure: XI ROBOTIC ASSISTED BILATERAL SALPINGO OOPHORECTOMY, TOTAL HYSTERECTOMY, CYSTOSCOPY;  Surgeon: Carver Fila, MD;  Location: [facility];  Service: Gynecology;  Laterality: Bilateral;   TOTAL KNEE ARTHROPLASTY Bilateral     MEDICATIONS: Current Outpatient Medications on File Prior to Visit   Medication Sig Dispense Refill   acetaminophen (TYLENOL) 500 MG tablet Take 500-1,000 mg by mouth every 6 (six) hours as needed (pain.).     allopurinol (ZYLOPRIM) 300 MG tablet Take 1 tablet (300 mg total) by mouth daily. 90 tablet 3   amLODipine (NORVASC) 5 MG tablet Take 1 tablet (5 mg total) by mouth at bedtime. 100 tablet 1   atenolol (TENORMIN) 50 MG tablet Take 100 mg by mouth at bedtime.     atorvastatin (LIPITOR) 80 MG tablet Take 1 tablet (80 mg total) by mouth every other day. At night 100 tablet 1   cholecalciferol (VITAMIN D3) 25 MCG (1000 UNIT) tablet Take 2 tablets (2,000 Units total) by mouth at bedtime. 60 tablet 1   citalopram (CELEXA) 10 MG tablet Take 1 tablet (10 mg total) by mouth in the morning. 90 tablet 1   divalproex (DEPAKOTE) 250 MG DR tablet Take 750 mg by mouth in the morning.     empagliflozin (JARDIANCE) 10 MG TABS tablet Take 1 tablet (10 mg total) by mouth daily before breakfast. 90 tablet 1   furosemide (LASIX) 20 MG tablet Take 40 mg by mouth in the morning.     Lancets (ONETOUCH DELICA PLUS LANCET33G) MISC Apply topically daily.     levothyroxine (SYNTHROID) 50 MCG tablet Take 1 tablet (50 mcg total) by mouth daily before breakfast. 90 tablet 1   glipiZIDE (GLUCOTROL) 5 MG tablet Take 1 tablet (5 mg total) by mouth in the morning. (Patient not taking: Reported on 09/15/2023) 90 tablet 1   No current facility-administered medications on file prior to visit.    ALLERGIES: Allergies  Allergen Reactions   Augmentin [Amoxicillin-Pot Clavulanate] Other (See Comments)    Unsure of reaction.   Iodinated Contrast Media Nausea And Vomiting and Other (See Comments)    FAMILY HISTORY: Family History  Problem Relation Age of Onset   Heart disease Maternal Grandmother    Heart disease Maternal Grandfather    Stroke Maternal Grandfather    Colon cancer Maternal Grandfather        dx 32s   Breast cancer Other    Cancer Half-Brother        in spine   Ovarian  cancer Neg Hx    Endometrial cancer Neg Hx    Pancreatic cancer Neg Hx    Prostate cancer Neg Hx     SOCIAL HISTORY: Social History   Socioeconomic History   Marital status: Divorced    Spouse name: Not on file   Number of children: Not on file   Years of education: Not on file   Highest education level: Not on file  Occupational History   Not on file  Tobacco Use   Smoking status: Former    Current packs/day: 0.00    Types: Cigarettes    Quit date: 1999    Years since quitting: 25.9    Passive exposure: Past   Smokeless tobacco: Never  Vaping Use   Vaping status: Never Used  Substance and Sexual Activity   Alcohol use: Yes    Comment: rare   Drug use: No   Sexual activity: Not Currently  Other Topics Concern   Not on file  Social History Narrative   **  Merged History Encounter **    Are you right handed or left handed? Right    Are you currently employed ? no   What is your current occupation?na   Do you live at home alone? no   Who lives with you? Lives daughter    What type of home do you live in: 1 story or 2 story?  3 story has a  lift. Lives on main level        Social Drivers of Health   Financial Resource Strain: Low Risk  (09/04/2023)   Overall Financial Resource Strain (CARDIA)    Difficulty of Paying Living Expenses: Not very hard  Food Insecurity: No Food Insecurity (09/04/2023)   Hunger Vital Sign    Worried About Running Out of Food in the Last Year: Never true    Ran Out of Food in the Last Year: Never true  Transportation Needs: No Transportation Needs (09/04/2023)   PRAPARE - Administrator, Civil Service (Medical): No    Lack of Transportation (Non-Medical): No  Physical Activity: Inactive (09/04/2023)   Exercise Vital Sign    Days of Exercise per Week: 0 days    Minutes of Exercise per Session: 0 min  Stress: No Stress Concern Present (09/04/2023)   [employer] of Occupational Health - Occupational Stress Questionnaire     Feeling of Stress : Not at all  Social Connections: Unknown (09/04/2023)   Social Connection and Isolation Panel [NHANES]    Frequency of Communication with Friends and Family: More than three times a week    Frequency of Social Gatherings with Friends and Family: More than three times a week    Attends Religious Services: Not on file    Active Member of Clubs or Organizations: Yes    Attends Banker Meetings: Not on file    Marital Status: Divorced  Intimate Partner Violence: Not At Risk (09/04/2023)   Humiliation, Afraid, Rape, and Kick questionnaire    Fear of Current or Ex-Partner: No    Emotionally Abused: No    Physically Abused: No    Sexually Abused: No     PHYSICAL EXAM: Vitals:   09/15/23 1043  BP: (!) 143/50  Pulse: 67  SpO2: 95%   General: No acute distress Head:  Normocephalic/atraumatic Skin/Extremities: No rash, no edema Neurological Exam: Mental status: alert and awake, no dysarthria or aphasia, Fund of knowledge is appropriate. Attention and concentration are normal.  Cranial nerves: CN I: not tested CN II: pupils equal, round, visual fields intact CN III, IV, VI:  full range of motion, no nystagmus, no ptosis CN V: facial sensation intact CN VII: upper and lower face symmetric CN VIII: hearing intact to conversation Bulk & Tone: normal, no fasciculations. Motor: 5/5 throughout with no pronator drift. Sensation: intact to light touch, cold, pin on both UE, decreased pin and cold on both LE, decreased vibration sense to left knee, right hip.  Deep Tendon Reflexes: +1 throughout Cerebellar: no incoordination on finger to nose testing Gait: narrow-based and steady, no ataxia Tremor: none  IMPRESSION: This is a very pleasant 82 year old right-handed woman with a history of hypertension, hyperlipidemia, DM2, hypothyroidism, epilepsy, presenting for evaluation of need for continued seizure medication. Seizures suggestive of Primary Generalized  Epilepsy, she has been seizure-free since 1994 on low dose Depakote 750mg  daily. We discussed doing a 1-hour EEG. If normal, we can slowly wean Depakote, understanding risks of breakthrough seizure with any medication adjustment. They would  like to proceed with EEG, but opted to continue Depakote for now. We discussed potential effect of long-term Depakote on bone health, check bone density scan. She does not drive. Follow-up in 6 months, call for any changes.    Thank you for allowing me to participate in the care of this patient. Please do not hesitate to call for any questions or concerns.   Patrcia Dolly, M.D.  CC: Dr. Faylene Kurtz

## 2023-09-15 NOTE — Patient Instructions (Addendum)
Good to meet you.  Schedule 1-hour EEG  2. Schedule bone density scan  3. Continue Depakote 250mg : 3 tablets every morning  4. Follow-up in 6 months, call for any changes   Seizure Precautions: 1. If medication has been prescribed for you to prevent seizures, take it exactly as directed.  Do not stop taking the medicine without talking to your doctor first, even if you have not had a seizure in a long time.   2. Avoid activities in which a seizure would cause danger to yourself or to others.  Don't operate dangerous machinery, swim alone, or climb in high or dangerous places, such as on ladders, roofs, or girders.  Do not drive unless your doctor says you may.  3. If you have any warning that you may have a seizure, lay down in a safe place where you can't hurt yourself.    4.  No driving for 6 months from last seizure, as per [facility].   Please refer to the following link on the [employer]'s website for more information: http://www.epilepsyfoundation.org/answerplace/Social/driving/drivingu.cfm   5.  Maintain good sleep hygiene.  6.  Contact your doctor if you have any problems that may be related to the medicine you are taking.  7.  Call 911 and bring the patient back to the ED if:        A.  The seizure lasts longer than 5 minutes.       B.  The patient doesn't awaken shortly after the seizure  C.  The patient has new problems such as difficulty seeing, speaking or moving  D.  The patient was injured during the seizure  E.  The patient has a temperature over 102 F (39C)  F.  The patient vomited and now is having trouble breathing

## 2023-09-18 MED ORDER — EMPAGLIFLOZIN 25 MG PO TABS: 25.0000 mg | ORAL_TABLET | Freq: Every day | ORAL | 1 refills | Status: DC

## 2023-09-21 ENCOUNTER — Ambulatory Visit: Payer: Medicare Other | Admitting: Neurology

## 2023-09-21 DIAGNOSIS — Z79899 Other long term (current) drug therapy: Secondary | ICD-10-CM | POA: Diagnosis not present

## 2023-09-21 DIAGNOSIS — G40909 Epilepsy, unspecified, not intractable, without status epilepticus: Secondary | ICD-10-CM | POA: Diagnosis not present

## 2023-09-21 NOTE — Progress Notes (Unsigned)
EEG complete - results pending 

## 2023-09-25 ENCOUNTER — Inpatient Hospital Stay: Payer: Medicare Other | Admitting: Gynecologic Oncology

## 2023-09-25 ENCOUNTER — Encounter: Payer: Self-pay | Admitting: Gynecologic Oncology

## 2023-09-25 ENCOUNTER — Encounter: Payer: Self-pay | Admitting: Oncology

## 2023-09-25 ENCOUNTER — Inpatient Hospital Stay: Payer: Medicare Other | Attending: Gynecologic Oncology

## 2023-09-25 VITALS — BP 138/55 | HR 60 | Temp 97.7°F | Resp 18 | Wt 229.5 lb

## 2023-09-25 DIAGNOSIS — Z803 Family history of malignant neoplasm of breast: Secondary | ICD-10-CM

## 2023-09-25 DIAGNOSIS — Z87891 Personal history of nicotine dependence: Secondary | ICD-10-CM | POA: Insufficient documentation

## 2023-09-25 DIAGNOSIS — C5702 Malignant neoplasm of left fallopian tube: Secondary | ICD-10-CM

## 2023-09-25 DIAGNOSIS — Z90710 Acquired absence of both cervix and uterus: Secondary | ICD-10-CM | POA: Insufficient documentation

## 2023-09-25 DIAGNOSIS — Z9221 Personal history of antineoplastic chemotherapy: Secondary | ICD-10-CM | POA: Diagnosis not present

## 2023-09-25 DIAGNOSIS — R971 Elevated cancer antigen 125 [CA 125]: Secondary | ICD-10-CM | POA: Insufficient documentation

## 2023-09-25 DIAGNOSIS — Z90722 Acquired absence of ovaries, bilateral: Secondary | ICD-10-CM | POA: Insufficient documentation

## 2023-09-25 DIAGNOSIS — Z800 Family history of malignant neoplasm of digestive organs: Secondary | ICD-10-CM

## 2023-09-25 DIAGNOSIS — Z8544 Personal history of malignant neoplasm of other female genital organs: Secondary | ICD-10-CM

## 2023-09-25 LAB — COMPREHENSIVE METABOLIC PANEL
ALT: 13 U/L (ref 0–44)
AST: 13 U/L — ABNORMAL LOW (ref 15–41)
Albumin: 4.3 g/dL (ref 3.5–5.0)
Alkaline Phosphatase: 70 U/L (ref 38–126)
Anion gap: 10 (ref 5–15)
BUN: 53 mg/dL — ABNORMAL HIGH (ref 8–23)
CO2: 29 mmol/L (ref 22–32)
Calcium: 10.1 mg/dL (ref 8.9–10.3)
Chloride: 99 mmol/L (ref 98–111)
Creatinine, Ser: 1.66 mg/dL — ABNORMAL HIGH (ref 0.44–1.00)
GFR, Estimated: 30 mL/min — ABNORMAL LOW (ref >60)
Glucose, Bld: 148 mg/dL — ABNORMAL HIGH (ref 70–99)
Potassium: 4.2 mmol/L (ref 3.5–5.1)
Sodium: 138 mmol/L (ref 135–145)
Total Bilirubin: 0.5 mg/dL (ref <1.2)
Total Protein: 7.1 g/dL (ref 6.5–8.1)

## 2023-09-25 LAB — CBC WITH DIFFERENTIAL/PLATELET
Abs Immature Granulocytes: 0.11 10*3/uL — ABNORMAL HIGH (ref 0.00–0.07)
Basophils Absolute: 0.1 10*3/uL (ref 0.0–0.1)
Basophils Relative: 1 %
Eosinophils Absolute: 0.5 10*3/uL (ref 0.0–0.5)
Eosinophils Relative: 4 %
HCT: 38.1 % (ref 36.0–46.0)
Hemoglobin: 12.8 g/dL (ref 12.0–15.0)
Immature Granulocytes: 1 %
Lymphocytes Relative: 29 %
Lymphs Abs: 3 10*3/uL (ref 0.7–4.0)
MCH: 32.2 pg (ref 26.0–34.0)
MCHC: 33.6 g/dL (ref 30.0–36.0)
MCV: 95.7 fL (ref 80.0–100.0)
Monocytes Absolute: 1.2 10*3/uL — ABNORMAL HIGH (ref 0.1–1.0)
Monocytes Relative: 11 %
Neutro Abs: 5.6 10*3/uL (ref 1.7–7.7)
Neutrophils Relative %: 54 %
Platelets: 170 10*3/uL (ref 150–400)
RBC: 3.98 MIL/uL (ref 3.87–5.11)
RDW: 14.9 % (ref 11.5–15.5)
WBC: 10.4 10*3/uL (ref 4.0–10.5)
nRBC: 0 % (ref 0.0–0.2)

## 2023-09-25 NOTE — Progress Notes (Signed)
Requested FOLR Alpha testing on accession [id number] with [facility] via email.

## 2023-09-25 NOTE — Progress Notes (Signed)
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  No significant cystic component or ascites.  Renal lesion of the right kidney lower pole is thought to represent a benign lesion.  1.3 cm cystic lesion of the head of the pancreas is recommended to be follow-up with pancreatic MRI in 2 years.   05/02/2022 Tumor Marker   Patient's tumor was tested for the following markers: CA-125. Results of the tumor marker test revealed 69.   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 Fullness appreciated within the left cul de sac. On intra-abdominal entry, normal upper abdominal survey including liver edge, diaphragm, and stomach. Normal appearing omentum. No ascites. Normal appearing small bowel. Significant inflammation within the pelvis concerning initially for recent diverticulitis versus endometriosis. Thickened and inflamed pelvic peritoneum. Filmy adhesions between the anterior cul de sac and uterus, between the right adnexa and and the sigmoid colon. Right adnexa inflammed but otherwise normal in appearing. Left fallopian tube dilated with evidence of hydrosalpinx. Friable adhesions of the fallopian tube to the sigmoid mesentery, left ovary and left broad ligament. No ascites. Uterus bulbous and 10cm with findings of endosalpingiosis versus  endometriosis on much of the uterine serosa. Dense adhesions between the bladder and cervix. Cul de sac without evidence of disease. No ascites.  Cystoscopy findings: bladder dome intact, normal efflux from bilateral ureteral orifices. On frozen section, no abnormality of the endometrium. Anterior cul de sac adhesion with findings of hemosiderin, no definitive malignancy. Fallopian tube with malignancy.  Given findings and pelvic peritoneal involvement given fallopian tube adherent to surrounding structures, no upper abdominal findings, normal omentum, and normal neuroassessment intraoperatively as well as on preoperative imaging, lymphadenectomy deferred.   05/10/2022 Pathology Results   Procedure: Hysterectomy with bilateral salpingo-oophorectomy  Specimen Integrity: Intact  Tumor Site: Left fallopian tube  Tumor Size: 6.3 x 3.8 x 2.8 cm  Histologic Type: Serous carcinoma  Histologic Grade: High-grade  Ovarian Surface Involvement: Not identified  Fallopian Tube Surface Involvement: Not identified  Implants (required for advanced stage serous/seromucinous borderline  tumors only): Not applicable  Other Tissue/ Organ Involvement: Not applicable  Largest Extrapelvic Peritoneal Focus: Not applicable  Peritoneal/Ascitic Fluid Involvement: Not identified [facility] [phone])  Chemotherapy Response Score (CRS): Not applicable, no known presurgical  therapy  Regional Lymph Nodes: Not applicable (no lymph nodes submitted or found)  Distant Metastasis: Not applicable  Pathologic Stage Classification (pTNM, AJCC 8th Edition): pT1a, pN n/a  Ancillary Studies: Available upon request  Representative Tumor Block: B15  Comment(s): An immunohistochemistry stain for p53 is performed with  adequate control and is diffusely positive within the tumor.  The same  stain shows patchy variable staining within the reactive mesothelium  lining the serosal adhesions within the anterior cul-de-sac    05/17/2022 Initial  Diagnosis   Fallopian tube cancer, carcinoma, left (HCC)   05/30/2022 Cancer Staging   Staging form: Ovary, Fallopian Tube, and Primary Peritoneal Carcinoma,  AJCC 8th Edition - Pathologic stage from 05/30/2022: FIGO Stage IIB (pT2b, pN0, cM0) - Signed by Artis Delay, MD on 05/30/2022 Stage prefix: Initial diagnosis   06/10/2022 - 11/11/2022 Chemotherapy   Patient is on Treatment Plan : OVARIAN Carboplatin (AUC 6) + Paclitaxel (175) q21d X 6 Cycles     06/10/2022 Procedure   Placement of a subcutaneous power-injectable port device. Catheter tip at the superior cavoatrial junction.      Genetic Testing   Negative genetic testing. No pathogenic variants identified on the [facility] Multi-Cancer+RNA panel. The report date is 09/06/2022.   The Multi-Cancer + RNA Panel offered by [facility] includes sequencing and/or deletion/duplication analysis of the following 70 genes:  AIP*, ALK, APC*, ATM*, AXIN2*, BAP1*, BARD1*, BLM*, BMPR1A*, BRCA1*, BRCA2*, BRIP1*, CDC73*, CDH1*, CDK4, CDKN1B*, CDKN2A, CHEK2*, CTNNA1*, DICER1*, EPCAM, EGFR, FH*, FLCN*, GREM1, HOXB13, KIT, LZTR1, MAX*, MBD4, MEN1*, MET, MITF, MLH1*, MSH2*, MSH3*, MSH6*, MUTYH*, NF1*, NF2*, NTHL1*, PALB2*, PDGFRA, PMS2*, POLD1*, POLE*, POT1*, PRKAR1A*, PTCH1*, PTEN*, RAD51C*, RAD51D*, RB1*, RET, SDHA*, SDHAF2*, SDHB*, SDHC*, SDHD*, SMAD4*, SMARCA4*, SMARCB1*, SMARCE1*, STK11*, SUFU*, TMEM127*, TP53*, TSC1*, TSC2*, VHL*. RNA analysis is performed for * genes.   11/14/2022 Tumor Marker   Patient's tumor was tested for the following markers: CA-125. Results of the tumor marker test revealed 13.9.   12/12/2022 Imaging   1. No findings in the pelvis to suggest local recurrence. No evidence for metastatic disease in the abdomen or pelvis. 2. The cystic pancreatic head lesion seen on MRI is not well demonstrated by CT but measures approximately 10 mm. Continued attention on restaging scans recommended. Previous MRI recommended 2 year interval MRI follow-up. 3.  Cholelithiasis. 4.  Aortic Atherosclerosis (ICD10-I70.0).   01/02/2023 Procedure   Patient had successful removal of port    03/23/2023 Tumor Marker   Patient's tumor was tested for the following markers: CA-125. Results of the tumor marker test revealed 12.1.   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 Restaging assessment. * Tracking Code: BO * EXAM: CT ABDOMEN AND PELVIS WITHOUT CONTRAST TECHNIQUE: Multidetector CT imaging of the abdomen and pelvis was performed following the standard protocol without IV contrast. RADIATION DOSE REDUCTION: This exam was performed according to the departmental dose-optimization program which includes automated exposure control, adjustment of the mA and/or kV according to patient size and/or use of iterative reconstruction technique. COMPARISON:  12/09/2022 FINDINGS: Lower chest: Coronary and descending thoracic aortic atheromatous vascular disease. Mitral valve calcification. Hepatobiliary: Small depending gallstones in the gallbladder. No biliary dilatation. Otherwise unremarkable. Pancreas: The cystic lesion along the uncinate process previously seen on MRI is very poorly appreciable on CT today but appears roughly stable at about 0.9 cm on image 34 series 2. Spleen: Unremarkable Adrenals/Urinary Tract: Cysts of varying complexity as shown on MRI from 03/29/2022, these have been previously shown to be benign and require no further workup. Bilateral renal atrophy. Adrenal glands normal. Urinary bladder empty. Stomach/Bowel: Sigmoid colon diverticulosis. Chronic mild prominence of the appendix especially centrally or the appendiceal diameter is up to 0.9 cm on image 50 of series 2, this is been present at least since 07/13/2004 and is presumably incidental given the long-chronicity. Vascular/Lymphatic: Atherosclerosis is  present, including aortoiliac atherosclerotic disease. Atheromatous vascular calcification proximally in the superior mesenteric artery noted. No pathologic adenopathy. Reproductive: Uterus absent.  Adnexa unremarkable. Other: No supplemental non-categorized findings. Musculoskeletal: Lumbar spondylosis and degenerative  disc disease causing mild multilevel impingement. 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 IMPRESSION: 1. No findings of active malignancy. 2. Aortic atherosclerosis. 3. Cholelithiasis. 4. Sigmoid colon diverticulosis. 5. Chronic mild prominence of the appendix especially centrally, this has been present at least since 07/13/2004 and is presumably incidental given the long-chronicity. 6. 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 7. Lumbar spondylosis and degenerative disc disease causing mild multilevel impingement. 8. The cystic lesion along the uncinate process of the pancreas previously seen on MRI is very poorly appreciable on CT today but appears roughly stable at about 0.9 cm. Aortic Atherosclerosis (ICD10-I70.0). Electronically Signed   By: Gaylyn Rong M.D.   On: 06/14/2023 16:12        Interval History: Doing well.  Denies any abdominal or pelvic pain.  Reports normal bowel function.  Urinary function at baseline.  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([facility])    Past Surgical History:  Procedure Laterality Date   BREAST BIOPSY Left    BREAST BIOPSY Left    DILATION AND CURETTAGE OF UTERUS     DILITATION & CURRETTAGE/HYSTROSCOPY WITH VERSAPOINT RESECTION N/A 12/26/2012   Procedure: DILATATION & CURETTAGE/HYSTEROSCOPY WITH VERSAPOINT RESECTION;  Surgeon: Genia Del, MD;  Location: [facility];  Service: Gynecology;  Laterality: N/A;   IR IMAGING GUIDED PORT INSERTION  06/09/2022   IR REMOVAL TUN ACCESS W/ PORT W/O FL MOD SED  12/29/2022   JOINT REPLACEMENT     MANDIBLE FRACTURE SURGERY     ROBOTIC ASSISTED BILATERAL SALPINGO OOPHERECTOMY Bilateral 05/10/2022   Procedure: XI ROBOTIC ASSISTED BILATERAL SALPINGO OOPHORECTOMY, TOTAL HYSTERECTOMY, CYSTOSCOPY;  Surgeon: Carver Fila, MD;  Location: [facility];  Service: Gynecology;  Laterality: Bilateral;   TOTAL KNEE ARTHROPLASTY Bilateral     Family History  Problem Relation Age of Onset   Heart disease Maternal Grandmother    Heart disease Maternal Grandfather    Stroke Maternal Grandfather    Colon cancer Maternal Grandfather        dx 60s   Breast cancer Other    Cancer Half-Brother        in spine   Ovarian cancer Neg Hx    Endometrial cancer Neg Hx    Pancreatic cancer Neg Hx    Prostate cancer Neg Hx     Social History   Socioeconomic History   Marital status: Divorced    Spouse name: Not on file   Number of children: Not on file   Years of education: Not on file   Highest education level: Not on file  Occupational History   Not on file  Tobacco Use   Smoking status: Former    Current packs/day: 0.00    Types: Cigarettes    Quit date: 1999    Years  since quitting: 25.9    Passive exposure: Past   Smokeless tobacco: Never  Vaping Use   Vaping status: Never Used  Substance and Sexual Activity   Alcohol use: Yes    Comment: rare   Drug use: No   Sexual activity: Not Currently  Other Topics Concern   Not on file  Social  History Narrative   ** Merged History Encounter **    Are you right handed or left handed? Right    Are you currently employed ? no   What is your current occupation?na   Do you live at home alone? no   Who lives with you? Lives daughter    What type of home do you live in: 1 story or 2 story?  3 story has a  lift. Lives on main level        Social Drivers of Health   Financial Resource Strain: Low Risk  (09/04/2023)   Overall Financial Resource Strain (CARDIA)    Difficulty of Paying Living Expenses: Not very hard  Food Insecurity: No Food Insecurity (09/04/2023)   Hunger Vital Sign    Worried About Running Out of Food in the Last Year: Never true    Ran Out of Food in the Last Year: Never true  Transportation Needs: No Transportation Needs (09/04/2023)   PRAPARE - Administrator, Civil Service (Medical): No    Lack of Transportation (Non-Medical): No  Physical Activity: Inactive (09/04/2023)   Exercise Vital Sign    Days of Exercise per Week: 0 days    Minutes of Exercise per Session: 0 min  Stress: No Stress Concern Present (09/04/2023)   [employer] of Occupational Health - Occupational Stress Questionnaire    Feeling of Stress : Not at all  Social Connections: Unknown (09/04/2023)   Social Connection and Isolation Panel [NHANES]    Frequency of Communication with Friends and Family: More than three times a week    Frequency of Social Gatherings with Friends and Family: More than three times a week    Attends Religious Services: Not on Marketing executive or Organizations: Yes    Attends Banker Meetings: Not on file    Marital Status: Divorced    Current Medications:  Current Outpatient Medications:    acetaminophen (TYLENOL) 500 MG tablet, Take 500-1,000 mg by mouth every 6 (six) hours as needed (pain.)., Disp: , Rfl:    allopurinol (ZYLOPRIM) 300 MG tablet, Take 1 tablet (300 mg total) by mouth daily., Disp: 90 tablet, Rfl: 3    amLODipine (NORVASC) 5 MG tablet, Take 1 tablet (5 mg total) by mouth at bedtime., Disp: 100 tablet, Rfl: 1   atenolol (TENORMIN) 50 MG tablet, Take 100 mg by mouth at bedtime., Disp: , Rfl:    atorvastatin (LIPITOR) 80 MG tablet, Take 1 tablet (80 mg total) by mouth every other day. At night, Disp: 100 tablet, Rfl: 1   cholecalciferol (VITAMIN D3) 25 MCG (1000 UNIT) tablet, Take 2 tablets (2,000 Units total) by mouth at bedtime., Disp: 60 tablet, Rfl: 1   citalopram (CELEXA) 10 MG tablet, Take 1 tablet (10 mg total) by mouth in the morning., Disp: 90 tablet, Rfl: 1   divalproex (DEPAKOTE) 250 MG DR tablet, Take 750 mg by mouth in the morning., Disp: , Rfl:    empagliflozin (JARDIANCE) 25 MG TABS tablet, Take 1 tablet (25 mg total) by mouth daily before breakfast. (Patient taking differently: Take 25 mg  by mouth daily before breakfast. Pt takes 2 tablets before breakfast), Disp: 90 tablet, Rfl: 1   furosemide (LASIX) 20 MG tablet, Take 40 mg by mouth in the morning., Disp: , Rfl:    Lancets (ONETOUCH DELICA PLUS LANCET33G) MISC, Apply topically daily., Disp: , Rfl:    levothyroxine (SYNTHROID) 50 MCG tablet, Take 1 tablet (50 mcg total) by mouth daily before breakfast., Disp: 90 tablet, Rfl: 1  Review of Systems: + leg swelling, incontinence, frequency, joint pain Denies appetite changes, fevers, chills, fatigue, unexplained weight changes. Denies hearing loss, neck lumps or masses, mouth sores, ringing in ears or voice changes. Denies cough or wheezing.  Denies shortness of breath. Denies chest pain or palpitations.  Denies abdominal distention, pain, blood in stools, constipation, diarrhea, nausea, vomiting, or early satiety. Denies pain with intercourse, dysuria, hematuria. Denies hot flashes, pelvic pain, vaginal bleeding or vaginal discharge.   Denies back pain or muscle pain/cramps. Denies itching, rash, or wounds. Denies dizziness, headaches, numbness or seizures. Denies swollen lymph  nodes or glands, denies easy bruising or bleeding. Denies anxiety, depression, confusion, or decreased concentration.  Physical Exam: BP (!) 138/55 (BP Location: Left Arm, Patient Position: Sitting)   Pulse 60   Temp 97.7 F (36.5 C) (Oral)   Resp 18   Wt 229 lb 8 oz (104.1 kg)   SpO2 98%   BMI 40.65 kg/m  General: Alert, oriented, no acute distress. HEENT: Normocephalic, atraumatic, sclera anicteric. Chest: Clear to auscultation bilaterally.  No wheezes or rhonchi. Cardiovascular: regular rate and rhythm, no murmurs. Abdomen: Obese, soft, nontender.  Normoactive bowel sounds.  No masses or hepatosplenomegaly appreciated.  Well-healed incisions. Extremities: Grossly normal range of motion.  Warm, well perfused.  Trace edema bilaterally. Skin: No rashes or lesions noted. Lymphatics: No cervical, supraclavicular, or inguinal adenopathy. GU: Normal appearing external genitalia without erythema, excoriation, or lesions.  Speculum exam reveals mildly atrophic vaginal mucosa, no lesions, cuff intact.  Bimanual exam reveals no masses or nodularity.  Rectovaginal exam confirms these findings.  Laboratory & Radiologic Studies: Component Ref Range & Units (hover) 3 mo ago 6 mo ago 10 mo ago 1 yr ago  Cancer Antigen (CA) 125 14.1 12.1 CM 13.9 CM 69.0 High  CM  Comment: (NOTE)   Assessment & Plan: Sarah Bishop is a 82 y.o. 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 PDS 05/2022. Completed adj chemotherapy 11/2022. Negative germline. HRD negative. HER2 1+. Last imaging 06/2023, no findings of recurrent disease.   Doing well, NED on exam today. CA125 drawn this morning.   Per NCCN surveillance recommendations, we will continue with visits every 2-4 months.  The patient is scheduled to see Dr. Bertis Ruddy in September 2025.  I will see her for follow-up in April.  We reviewed signs and symptoms that should prompt a phone call before her next  visit.  20 minutes of total time was spent for this patient encounter, including preparation, face-to-face counseling with the patient and coordination of care, and documentation of the encounter.  Eugene Garnet, MD  Division of Gynecologic Oncology  Department of Obstetrics and Gynecology  [facility]

## 2023-09-25 NOTE — Patient Instructions (Signed)
It was good to see you today.  I do not see or feel any evidence of cancer recurrence on your exam.  I will see you for follow-up in 4 months.  As always, if you develop any new and concerning symptoms before your next visit, please call to see me sooner.  

## 2023-09-26 LAB — CA 125: Cancer Antigen (CA) 125: 14.3 U/mL (ref 0.0–38.1)

## 2023-09-29 ENCOUNTER — Encounter (HOSPITAL_COMMUNITY): Payer: Self-pay | Admitting: Gynecologic Oncology

## 2023-09-29 LAB — SURGICAL PATHOLOGY

## 2023-10-18 ENCOUNTER — Other Ambulatory Visit: Payer: Self-pay

## 2023-10-18 MED ORDER — EMPAGLIFLOZIN 25 MG PO TABS: 25.0000 mg | ORAL_TABLET | Freq: Every day | ORAL | 1 refills | Status: AC

## 2023-10-18 MED ORDER — ONETOUCH DELICA PLUS LANCET33G MISC: 2 refills | Status: DC

## 2023-10-18 NOTE — Telephone Encounter (Signed)
 Copied from CRM [id number]. Topic: Clinical - Medication Refill >> Oct 18, 2023  4:01 PM Brynn Caras wrote: Most Recent Primary Care Visit:  Provider: Loistine Rinne  Department: [facility]  Visit Type: OFFICE VISIT  Date: 09/12/2023  Medication:  empagliflozin  (JARDIANCE ) 25 MG TABS tablet Lancets (ONETOUCH DELICA PLUS LANCET33G) MISC  Has the patient contacted their pharmacy? Yes (Agent: If no, request that the patient contact the pharmacy for the refill. If patient does not wish to contact the pharmacy document the reason why and proceed with request.) (Agent: If yes, when and what did the pharmacy advise?)  Is this the correct pharmacy for this prescription? Yes If no, delete pharmacy and type the correct one.  This is the patient's preferred pharmacy:    [facility] - [place], [place] - [address] [facility] [address] [place] [address] [place] [address] Phone: [phone] Fax: [phone]  [facility] - [place], [place] - [address] [place] [address] Phone: [phone] Fax: [phone]   Has the prescription been filled recently?   Is the patient out of the medication?   Has the patient been seen for an appointment in the last year OR does the patient have an upcoming appointment?   Can we respond through MyChart?   Agent: Please be advised that Rx refills may take up to 3 business days. We ask that you follow-up with your pharmacy.

## 2023-10-19 ENCOUNTER — Telehealth: Payer: Self-pay

## 2023-10-19 NOTE — Telephone Encounter (Signed)
Pt called an informed  EEG was normal. Continue Depakote,

## 2023-10-19 NOTE — Telephone Encounter (Signed)
-----   Message from Van Clines sent at 10/09/2023 12:35 PM EST ----- Pls let patient/daughter know the EEG was normal. Continue Depakote, thanks

## 2023-10-22 ENCOUNTER — Other Ambulatory Visit: Payer: Self-pay

## 2023-11-13 ENCOUNTER — Other Ambulatory Visit: Payer: Self-pay

## 2023-11-13 MED ORDER — ATENOLOL 50 MG PO TABS: 100.0000 mg | ORAL_TABLET | Freq: Every day | ORAL | 0 refills | Status: DC

## 2023-11-13 NOTE — Telephone Encounter (Signed)
 Refill sent to pharmacy.

## 2023-11-16 ENCOUNTER — Other Ambulatory Visit: Payer: Self-pay

## 2023-11-21 ENCOUNTER — Encounter: Payer: Self-pay | Admitting: Nephrology

## 2023-12-08 ENCOUNTER — Telehealth: Payer: Self-pay

## 2023-12-08 NOTE — Telephone Encounter (Signed)
 Sarah Bishop from [facility]. They did peripheral vascular testing at home. Results were mild decrease flow on lower extremities. Dr Faylene Kurtz was informed. They mentioned that they will fax the results.

## 2023-12-18 ENCOUNTER — Ambulatory Visit (INDEPENDENT_AMBULATORY_CARE_PROVIDER_SITE_OTHER): Payer: Medicare Other

## 2023-12-18 VITALS — BP 110/60 | HR 63 | Temp 97.7°F | Ht 63.0 in | Wt 229.0 lb

## 2023-12-18 DIAGNOSIS — E785 Hyperlipidemia, unspecified: Secondary | ICD-10-CM

## 2023-12-18 DIAGNOSIS — G8929 Other chronic pain: Secondary | ICD-10-CM

## 2023-12-18 DIAGNOSIS — E66813 Obesity, class 3: Secondary | ICD-10-CM

## 2023-12-18 DIAGNOSIS — I10 Essential (primary) hypertension: Secondary | ICD-10-CM

## 2023-12-18 DIAGNOSIS — N1832 Chronic kidney disease, stage 3b: Secondary | ICD-10-CM | POA: Diagnosis not present

## 2023-12-18 DIAGNOSIS — M25511 Pain in right shoulder: Secondary | ICD-10-CM

## 2023-12-18 DIAGNOSIS — E118 Type 2 diabetes mellitus with unspecified complications: Secondary | ICD-10-CM

## 2023-12-18 DIAGNOSIS — G5620 Lesion of ulnar nerve, unspecified upper limb: Secondary | ICD-10-CM | POA: Insufficient documentation

## 2023-12-18 DIAGNOSIS — E039 Hypothyroidism, unspecified: Secondary | ICD-10-CM

## 2023-12-18 DIAGNOSIS — E1169 Type 2 diabetes mellitus with other specified complication: Secondary | ICD-10-CM

## 2023-12-18 MED ORDER — SEMAGLUTIDE(0.25 OR 0.5MG/DOS) 2 MG/3ML ~~LOC~~ SOPN: 0.2500 mg | PEN_INJECTOR | SUBCUTANEOUS | 2 refills | Status: DC

## 2023-12-18 NOTE — Assessment & Plan Note (Signed)
 On levothyroxine 50 mcg daily., will recheck labs

## 2023-12-18 NOTE — Assessment & Plan Note (Signed)
 Secondary to type 2 diabetes.   Renal issues influenced the switch to Jardiance for its renal protective effects.

## 2023-12-18 NOTE — Progress Notes (Signed)
 Subjective:  Patient ID: Sarah Bishop, female    DOB: [birth year]  Age: 83 y.o. MRN: [id number]  Chief Complaint  Patient presents with   Medical Management of Chronic Issues    Discussed the use of AI scribe software for clinical note transcription with the patient, who gave verbal consent to proceed.    Sarah Bishop is an 83 year old female with diabetes who presents for a follow-up visit. She is accompanied by her daughter.  She was switched from glipizide to Jardiance 25 mg daily in December 2024. Since the switch, her blood sugar levels have increased from previously being under 150 mg/dL to around 469 mg/dL. She notes that her blood sugar tends to rise when she eats late. She logs her meals and observes consistently higher blood sugar levels since starting Jardiance. She is currently taking Jardiance 25 mg daily for her diabetes. There have been no changes in her diet or other lifestyle factors since the medication switch and she eats poorly and does not move around much.   Her blood pressure is well controlled with amlodipine, and she takes Lasix 40 mg every other day as prescribed. No issues with these medications.  She experiences long-standing right shoulder pain that radiates down her arm, affecting her ability to use a cane for support when navigating stairs or going out. She denies any new pain or symptoms aside from her chronic shoulder pain.  She lives in her daughter's basement and is relatively sedentary, spending most of her time sitting and watching TV. She occasionally walks up the driveway when the weather is warmer. No changes in her diet or physical activity levels.     12/18/2023    8:45 AM 09/12/2023    9:14 AM 09/04/2023    9:28 AM 07/11/2023    2:02 PM 07/11/2023    1:58 PM  Depression screen PHQ 2/9  Decreased Interest 0 0 0 0 0  Down, Depressed, Hopeless 0 0 0 0 0  PHQ - 2 Score 0 0 0 0 0  Altered sleeping  0 0    Tired, decreased energy  0 0    Change  in appetite  0 0    Feeling bad or failure about yourself   0 0    Trouble concentrating  0 0    Moving slowly or fidgety/restless  0 0    Suicidal thoughts  0 0    PHQ-9 Score  0 0    Difficult doing work/chores  Not difficult at all Not difficult at all          12/18/2023    8:46 AM  Fall Risk   Falls in the past year? 0  Number falls in past yr: 1  Injury with Fall? 0  Risk for fall due to : History of fall(s)    Patient Care Team: Windell Moment, MD as PCP - General (Family Medicine) Janalyn Harder, MD (Inactive) as Consulting Physician (Dermatology) Janalyn Harder, MD (Inactive) as Consulting Physician (Dermatology) Van Clines, MD as Consulting Physician (Neurology)   Review of Systems  Constitutional: Negative.  Negative for chills, fatigue and fever.  HENT:  Negative for congestion, ear pain, sinus pressure and sore throat.   Respiratory:  Negative for cough and shortness of breath.   Cardiovascular:  Negative for chest pain.  Gastrointestinal:  Negative for abdominal pain, constipation, diarrhea, nausea and vomiting.  Genitourinary:  Negative for dysuria and frequency.  Musculoskeletal:  Positive for arthralgias (right  shoulder pain). Negative for back pain and myalgias.  Neurological:  Negative for dizziness and headaches.  Psychiatric/Behavioral:  Negative for dysphoric mood. The patient is not nervous/anxious.     Current Outpatient Medications on File Prior to Visit  Medication Sig Dispense Refill   acetaminophen (TYLENOL) 500 MG tablet Take 500-1,000 mg by mouth every 6 (six) hours as needed (pain.).     allopurinol (ZYLOPRIM) 300 MG tablet Take 1 tablet (300 mg total) by mouth daily. 90 tablet 3   amLODipine (NORVASC) 5 MG tablet Take 1 tablet (5 mg total) by mouth at bedtime. 100 tablet 1   atenolol (TENORMIN) 50 MG tablet Take 2 tablets (100 mg total) by mouth at bedtime. 90 tablet 0   atorvastatin (LIPITOR) 80 MG tablet Take 1 tablet (80 mg total) by  mouth every other day. At night 100 tablet 1   cholecalciferol (VITAMIN D3) 25 MCG (1000 UNIT) tablet Take 2 tablets (2,000 Units total) by mouth at bedtime. 60 tablet 1   citalopram (CELEXA) 10 MG tablet Take 1 tablet (10 mg total) by mouth in the morning. 90 tablet 1   divalproex (DEPAKOTE) 250 MG DR tablet Take 750 mg by mouth in the morning.     empagliflozin (JARDIANCE) 25 MG TABS tablet Take 1 tablet (25 mg total) by mouth daily before breakfast. 90 tablet 1   furosemide (LASIX) 20 MG tablet Take 40 mg by mouth in the morning.     Lancets (ONETOUCH DELICA PLUS LANCET33G) MISC APPLY TO AFFECTED AREA TOPICALLY EVERY DAY 100 each 2   levothyroxine (SYNTHROID) 50 MCG tablet Take 1 tablet (50 mcg total) by mouth daily before breakfast. 90 tablet 1   No current facility-administered medications on file prior to visit.   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[facility])   Past Surgical History:  Procedure Laterality Date   BREAST BIOPSY Left    BREAST BIOPSY Left    DILATION AND CURETTAGE OF UTERUS     DILITATION & CURRETTAGE/HYSTROSCOPY WITH VERSAPOINT RESECTION N/A 12/26/2012   Procedure: DILATATION & CURETTAGE/HYSTEROSCOPY WITH VERSAPOINT RESECTION;  Surgeon: Genia Del, MD;  Location: [facility];  Service: Gynecology;  Laterality: N/A;   IR IMAGING GUIDED PORT INSERTION  06/09/2022   IR REMOVAL TUN ACCESS W/ PORT W/O FL MOD SED   12/29/2022   JOINT REPLACEMENT     MANDIBLE FRACTURE SURGERY     ROBOTIC ASSISTED BILATERAL SALPINGO OOPHERECTOMY Bilateral 05/10/2022   Procedure: XI ROBOTIC ASSISTED BILATERAL SALPINGO OOPHORECTOMY, TOTAL HYSTERECTOMY, CYSTOSCOPY;  Surgeon: Carver Fila, MD;  Location: [facility];  Service: Gynecology;  Laterality: Bilateral;   TOTAL KNEE ARTHROPLASTY Bilateral     Family History  Problem Relation Age of Onset   Heart disease Maternal Grandmother    Heart disease Maternal Grandfather    Stroke Maternal Grandfather  Colon cancer Maternal Grandfather        dx 82s   Breast cancer Other    Cancer Half-Brother        in spine   Ovarian cancer Neg Hx    Endometrial cancer Neg Hx    Pancreatic cancer Neg Hx    Prostate cancer Neg Hx    Social History   Socioeconomic History   Marital status: Divorced    Spouse name: Not on file   Number of children: Not on file   Years of education: Not on file   Highest education level: Not on file  Occupational History   Not on file  Tobacco Use   Smoking status: Former    Current packs/day: 0.00    Types: Cigarettes    Quit date: 5    Years since quitting: 26.2    Passive exposure: Past   Smokeless tobacco: Never  Vaping Use   Vaping status: Never Used  Substance and Sexual Activity   Alcohol use: Yes    Comment: rare   Drug use: No   Sexual activity: Not Currently  Other Topics Concern   Not on file  Social History Narrative   ** Merged History Encounter **    Are you right handed or left handed? Right    Are you currently employed ? no   What is your current occupation?na   Do you live at home alone? no   Who lives with you? Lives daughter    What type of home do you live in: 1 story or 2 story?  3 story has a  lift. Lives on main level        Social Drivers of Health   Financial Resource Strain: Low Risk  (09/04/2023)   Overall Financial Resource Strain (CARDIA)    Difficulty of Paying Living Expenses: Not very  hard  Food Insecurity: No Food Insecurity (09/04/2023)   Hunger Vital Sign    Worried About Running Out of Food in the Last Year: Never true    Ran Out of Food in the Last Year: Never true  Transportation Needs: No Transportation Needs (09/04/2023)   PRAPARE - Administrator, Civil Service (Medical): No    Lack of Transportation (Non-Medical): No  Physical Activity: Inactive (09/04/2023)   Exercise Vital Sign    Days of Exercise per Week: 0 days    Minutes of Exercise per Session: 0 min  Stress: No Stress Concern Present (09/04/2023)   [employer] of Occupational Health - Occupational Stress Questionnaire    Feeling of Stress : Not at all  Social Connections: Unknown (09/04/2023)   Social Connection and Isolation Panel [NHANES]    Frequency of Communication with Friends and Family: More than three times a week    Frequency of Social Gatherings with Friends and Family: More than three times a week    Attends Religious Services: Not on Marketing executive or Organizations: Yes    Attends Engineer, structural: Not on file    Marital Status: Divorced    Objective:  BP 110/60   Pulse 63   Temp 97.7 F (36.5 C)   Ht 5\' 3"  (1.6 m)   Wt 229 lb (103.9 kg)   SpO2 98%   BMI 40.57 kg/m      12/18/2023    8:39 AM 09/25/2023    9:30 AM 09/15/2023   10:43 AM  BP/Weight  Systolic BP 110 138 143  Diastolic BP 60 55 50  Wt. (Lbs) 229 229.5 232.4  BMI 40.57 kg/m2 40.65 kg/m2 41.17 kg/m2    Physical Exam Vitals and nursing note reviewed.  Constitutional:      Appearance: She is obese.  HENT:     Head: Normocephalic and atraumatic.  Cardiovascular:     Rate and Rhythm: Normal rate and regular rhythm.  Pulmonary:     Effort: Pulmonary effort is normal.     Breath sounds: Normal breath sounds.  Musculoskeletal:        General: Swelling (trace pedal edema) present.     Comments: ROM right shoulder limited  Neurological:     General: No focal  deficit present.     Mental Status: She is alert.  Psychiatric:        Mood and Affect: Mood normal.     Diabetic Foot Exam - Simple   No data filed      Lab Results  Component Value Date   WBC 10.4 09/25/2023   HGB 12.8 09/25/2023   HCT 38.1 09/25/2023   PLT 170 09/25/2023   GLUCOSE 148 (H) 09/25/2023   ALT 13 09/25/2023   AST 13 (L) 09/25/2023   NA 138 09/25/2023   K 4.2 09/25/2023   CL 99 09/25/2023   CREATININE 1.66 (H) 09/25/2023   BUN 53 (H) 09/25/2023   CO2 29 09/25/2023   INR 1.0 05/13/2008   HGBA1C 6.7 (H) 09/12/2023      Assessment & Plan:  Assessment and Plan       Controlled type 2 diabetes mellitus with complication, without long-term current use of insulin (HCC) Assessment & Plan: Blood glucose levels have increased to approximately 190 mg/dL since switching from glipizide to Jardiance in December, previously controlled under 150 mg/dL. The switch was due to Jardiance's renal protective benefits, given her kidney issues. Discussed adding a GLP-1 receptor agonist, such as Ozempic, for better glycemic control and weight management. Potential side effects, including gastrointestinal upset, were reviewed. She is hesitant about injections but agreed to try if insurance covers it. - Order liver function tests, lipid panel, and A1c - Prescribe Ozempic and send to pharmacy - Monitor blood glucose levels and adjust medication as needed - Encourage dietary modifications and increased physical activity  Orders: -     CBC with Differential/Platelet -     Comprehensive metabolic panel -     Lipid panel -     Hemoglobin A1c  Stage 3b chronic kidney disease (HCC) Assessment & Plan: Secondary to type 2 diabetes.   Renal issues influenced the switch to Jardiance for its renal protective effects.  Orders: -     CBC with Differential/Platelet -     Comprehensive metabolic panel -     Lipid panel -     Hemoglobin A1c  Obesity, Class III, BMI 40-49.9  (morbid obesity) (HCC) Assessment & Plan: Very sedentary lifestyle.  Encouraged movement and activity Advised to set timer and get up every 30 mins and move for 5 minutes Hopefully ozempic will get covered for her diabetes and will help with her weight   Hypertension, unspecified type Assessment & Plan: Blood pressure is well-controlled with amlodipine 5 mg daily and Lasix 20 mg every other day.   Dyslipidemia due to type 2 diabetes mellitus (HCC) Assessment & Plan: On Lipitor 80 mg daily   Acquired hypothyroidism Assessment & Plan: On levothyroxine 50 mcg daily., will recheck labs  Orders: -     TSH  Chronic right  shoulder pain Assessment & Plan: Reports long-standing right shoulder pain, likely due to arthritis and rotator cuff issues. She does have osteoarthritis and history of rheumatoid arthritis.  Uses a cane, which may contribute to the pain. Declined physical therapy but advised to perform shoulder strengthening exercises at home. - Recommend shoulder strengthening exercises at home - Consider physical therapy if no improvement with exercises but she declined at this time   General Health Maintenance Vaccinations reviewed. Last tetanus shot in 2017. Up to date with pneumococcal and flu vaccines. RSV vaccine not available at clinic but obtainable at a pharmacy. - Advise obtaining RSV vaccine from a pharmacy if desired  Follow-up Return for follow-up in three months unless issues arise sooner. - Schedule follow-up appointment in three months    Other orders -     Semaglutide(0.25 or 0.5MG /DOS); Inject 0.25 mg into the skin once a week.  Dispense: 3 mL; Refill: 2     Meds ordered this encounter  Medications   Semaglutide,0.25 or 0.5MG /DOS, 2 MG/3ML SOPN    Sig: Inject 0.25 mg into the skin once a week.    Dispense:  3 mL    Refill:  2    Patient has TYPE 2 Diabetes, A1c 6.7 in December 2024    Orders Placed This Encounter  Procedures   CBC with  Differential   Comprehensive metabolic panel   Lipid Panel   Hemoglobin A1c   TSH     Follow-up: Return in about 3 months (around 03/19/2024) for chronic disease follow up.  An After Visit Summary was printed and given to the patient.  Windell Moment, MD [facility] [phone]

## 2023-12-18 NOTE — Assessment & Plan Note (Signed)
 Blood pressure is well-controlled with amlodipine 5 mg daily and Lasix 20 mg every other day.

## 2023-12-18 NOTE — Assessment & Plan Note (Addendum)
 Very sedentary lifestyle.  Encouraged movement and activity Advised to set timer and get up every 30 mins and move for 5 minutes Hopefully ozempic will get covered for her diabetes and will help with her weight

## 2023-12-18 NOTE — Assessment & Plan Note (Addendum)
 Reports long-standing right shoulder pain, likely due to arthritis and rotator cuff issues. She does have osteoarthritis and history of rheumatoid arthritis.  Uses a cane, which may contribute to the pain. Declined physical therapy but advised to perform shoulder strengthening exercises at home. - Recommend shoulder strengthening exercises at home - Consider physical therapy if no improvement with exercises but she declined at this time   General Health Maintenance Vaccinations reviewed. Last tetanus shot in 2017. Up to date with pneumococcal and flu vaccines. RSV vaccine not available at clinic but obtainable at a pharmacy. - Advise obtaining RSV vaccine from a pharmacy if desired  Follow-up Return for follow-up in three months unless issues arise sooner. - Schedule follow-up appointment in three months

## 2023-12-18 NOTE — Assessment & Plan Note (Signed)
 On Lipitor 80 mg daily.

## 2023-12-18 NOTE — Assessment & Plan Note (Signed)
 Blood glucose levels have increased to approximately 190 mg/dL since switching from glipizide to Jardiance in December, previously controlled under 150 mg/dL. The switch was due to Jardiance's renal protective benefits, given her kidney issues. Discussed adding a GLP-1 receptor agonist, such as Ozempic, for better glycemic control and weight management. Potential side effects, including gastrointestinal upset, were reviewed. She is hesitant about injections but agreed to try if insurance covers it. - Order liver function tests, lipid panel, and A1c - Prescribe Ozempic and send to pharmacy - Monitor blood glucose levels and adjust medication as needed - Encourage dietary modifications and increased physical activity

## 2023-12-18 NOTE — Patient Instructions (Signed)
 VISIT SUMMARY:  Sarah Bishop, an 83 year old female with diabetes, came in for a follow-up visit. Her blood sugar levels have increased since switching from glipizide to Jardiance. She also experiences chronic right shoulder pain and has well-controlled blood pressure. She lives a relatively sedentary lifestyle and has no changes in diet or physical activity.  YOUR PLAN:  -TYPE 2 DIABETES MELLITUS: Type 2 Diabetes Mellitus is a condition where the body does not use insulin properly, leading to high blood sugar levels. Since switching to Jardiance, your blood sugar levels have increased. We discussed adding Ozempic, which can help control blood sugar and manage weight. We will order liver function tests, a lipid panel, and an A1c test. Please monitor your blood sugar levels and we will adjust your medication as needed. Additionally, try to make some dietary changes and increase your physical activity.  -CHRONIC KIDNEY DISEASE: Chronic Kidney Disease is a condition where the kidneys gradually lose function. Sarah Bishop was chosen for its benefits in protecting your kidneys.  -HYPERTENSION: Hypertension is high blood pressure. Your blood pressure is well-controlled with your current medications, amlodipine and Lasix.  -SHOULDER PAIN: Your long-standing right shoulder pain is likely due to arthritis and rotator cuff issues. You use a cane, which may contribute to the pain. Although you declined physical therapy, please try shoulder strengthening exercises at home. If there is no improvement, consider physical therapy.  -GENERAL HEALTH MAINTENANCE: Your vaccinations are up to date except for the RSV vaccine, which is not available at our clinic but can be obtained at a pharmacy.  INSTRUCTIONS:  Please return for a follow-up appointment in three months unless any issues arise sooner. Schedule your follow-up appointment accordingly.

## 2023-12-19 LAB — LIPID PANEL
Chol/HDL Ratio: 3.4 ratio (ref 0.0–4.4)
Cholesterol, Total: 147 mg/dL (ref 100–199)
HDL: 43 mg/dL (ref >39)
LDL Chol Calc (NIH): 79 mg/dL (ref 0–99)
Triglycerides: 141 mg/dL (ref 0–149)
VLDL Cholesterol Cal: 25 mg/dL (ref 5–40)

## 2023-12-19 LAB — COMPREHENSIVE METABOLIC PANEL
ALT: 19 IU/L (ref 0–32)
AST: 18 IU/L (ref 0–40)
Albumin: 4 g/dL (ref 3.7–4.7)
Alkaline Phosphatase: 107 IU/L (ref 44–121)
BUN/Creatinine Ratio: 37 — ABNORMAL HIGH (ref 12–28)
BUN: 58 mg/dL — ABNORMAL HIGH (ref 8–27)
Bilirubin Total: 0.3 mg/dL (ref 0.0–1.2)
CO2: 22 mmol/L (ref 20–29)
Calcium: 9.9 mg/dL (ref 8.7–10.3)
Chloride: 97 mmol/L (ref 96–106)
Creatinine, Ser: 1.56 mg/dL — ABNORMAL HIGH (ref 0.57–1.00)
Globulin, Total: 2.7 g/dL (ref 1.5–4.5)
Glucose: 205 mg/dL — ABNORMAL HIGH (ref 70–99)
Potassium: 4.9 mmol/L (ref 3.5–5.2)
Sodium: 138 mmol/L (ref 134–144)
Total Protein: 6.7 g/dL (ref 6.0–8.5)
eGFR: 33 mL/min/{1.73_m2} — ABNORMAL LOW (ref >59)

## 2023-12-19 LAB — CBC WITH DIFFERENTIAL/PLATELET
Basophils Absolute: 0.1 10*3/uL (ref 0.0–0.2)
Basos: 1 %
EOS (ABSOLUTE): 0.2 10*3/uL (ref 0.0–0.4)
Eos: 2 %
Hematocrit: 38.8 % (ref 34.0–46.6)
Hemoglobin: 13.1 g/dL (ref 11.1–15.9)
Immature Grans (Abs): 0.1 10*3/uL (ref 0.0–0.1)
Immature Granulocytes: 1 %
Lymphocytes Absolute: 2.2 10*3/uL (ref 0.7–3.1)
Lymphs: 23 %
MCH: 31.3 pg (ref 26.6–33.0)
MCHC: 33.8 g/dL (ref 31.5–35.7)
MCV: 93 fL (ref 79–97)
Monocytes Absolute: 0.7 10*3/uL (ref 0.1–0.9)
Monocytes: 7 %
Neutrophils Absolute: 6.4 10*3/uL (ref 1.4–7.0)
Neutrophils: 66 %
Platelets: 207 10*3/uL (ref 150–450)
RBC: 4.19 x10E6/uL (ref 3.77–5.28)
RDW: 14.4 % (ref 11.7–15.4)
WBC: 9.7 10*3/uL (ref 3.4–10.8)

## 2023-12-19 LAB — HEMOGLOBIN A1C
Est. average glucose Bld gHb Est-mCnc: 180 mg/dL
Hgb A1c MFr Bld: 7.9 % — ABNORMAL HIGH (ref 4.8–5.6)

## 2023-12-19 LAB — TSH: TSH: 4.77 u[IU]/mL — ABNORMAL HIGH (ref 0.450–4.500)

## 2023-12-22 ENCOUNTER — Other Ambulatory Visit: Payer: Self-pay

## 2023-12-22 DIAGNOSIS — R7989 Other specified abnormal findings of blood chemistry: Secondary | ICD-10-CM

## 2023-12-22 MED ORDER — LEVOTHYROXINE SODIUM 75 MCG PO TABS: 75.0000 ug | ORAL_TABLET | Freq: Every day | ORAL | 0 refills | Status: DC

## 2023-12-26 ENCOUNTER — Other Ambulatory Visit: Payer: Self-pay

## 2024-01-06 ENCOUNTER — Other Ambulatory Visit: Payer: Self-pay

## 2024-01-19 ENCOUNTER — Encounter: Payer: Self-pay | Admitting: Gynecologic Oncology

## 2024-01-19 ENCOUNTER — Inpatient Hospital Stay: Payer: Medicare Other | Attending: Gynecologic Oncology

## 2024-01-19 ENCOUNTER — Inpatient Hospital Stay: Payer: Medicare Other | Admitting: Gynecologic Oncology

## 2024-01-19 VITALS — BP 129/56 | HR 62 | Temp 97.9°F | Resp 16 | Ht 63.0 in | Wt 222.0 lb

## 2024-01-19 DIAGNOSIS — Z8544 Personal history of malignant neoplasm of other female genital organs: Secondary | ICD-10-CM

## 2024-01-19 DIAGNOSIS — Z90710 Acquired absence of both cervix and uterus: Secondary | ICD-10-CM | POA: Insufficient documentation

## 2024-01-19 DIAGNOSIS — Z9079 Acquired absence of other genital organ(s): Secondary | ICD-10-CM | POA: Insufficient documentation

## 2024-01-19 DIAGNOSIS — Z90722 Acquired absence of ovaries, bilateral: Secondary | ICD-10-CM | POA: Insufficient documentation

## 2024-01-19 DIAGNOSIS — C5702 Malignant neoplasm of left fallopian tube: Secondary | ICD-10-CM

## 2024-01-19 DIAGNOSIS — Z08 Encounter for follow-up examination after completed treatment for malignant neoplasm: Secondary | ICD-10-CM | POA: Insufficient documentation

## 2024-01-19 DIAGNOSIS — Z9221 Personal history of antineoplastic chemotherapy: Secondary | ICD-10-CM | POA: Insufficient documentation

## 2024-01-19 LAB — CBC WITH DIFFERENTIAL/PLATELET
Abs Immature Granulocytes: 0.15 10*3/uL — ABNORMAL HIGH (ref 0.00–0.07)
Basophils Absolute: 0.1 10*3/uL (ref 0.0–0.1)
Basophils Relative: 1 %
Eosinophils Absolute: 0.2 10*3/uL (ref 0.0–0.5)
Eosinophils Relative: 2 %
HCT: 39.4 % (ref 36.0–46.0)
Hemoglobin: 13.3 g/dL (ref 12.0–15.0)
Immature Granulocytes: 1 %
Lymphocytes Relative: 17 %
Lymphs Abs: 2.3 10*3/uL (ref 0.7–4.0)
MCH: 30.9 pg (ref 26.0–34.0)
MCHC: 33.8 g/dL (ref 30.0–36.0)
MCV: 91.4 fL (ref 80.0–100.0)
Monocytes Absolute: 0.7 10*3/uL (ref 0.1–1.0)
Monocytes Relative: 5 %
Neutro Abs: 9.8 10*3/uL — ABNORMAL HIGH (ref 1.7–7.7)
Neutrophils Relative %: 74 %
Platelets: 234 10*3/uL (ref 150–400)
RBC: 4.31 MIL/uL (ref 3.87–5.11)
RDW: 14.6 % (ref 11.5–15.5)
WBC: 13.2 10*3/uL — ABNORMAL HIGH (ref 4.0–10.5)
nRBC: 0 % (ref 0.0–0.2)

## 2024-01-19 LAB — COMPREHENSIVE METABOLIC PANEL WITH GFR
ALT: 16 U/L (ref 0–44)
AST: 17 U/L (ref 15–41)
Albumin: 4.2 g/dL (ref 3.5–5.0)
Alkaline Phosphatase: 91 U/L (ref 38–126)
Anion gap: 9 (ref 5–15)
BUN: 58 mg/dL — ABNORMAL HIGH (ref 8–23)
CO2: 28 mmol/L (ref 22–32)
Calcium: 10.4 mg/dL — ABNORMAL HIGH (ref 8.9–10.3)
Chloride: 101 mmol/L (ref 98–111)
Creatinine, Ser: 1.65 mg/dL — ABNORMAL HIGH (ref 0.44–1.00)
GFR, Estimated: 31 mL/min — ABNORMAL LOW (ref >60)
Glucose, Bld: 129 mg/dL — ABNORMAL HIGH (ref 70–99)
Potassium: 3.9 mmol/L (ref 3.5–5.1)
Sodium: 138 mmol/L (ref 135–145)
Total Bilirubin: 0.5 mg/dL (ref 0.0–1.2)
Total Protein: 7.5 g/dL (ref 6.5–8.1)

## 2024-01-19 NOTE — Progress Notes (Signed)
 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  No significant cystic component or ascites.  Renal lesion of the right kidney lower pole is thought to represent a benign lesion.  1.3 cm cystic lesion of the head of the pancreas is recommended to be follow-up with pancreatic MRI in 2 years.   05/02/2022 Tumor Marker   Patient's tumor was tested for the following markers: CA-125. Results of the tumor marker test revealed 69.   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 Fullness appreciated within the left cul de sac. On intra-abdominal entry, normal upper abdominal survey including liver edge, diaphragm, and stomach. Normal appearing omentum. No ascites. Normal appearing small bowel. Significant inflammation within the pelvis concerning initially for recent diverticulitis versus endometriosis. Thickened and inflamed pelvic peritoneum. Filmy adhesions between the anterior cul de sac and uterus, between the right adnexa and and the sigmoid colon. Right adnexa inflammed but otherwise normal in appearing. Left fallopian tube dilated with evidence of hydrosalpinx. Friable adhesions of the fallopian tube to the sigmoid mesentery, left ovary and left broad ligament. No ascites. Uterus bulbous and 10cm with findings of endosalpingiosis versus  endometriosis on much of the uterine serosa. Dense adhesions between the bladder and cervix. Cul de sac without evidence of disease. No ascites.  Cystoscopy findings: bladder dome intact, normal efflux from bilateral ureteral orifices. On frozen section, no abnormality of the endometrium. Anterior cul de sac adhesion with findings of hemosiderin, no definitive malignancy. Fallopian tube with malignancy.  Given findings and pelvic peritoneal involvement given fallopian tube adherent to surrounding structures, no upper abdominal findings, normal omentum, and normal neuroassessment intraoperatively as well as on preoperative imaging, lymphadenectomy deferred.   05/10/2022 Pathology Results   Procedure: Hysterectomy with bilateral salpingo-oophorectomy  Specimen Integrity: Intact  Tumor Site: Left fallopian tube  Tumor Size: 6.3 x 3.8 x 2.8 cm  Histologic Type: Serous carcinoma  Histologic Grade: High-grade  Ovarian Surface Involvement: Not identified  Fallopian Tube Surface Involvement: Not identified  Implants (required for advanced stage serous/seromucinous borderline  tumors only): Not applicable  Other Tissue/ Organ Involvement: Not applicable  Largest Extrapelvic Peritoneal Focus: Not applicable  Peritoneal/Ascitic Fluid Involvement: Not identified [facility] [phone])  Chemotherapy Response Score (CRS): Not applicable, no known presurgical  therapy  Regional Lymph Nodes: Not applicable (no lymph nodes submitted or found)  Distant Metastasis: Not applicable  Pathologic Stage Classification (pTNM, AJCC 8th Edition): pT1a, pN n/a  Ancillary Studies: Available upon request  Representative Tumor Block: B15  Comment(s): An immunohistochemistry stain for p53 is performed with  adequate control and is diffusely positive within the tumor.  The same  stain shows patchy variable staining within the reactive mesothelium  lining the serosal adhesions within the anterior cul-de-sac    05/17/2022 Initial  Diagnosis   Fallopian tube cancer, carcinoma, left (HCC)   05/30/2022 Cancer Staging   Staging form: Ovary, Fallopian Tube, and Primary Peritoneal Carcinoma,  AJCC 8th Edition - Pathologic stage from 05/30/2022: FIGO Stage IIB (pT2b, pN0, cM0) - Signed by Almeda Jacobs, MD on 05/30/2022 Stage prefix: Initial diagnosis   06/10/2022 - 11/11/2022 Chemotherapy   Patient is on Treatment Plan : OVARIAN Carboplatin  (AUC 6) + Paclitaxel  (175) q21d X 6 Cycles     06/10/2022 Procedure   Placement of a subcutaneous power-injectable port device. Catheter tip at the superior cavoatrial junction.      Genetic Testing   Negative genetic testing. No pathogenic variants identified on the [facility] Multi-Cancer+RNA panel. The report date is 09/06/2022.   The Multi-Cancer + RNA Panel offered by [facility] includes sequencing and/or deletion/duplication analysis of the following 70 genes:  AIP*, ALK, APC*, ATM*, AXIN2*, BAP1*, BARD1*, BLM*, BMPR1A*, BRCA1*, BRCA2*, BRIP1*, CDC73*, CDH1*, CDK4, CDKN1B*, CDKN2A, CHEK2*, CTNNA1*, DICER1*, EPCAM, EGFR, FH*, FLCN*, GREM1, HOXB13, KIT, LZTR1, MAX*, MBD4, MEN1*, MET, MITF, MLH1*, MSH2*, MSH3*, MSH6*, MUTYH*, NF1*, NF2*, NTHL1*, PALB2*, PDGFRA, PMS2*, POLD1*, POLE*, POT1*, PRKAR1A*, PTCH1*, PTEN*, RAD51C*, RAD51D*, RB1*, RET, SDHA*, SDHAF2*, SDHB*, SDHC*, SDHD*, SMAD4*, SMARCA4*, SMARCB1*, SMARCE1*, STK11*, SUFU*, TMEM127*, TP53*, TSC1*, TSC2*, VHL*. RNA analysis is performed for * genes.   11/14/2022 Tumor Marker   Patient's tumor was tested for the following markers: CA-125. Results of the tumor marker test revealed 13.9.   12/12/2022 Imaging   1. No findings in the pelvis to suggest local recurrence. No evidence for metastatic disease in the abdomen or pelvis. 2. The cystic pancreatic head lesion seen on MRI is not well demonstrated by CT but measures approximately 10 mm. Continued attention on restaging scans recommended. Previous MRI recommended 2 year interval MRI follow-up. 3.  Cholelithiasis. 4.  Aortic Atherosclerosis (ICD10-I70.0).   01/02/2023 Procedure   Patient had successful removal of port    03/23/2023 Tumor Marker   Patient's tumor was tested for the following markers: CA-125. Results of the tumor marker test revealed 12.1.   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 Restaging assessment. * Tracking Code: BO * EXAM: CT ABDOMEN AND PELVIS WITHOUT CONTRAST TECHNIQUE: Multidetector CT imaging of the abdomen and pelvis was performed following the standard protocol without IV contrast. RADIATION DOSE REDUCTION: This exam was performed according to the departmental dose-optimization program which includes automated exposure control, adjustment of the mA and/or kV according to patient size and/or use of iterative reconstruction technique. COMPARISON:  12/09/2022 FINDINGS: Lower chest: Coronary and descending thoracic aortic atheromatous vascular disease. Mitral valve calcification. Hepatobiliary: Small depending gallstones in the gallbladder. No biliary dilatation. Otherwise unremarkable. Pancreas: The cystic lesion along the uncinate process previously seen on MRI is very poorly appreciable on CT today but appears roughly stable at about 0.9 cm on image 34 series 2. Spleen: Unremarkable Adrenals/Urinary Tract: Cysts of varying complexity as shown on MRI from 03/29/2022, these have been previously shown to be benign and require no further workup. Bilateral renal atrophy. Adrenal glands normal. Urinary bladder empty. Stomach/Bowel: Sigmoid colon diverticulosis. Chronic mild prominence of the appendix especially centrally or the appendiceal diameter is up to 0.9 cm on image 50 of series 2, this is been present at least since 07/13/2004 and is presumably incidental given the long-chronicity. Vascular/Lymphatic: Atherosclerosis is  present, including aortoiliac atherosclerotic disease. Atheromatous vascular calcification proximally in the superior mesenteric artery noted. No pathologic adenopathy. Reproductive: Uterus absent.  Adnexa unremarkable. Other: No supplemental non-categorized findings. Musculoskeletal: Lumbar spondylosis and degenerative  disc disease causing mild multilevel impingement. 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 IMPRESSION: 1. No findings of active malignancy. 2. Aortic atherosclerosis. 3. Cholelithiasis. 4. Sigmoid colon diverticulosis. 5. Chronic mild prominence of the appendix especially centrally, this has been present at least since 07/13/2004 and is presumably incidental given the long-chronicity. 6. 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 7. Lumbar spondylosis and degenerative disc disease causing mild multilevel impingement. 8. The cystic lesion along the uncinate process of the pancreas previously seen on MRI is very poorly appreciable on CT today but appears roughly stable at about 0.9 cm. Aortic Atherosclerosis (ICD10-I70.0). Electronically Signed   By: Freida Jes M.D.   On: 06/14/2023 16:12      09/28/2023 Tumor Marker   Patient's tumor was tested for the following markers: CA-125. Results of the tumor marker test revealed 14.3.     Interval History: Doing well.  Denies any abdominal or pelvic pain.  Denies vaginal bleeding.  Reports baseline bowel bladder function.  Has had a cough now, multiple family members recently sick with similar symptoms.  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[facility])    Past Surgical History:  Procedure Laterality Date   BREAST BIOPSY Left    BREAST BIOPSY Left    DILATION AND CURETTAGE OF UTERUS     DILITATION & CURRETTAGE/HYSTROSCOPY WITH VERSAPOINT RESECTION N/A 12/26/2012   Procedure: DILATATION & CURETTAGE/HYSTEROSCOPY WITH VERSAPOINT RESECTION;  Surgeon: Percy Bracken, MD;  Location: [facility];  Service: Gynecology;  Laterality: N/A;   IR IMAGING GUIDED PORT INSERTION  06/09/2022   IR REMOVAL TUN ACCESS W/ PORT W/O FL MOD SED  12/29/2022   JOINT REPLACEMENT     MANDIBLE FRACTURE SURGERY     ROBOTIC ASSISTED BILATERAL SALPINGO OOPHERECTOMY Bilateral 05/10/2022   Procedure: XI ROBOTIC ASSISTED BILATERAL SALPINGO OOPHORECTOMY, TOTAL HYSTERECTOMY, CYSTOSCOPY;  Surgeon: Suzi Essex, MD;  Location: [facility];  Service: Gynecology;  Laterality: Bilateral;   TOTAL KNEE ARTHROPLASTY Bilateral     Family History  Problem Relation Age of Onset   Heart disease Maternal Grandmother    Heart disease Maternal Grandfather    Stroke Maternal Grandfather    Colon cancer Maternal Grandfather        dx 46s   Breast cancer Other    Cancer Half-Brother        in spine   Ovarian cancer Neg Hx    Endometrial cancer Neg Hx    Pancreatic cancer Neg Hx    Prostate cancer Neg Hx     Social History   Socioeconomic History   Marital status: Divorced    Spouse name: Not on file   Number of children: Not on file   Years of education: Not on file   Highest education level: Not on  file  Occupational History   Not on file  Tobacco Use   Smoking status: Former    Current packs/day: 0.00    Types: Cigarettes    Quit date: 1999    Years since quitting: 26.3    Passive exposure: Past    Smokeless tobacco: Never  Vaping Use   Vaping status: Never Used  Substance and Sexual Activity   Alcohol use: Yes    Comment: rare   Drug use: No   Sexual activity: Not Currently  Other Topics Concern   Not on file  Social History Narrative   ** Merged History Encounter **    Are you right handed or left handed? Right    Are you currently employed ? no   What is your current occupation?na   Do you live at home alone? no   Who lives with you? Lives daughter    What type of home do you live in: 1 story or 2 story?  3 story has a  lift. Lives on main level        Social Drivers of Health   Financial Resource Strain: Low Risk  (09/04/2023)   Overall Financial Resource Strain (CARDIA)    Difficulty of Paying Living Expenses: Not very hard  Food Insecurity: No Food Insecurity (09/04/2023)   Hunger Vital Sign    Worried About Running Out of Food in the Last Year: Never true    Ran Out of Food in the Last Year: Never true  Transportation Needs: No Transportation Needs (09/04/2023)   PRAPARE - Administrator, Civil Service (Medical): No    Lack of Transportation (Non-Medical): No  Physical Activity: Inactive (09/04/2023)   Exercise Vital Sign    Days of Exercise per Week: 0 days    Minutes of Exercise per Session: 0 min  Stress: No Stress Concern Present (09/04/2023)   [employer] of Occupational Health - Occupational Stress Questionnaire    Feeling of Stress : Not at all  Social Connections: Unknown (09/04/2023)   Social Connection and Isolation Panel [NHANES]    Frequency of Communication with Friends and Family: More than three times a week    Frequency of Social Gatherings with Friends and Family: More than three times a week    Attends Religious Services: Not on Marketing executive or Organizations: Yes    Attends Banker Meetings: Not on file    Marital Status: Divorced    Current Medications:  Current Outpatient Medications:     acetaminophen  (TYLENOL ) 500 MG tablet, Take 500-1,000 mg by mouth every 6 (six) hours as needed (pain.)., Disp: , Rfl:    allopurinol  (ZYLOPRIM ) 300 MG tablet, Take 1 tablet (300 mg total) by mouth daily., Disp: 90 tablet, Rfl: 3   amLODipine  (NORVASC ) 5 MG tablet, Take 1 tablet (5 mg total) by mouth at bedtime., Disp: 100 tablet, Rfl: 1   atenolol  (TENORMIN ) 50 MG tablet, TAKE 2 TABLETS BY MOUTH AT BEDTIME., Disp: 180 tablet, Rfl: 1   atorvastatin  (LIPITOR) 80 MG tablet, Take 1 tablet (80 mg total) by mouth every other day. At night, Disp: 100 tablet, Rfl: 1   cholecalciferol (VITAMIN D3) 25 MCG (1000 UNIT) tablet, Take 2 tablets (2,000 Units total) by mouth at bedtime., Disp: 60 tablet, Rfl: 1   citalopram  (CELEXA ) 10 MG tablet, Take 1 tablet (10 mg total) by mouth in the morning., Disp: 90 tablet, Rfl: 1   divalproex (DEPAKOTE) 250 MG  DR tablet, Take 750 mg by mouth in the morning., Disp: , Rfl:    empagliflozin  (JARDIANCE ) 25 MG TABS tablet, Take 1 tablet (25 mg total) by mouth daily before breakfast., Disp: 90 tablet, Rfl: 1   furosemide (LASIX) 20 MG tablet, Take 40 mg by mouth in the morning., Disp: , Rfl:    JARDIANCE  10 MG TABS tablet, TAKE 1 TABLET BY MOUTH DAILY  BEFORE BREAKFAST, Disp: 100 tablet, Rfl: 2   Lancets (ONETOUCH DELICA PLUS LANCET33G) MISC, APPLY TO AFFECTED AREA TOPICALLY EVERY DAY, Disp: 100 each, Rfl: 2   levothyroxine  (SYNTHROID ) 75 MCG tablet, Take 1 tablet (75 mcg total) by mouth daily., Disp: 90 tablet, Rfl: 0   Semaglutide ,0.25 or 0.5MG /DOS, 2 MG/3ML SOPN, Inject 0.25 mg into the skin once a week., Disp: 3 mL, Rfl: 2  Review of Systems: + cough Denies appetite changes, fevers, chills, fatigue, unexplained weight changes. Denies hearing loss, neck lumps or masses, mouth sores, ringing in ears or voice changes. Denies wheezing.  Denies shortness of breath. Denies chest pain or palpitations. Denies leg swelling. Denies abdominal distention, pain, blood in stools,  constipation, diarrhea, nausea, vomiting, or early satiety. Denies pain with intercourse, dysuria, frequency, hematuria or incontinence. Denies hot flashes, pelvic pain, vaginal bleeding or vaginal discharge.   Denies joint pain, back pain or muscle pain/cramps. Denies itching, rash, or wounds. Denies dizziness, headaches, numbness or seizures. Denies swollen lymph nodes or glands, denies easy bruising or bleeding. Denies anxiety, depression, confusion, or decreased concentration.  Physical Exam: BP (!) 129/56 (BP Location: Left Arm, Patient Position: Sitting) Comment: CMA notified  Pulse 62   Temp 97.9 F (36.6 C) (Oral)   Resp 16   Ht 5\' 3"  (1.6 m)   Wt 222 lb (100.7 kg)   SpO2 95%   BMI 39.33 kg/m  General: Alert, oriented, no acute distress. HEENT: Normocephalic, atraumatic, sclera anicteric. Chest: Clear to auscultation bilaterally.  No wheezes or rhonchi. Cardiovascular: regular rate and rhythm, no murmurs. Abdomen: Obese, soft, nontender.  Normoactive bowel sounds.  No masses or hepatosplenomegaly appreciated.  Well-healed incisions. Extremities: Grossly normal range of motion.  Warm, well perfused.  Trace edema bilaterally. Skin: No rashes or lesions noted. Lymphatics: No cervical, supraclavicular, or inguinal adenopathy. GU: Normal appearing external genitalia without erythema, excoriation, or lesions.  Speculum exam reveals mildly atrophic vaginal mucosa, no lesions, cuff intact.  Bimanual exam reveals no masses or nodularity.  Rectovaginal exam confirms these findings.  Laboratory & Radiologic Studies: Component Ref Range & Units (hover) 3 mo ago (09/25/23) 7 mo ago (06/13/23) 10 mo ago (03/21/23) 1 yr ago (11/11/22) 1 yr ago (05/02/22)  Cancer Antigen (CA) 125 14.3 14.1 CM 12.1 CM 13.9 CM 69.0 High  CM    Assessment & Plan: Sarah Bishop is a 83 y.o. 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 PDS 05/2022.  Completed adj chemotherapy 11/2022. Negative germline. HRD negative. HER2 1+. Last imaging 06/2023, no findings of recurrent disease.   Doing well, NED on exam today. CA125 drawn today.   Per NCCN surveillance recommendations, we will continue with visits every 2-4 months.  The patient is scheduled to see Dr. Marton Sleeper in September 2025.  I will see her for follow-up in July.  We reviewed signs and symptoms that should prompt a phone call before her next visit.  20 minutes of total time was spent for this patient encounter, including preparation, face-to-face counseling with the patient and  coordination of care, and documentation of the encounter.  Wiley Hanger, MD  Division of Gynecologic Oncology  Department of Obstetrics and Gynecology  [facility]

## 2024-01-19 NOTE — Patient Instructions (Signed)
 It was good to see you today.  I do not see or feel any evidence of cancer recurrence on your exam.  I will see you for follow-up in 3 months.  As always, if you develop any new and concerning symptoms before your next visit, please call to see me sooner.

## 2024-01-20 LAB — CA 125: Cancer Antigen (CA) 125: 11.3 U/mL (ref 0.0–38.1)

## 2024-01-25 ENCOUNTER — Ambulatory Visit: Payer: Medicare Other | Admitting: Gynecologic Oncology

## 2024-01-25 ENCOUNTER — Other Ambulatory Visit: Payer: Medicare Other

## 2024-01-27 ENCOUNTER — Other Ambulatory Visit: Payer: Self-pay

## 2024-02-12 ENCOUNTER — Other Ambulatory Visit: Payer: Self-pay

## 2024-03-21 ENCOUNTER — Ambulatory Visit (INDEPENDENT_AMBULATORY_CARE_PROVIDER_SITE_OTHER)

## 2024-03-21 VITALS — BP 120/58 | HR 66 | Temp 97.6°F | Resp 18 | Ht 63.0 in | Wt 226.0 lb

## 2024-03-21 DIAGNOSIS — E1169 Type 2 diabetes mellitus with other specified complication: Secondary | ICD-10-CM | POA: Diagnosis not present

## 2024-03-21 DIAGNOSIS — I10 Essential (primary) hypertension: Secondary | ICD-10-CM

## 2024-03-21 DIAGNOSIS — E118 Type 2 diabetes mellitus with unspecified complications: Secondary | ICD-10-CM | POA: Diagnosis not present

## 2024-03-21 DIAGNOSIS — Z8669 Personal history of other diseases of the nervous system and sense organs: Secondary | ICD-10-CM

## 2024-03-21 DIAGNOSIS — E785 Hyperlipidemia, unspecified: Secondary | ICD-10-CM

## 2024-03-21 DIAGNOSIS — E039 Hypothyroidism, unspecified: Secondary | ICD-10-CM

## 2024-03-21 DIAGNOSIS — D61818 Other pancytopenia: Secondary | ICD-10-CM

## 2024-03-21 MED ORDER — OZEMPIC (0.25 OR 0.5 MG/DOSE) 2 MG/3ML ~~LOC~~ SOPN: 0.5000 mg | PEN_INJECTOR | SUBCUTANEOUS | 2 refills | Status: DC

## 2024-03-21 NOTE — Assessment & Plan Note (Addendum)
 Last seizure in 1994. No current seizures or medication. Neurologist follow-up scheduled in July. Diagnosis changed to history due to long-term seizure-free status. Seizures likely hormonally driven and ceased post-menopause.    General Health Maintenance Discussion of tetanus vaccination and bone density screening. Discussed potential outcomes of bone density test and treatment options if osteoporosis is diagnosed. - Check insurance coverage for tetanus shot and administer if covered. - Order bone density test. - Advise on calcium  and vitamin D  supplementation and strength training exercises.  Follow-up Plans discussed for various conditions and general health maintenance. - Follow up in 3 months. - Schedule neurologist appointment in July. - Discuss allopurinol  dosage with nephrologist in upcoming appointment.

## 2024-03-21 NOTE — Assessment & Plan Note (Signed)
Resolved at this time. Will continue to monitor

## 2024-03-21 NOTE — Assessment & Plan Note (Signed)
 Managed with amlodipine , atenolol , and furosemide. Blood pressure at 120/58. Potential dose adjustment for amlodipine  due to kidney function. Advised to monitor sodium intake and increase physical activity. - Continue current antihypertensive regimen. - Advise on lifestyle modifications for blood pressure and edema management.

## 2024-03-21 NOTE — Progress Notes (Signed)
 Subjective:  Patient ID: Sarah Bishop, female    DOB: [birth year]  Age: 83 y.o. MRN: [id number]  Chief Complaint  Patient presents with   Medical Management of Chronic Issues    HPI: Discussed the use of AI scribe software for clinical note transcription with the patient, who gave verbal consent to proceed.  History of Present Illness   Sarah Bishop is an 83 year old female who presents for a routine follow-up visit. She is accompanied by her daughter.  She has a history of epilepsy, with her last seizure occurring in 1994. She is not currently on any medication for epilepsy and sees a neurologist regularly. An EEG showed no seizure activity.  She has a history of rheumatoid arthritis but has not experienced symptoms in a long time. She had knee replacements and currently experiences knee pain, which she attributes to lack of use. She is not on any medication for rheumatoid arthritis. No current joint pain except in her knees.  Two months ago, she had an episode of acute bronchitis, for which she was treated at urgent care. She used an inhaler during this time but does not have chronic bronchitis. No chronic bronchitis symptoms currently.  She takes allopurinol  300 mg daily for gout prevention, with her last gout flare in the early 2000s. She has chronic kidney disease and sees a nephrologist regularly.  Her current medications include Tylenol  as needed for arthritis, an inhaler used during her recent bronchitis episode, allopurinol  300 mg daily, amlodipine  5 mg daily, atenolol  100 mg daily, Lasix once daily, levothyroxine  75 mcg daily, Jardiance  25 mg daily, and Ozempic  0.25 mg weekly. Her blood sugar is generally in the 130s unless she eats late at night. She has noticed weight gain despite being on Ozempic .  She has neuropathy in both feet, with no sensation, requiring cautious foot care. She uses a mirror to check her feet regularly. She also experiences venous insufficiency with  brownish discoloration and dry skin on her legs.  She is taking vitamin D3. She has not had a bone density test recently.         12/18/2023    8:45 AM 09/12/2023    9:14 AM 09/04/2023    9:28 AM 07/11/2023    2:02 PM 07/11/2023    1:58 PM  Depression screen PHQ 2/9  Decreased Interest 0 0 0 0 0  Down, Depressed, Hopeless 0 0 0 0 0  PHQ - 2 Score 0 0 0 0 0  Altered sleeping  0 0    Tired, decreased energy  0 0    Change in appetite  0 0    Feeling bad or failure about yourself   0 0    Trouble concentrating  0 0    Moving slowly or fidgety/restless  0 0    Suicidal thoughts  0 0    PHQ-9 Score  0 0    Difficult doing work/chores  Not difficult at all Not difficult at all          12/18/2023    8:46 AM  Fall Risk   Falls in the past year? 0  Number falls in past yr: 1  Injury with Fall? 0  Risk for fall due to : History of fall(s)    Patient Care Team: Mischa Pollard, MD as PCP - General (Family Medicine) Devon Fogo, MD (Inactive) as Consulting Physician (Dermatology) Devon Fogo, MD (Inactive) as Consulting Physician (Dermatology) Jhonny Moss, MD as Consulting  Physician (Neurology)   Review of Systems  Constitutional:  Negative for chills, fatigue and fever.  HENT:  Negative for congestion, ear pain and sore throat.   Respiratory:  Negative for cough and shortness of breath.   Cardiovascular:  Negative for chest pain and palpitations.  Gastrointestinal:  Negative for abdominal pain, constipation, diarrhea, nausea and vomiting.  Endocrine: Negative for polydipsia, polyphagia and polyuria.  Genitourinary:  Negative for difficulty urinating and dysuria.  Musculoskeletal:  Negative for arthralgias, back pain and myalgias.  Skin:  Negative for rash.  Neurological:  Positive for numbness. Negative for headaches.  Psychiatric/Behavioral:  Negative for dysphoric mood. The patient is not nervous/anxious.     Current Outpatient Medications on File Prior to Visit   Medication Sig Dispense Refill   acetaminophen  (TYLENOL ) 500 MG tablet Take 500-1,000 mg by mouth every 6 (six) hours as needed (pain.).     albuterol (VENTOLIN HFA) 108 (90 Base) MCG/ACT inhaler SMARTSIG:2 Puff(s) Via Inhaler 4 Times Daily PRN     allopurinol  (ZYLOPRIM ) 300 MG tablet Take 1 tablet (300 mg total) by mouth daily. 90 tablet 3   amLODipine  (NORVASC ) 5 MG tablet Take 1 tablet (5 mg total) by mouth at bedtime. 100 tablet 1   atenolol  (TENORMIN ) 50 MG tablet TAKE 2 TABLETS BY MOUTH AT BEDTIME. 180 tablet 1   atorvastatin  (LIPITOR) 80 MG tablet Take 1 tablet (80 mg total) by mouth every other day. At night 100 tablet 1   cholecalciferol (VITAMIN D3) 25 MCG (1000 UNIT) tablet Take 2 tablets (2,000 Units total) by mouth at bedtime. 60 tablet 1   citalopram  (CELEXA ) 10 MG tablet TAKE 1 TABLET BY MOUTH IN THE  MORNING 90 tablet 1   empagliflozin  (JARDIANCE ) 25 MG TABS tablet Take 1 tablet (25 mg total) by mouth daily before breakfast. 90 tablet 1   furosemide (LASIX) 20 MG tablet Take 40 mg by mouth in the morning.     Lancets (ONETOUCH DELICA PLUS LANCET33G) MISC APPLY TO AFFECTED AREA TOPICALLY EVERY DAY 100 each 2   levothyroxine  (SYNTHROID ) 75 MCG tablet Take 1 tablet (75 mcg total) by mouth daily before breakfast. 90 tablet 3   levothyroxine  (SYNTHROID ) 75 MCG tablet Take 1 tablet (75 mcg total) by mouth daily. 90 tablet 0   No current facility-administered medications on file prior to visit.   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[facility])   Past Surgical History:  Procedure Laterality Date   BREAST BIOPSY Left    BREAST BIOPSY Left    DILATION AND CURETTAGE OF UTERUS     DILITATION & CURRETTAGE/HYSTROSCOPY WITH VERSAPOINT RESECTION N/A 12/26/2012   Procedure: DILATATION & CURETTAGE/HYSTEROSCOPY WITH VERSAPOINT RESECTION;  Surgeon: Percy Bracken, MD;  Location: [facility];  Service: Gynecology;  Laterality: N/A;   IR IMAGING GUIDED PORT INSERTION  06/09/2022  IR REMOVAL TUN ACCESS W/ PORT W/O FL MOD SED  12/29/2022   JOINT REPLACEMENT     MANDIBLE FRACTURE SURGERY     ROBOTIC ASSISTED BILATERAL SALPINGO OOPHERECTOMY Bilateral 05/10/2022   Procedure: XI ROBOTIC ASSISTED BILATERAL SALPINGO OOPHORECTOMY, TOTAL HYSTERECTOMY, CYSTOSCOPY;  Surgeon: Suzi Essex, MD;  Location: [facility];  Service: Gynecology;  Laterality: Bilateral;   TOTAL KNEE ARTHROPLASTY Bilateral     Family History  Problem Relation Age of Onset   Heart disease Maternal Grandmother    Heart disease Maternal Grandfather    Stroke Maternal Grandfather    Colon cancer Maternal Grandfather        dx 69s   Breast cancer Other    Cancer Half-Brother        in spine   Ovarian cancer Neg Hx    Endometrial cancer Neg Hx    Pancreatic cancer Neg Hx    Prostate cancer Neg Hx    Social History   Socioeconomic History   Marital status: Divorced    Spouse name: Not on file   Number of children: Not on file   Years of education: Not on file   Highest education level: Not on file  Occupational History   Not on file  Tobacco Use   Smoking status: Former    Current packs/day: 0.00    Types: Cigarettes    Quit date: 1999    Years since quitting: 26.4    Passive exposure: Past   Smokeless tobacco: Never  Vaping Use   Vaping status: Never Used  Substance and Sexual Activity   Alcohol use: Yes    Comment: rare   Drug use: No    Sexual activity: Not Currently  Other Topics Concern   Not on file  Social History Narrative   ** Merged History Encounter **    Are you right handed or left handed? Right    Are you currently employed ? no   What is your current occupation?na   Do you live at home alone? no   Who lives with you? Lives daughter    What type of home do you live in: 1 story or 2 story?  3 story has a  lift. Lives on main level        Social Drivers of Health   Financial Resource Strain: Low Risk  (09/04/2023)   Overall Financial Resource Strain (CARDIA)    Difficulty of Paying Living Expenses: Not very hard  Food Insecurity: No Food Insecurity (09/04/2023)   Hunger Vital Sign    Worried About Bishop Out of Food in the Last Year: Never true    Ran Out of Food in the Last Year: Never true  Transportation Needs: No Transportation Needs (09/04/2023)   PRAPARE - Administrator, Civil Service (Medical): No    Lack of Transportation (Non-Medical): No  Physical Activity: Inactive (09/04/2023)   Exercise Vital Sign    Days of Exercise per Week: 0 days    Minutes of Exercise per Session: 0 min  Stress: No Stress Concern Present (09/04/2023)   [employer] of Occupational Health - Occupational Stress Questionnaire    Feeling of Stress : Not at all  Social Connections: Unknown (09/04/2023)   Social Connection and Isolation Panel    Frequency of Communication with Friends and Family: More than three times a week    Frequency of Social Gatherings with Friends and Family: More than three times a week    Attends Religious Services:  Not on file    Active Member of Clubs or Organizations: Yes    Attends Banker Meetings: Not on file    Marital Status: Divorced    Objective:  BP (!) 120/58   Pulse 66   Temp 97.6 F (36.4 C)   Resp 18   Ht 5' 3 (1.6 m)   Wt 226 lb (102.5 kg)   SpO2 97%   BMI 40.03 kg/m      03/21/2024    8:28 AM 01/19/2024    2:27 PM 12/18/2023    8:39 AM   BP/Weight  Systolic BP 120 129 110  Diastolic BP 58 56 60  Wt. (Lbs) 226 222 229  BMI 40.03 kg/m2 39.33 kg/m2 40.57 kg/m2    Physical Exam Vitals and nursing note reviewed.  Constitutional:      Appearance: She is obese.  HENT:     Head: Normocephalic and atraumatic.   Cardiovascular:     Rate and Rhythm: Normal rate and regular rhythm.  Pulmonary:     Effort: Pulmonary effort is normal.     Breath sounds: Normal breath sounds.   Musculoskeletal:     Comments: EXTREMITIES: Right leg with very dry skin, venous insufficiency, hair loss, and brownish discoloration. Scars from bilateral total knee replacements, non-pitting lymphedema, brownish discoloration. Left dorsalis pedis pulse slightly decreased, capillary refill slightly delayed. No cyanosis or edema. NEUROLOGICAL: Cranial nerves grossly intact, moves all extremities without gross motor deficit. 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   Skin:    General: Skin is dry.   Neurological:     Mental Status: She is alert and oriented to person, place, and time.   Psychiatric:        Mood and Affect: Mood normal.         Lab Results  Component Value Date   WBC 13.2 (H) 01/19/2024   HGB 13.3 01/19/2024   HCT 39.4 01/19/2024   PLT 234 01/19/2024   GLUCOSE 129 (H) 01/19/2024   CHOL 147 12/18/2023   TRIG 141 12/18/2023   HDL 43 12/18/2023   LDLCALC 79 12/18/2023   ALT 16 01/19/2024   AST 17 01/19/2024   NA 138 01/19/2024   K 3.9 01/19/2024   CL 101 01/19/2024   CREATININE 1.65 (H) 01/19/2024   BUN 58 (H) 01/19/2024   CO2 28 01/19/2024   TSH 4.770 (H) 12/18/2023   INR 1.0 05/13/2008   HGBA1C 7.9 (H) 12/18/2023      Assessment & Plan:  Primary hypertension Assessment & Plan: Managed with amlodipine , atenolol , and furosemide. Blood pressure at 120/58. Potential dose adjustment for amlodipine  due to kidney function. Advised to monitor sodium intake and increase physical activity. - Continue  current antihypertensive regimen. - Advise on lifestyle modifications for blood pressure and edema management.  Orders: -     CBC with Differential/Platelet -     Comprehensive metabolic panel with GFR  Acquired hypothyroidism -     TSH  Dyslipidemia due to type 2 diabetes mellitus (HCC) -     Hemoglobin A1c -     Lipid panel -     Microalbumin / creatinine urine ratio  Controlled type 2 diabetes mellitus with complication, without long-term current use of insulin (HCC) Assessment & Plan: COMPLICATIONS OF CHRONIC KIDNEY DISEASE STAGE 3 AND NEUROPATHY. Managed with Jardiance  and Ozempic . Blood sugar levels generally in the 130s, with occasional higher readings due to dietary choices. Weight gain noted despite Ozempic  use. - Increase Ozempic   dose to 0.5 mg. - Order blood work including A1c. - Advise on dietary modifications for blood sugar control and weight management.  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 Risk of injury due to lack of sensation. - Advise on foot care and safety measures. - Recommend avoiding pedicure centers to prevent infections.   Pancytopenia, acquired [facility]) Assessment & Plan: Resolved at this time. Will continue to monitor   History of epilepsy Assessment & Plan: Last seizure in 1994. No current seizures or medication. Neurologist follow-up scheduled in July. Diagnosis changed to history due to long-term seizure-free status. Seizures likely hormonally driven and ceased post-menopause.    General Health Maintenance Discussion of tetanus vaccination and bone density screening. Discussed potential outcomes of bone density test and treatment options if osteoporosis is diagnosed. - Check insurance coverage for tetanus shot and administer if covered. - Order bone density test. - Advise on calcium  and vitamin D  supplementation and strength training exercises.  Follow-up Plans discussed for various conditions and general health  maintenance. - Follow up in 3 months. - Schedule neurologist appointment in July. - Discuss allopurinol  dosage with nephrologist in upcoming appointment.       Other orders -     Ozempic  (0.25 or 0.5 MG/DOSE); Inject 0.5 mg into the skin once a week.  Dispense: 3 mL; Refill: 2   Assessment and Plan            Meds ordered this encounter  Medications   Semaglutide ,0.25 or 0.5MG /DOS, (OZEMPIC , 0.25 OR 0.5 MG/DOSE,) 2 MG/3ML SOPN    Sig: Inject 0.5 mg into the skin once a week.    Dispense:  3 mL    Refill:  2    Dose increased to 0.5 mg weekly    Orders Placed This Encounter  Procedures   CBC with Differential/Platelet   Comprehensive metabolic panel with GFR   Hemoglobin A1c   Lipid panel   TSH   Microalbumin / creatinine urine ratio     Follow-up: Return in about 3 months (around 06/21/2024) for chronic disease follow up.  An After Visit Summary was printed and given to the patient.  Mariene Dickerman, MD [facility] [phone]

## 2024-03-21 NOTE — Assessment & Plan Note (Addendum)
 COMPLICATIONS OF CHRONIC KIDNEY DISEASE STAGE 3 AND NEUROPATHY. Managed with Jardiance  and Ozempic . Blood sugar levels generally in the 130s, with occasional higher readings due to dietary choices. Weight gain noted despite Ozempic  use. - Increase Ozempic  dose to 0.5 mg. - Order blood work including A1c. - Advise on dietary modifications for blood sugar control and weight management.  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 Risk of injury due to lack of sensation. - Advise on foot care and safety measures. - Recommend avoiding pedicure centers to prevent infections.

## 2024-03-21 NOTE — Patient Instructions (Signed)
  VISIT SUMMARY: Today, we reviewed your overall health and addressed several ongoing conditions. We discussed your recent episode of bronchitis, diabetes management, kidney disease, gout, hypertension, neuropathy, venous insufficiency, hypothyroidism, and your history of epilepsy and rheumatoid arthritis. We also talked about general health maintenance, including vaccinations and bone density screening.  YOUR PLAN: ACUTE BRONCHITIS: You had an episode of acute bronchitis two months ago, which was treated at urgent care. You do not have any chronic symptoms now. -No further treatment needed at this time.  DIABETES MELLITUS TYPE 2: Your diabetes is managed with Jardiance  and Ozempic . Your blood sugar levels are generally in the 130s, but you have noticed some weight gain. -Increase Ozempic  dose to 0.5 mg. -Order blood work including A1c. -Advise on dietary modifications for blood sugar control and weight management.  CHRONIC KIDNEY DISEASE: You have chronic kidney disease related to your diabetes and are seeing a nephrologist regularly. -Discuss allopurinol  dosage with your nephrologist.  GOUT: You are taking allopurinol  to prevent gout flares, with your last flare occurring in the early 2000s. -Discuss allopurinol  dosage with your nephrologist.  HYPERTENSION: Your blood pressure is managed with amlodipine , atenolol , and furosemide. Your current blood pressure is 120/58. -Continue current antihypertensive regimen. -Advise on lifestyle modifications for blood pressure and edema management, including monitoring sodium intake and increasing physical activity.  PERIPHERAL NEUROPATHY: You have extensive neuropathy in both feet with loss of sensation, which increases your risk of injury. -Advise on foot care and safety measures. -Recommend avoiding pedicure centers to prevent infections.  VENOUS INSUFFICIENCY: You have brownish discoloration and non-pitting edema in your legs. -Advise on  lifestyle modifications to manage symptoms, including reducing sodium intake, elevating your legs, and increasing mobility.  HYPOTHYROIDISM: Your hypothyroidism is managed with levothyroxine  75 mcg daily. -Continue current levothyroxine  regimen.  HISTORY OF EPILEPSY: You have not had a seizure since 1994 and are not on any medication for epilepsy. You have a follow-up with your neurologist in July. -No current treatment needed. Continue follow-up with your neurologist.  RHEUMATOID ARTHRITIS: You have a history of rheumatoid arthritis but no current symptoms or medication. You have had knee replacements. -No current treatment needed.  GENERAL HEALTH MAINTENANCE: We discussed your tetanus vaccination and the need for a bone density test. -Check insurance coverage for tetanus shot and administer if covered. -Order bone density test. -Advise on calcium  and vitamin D  supplementation and strength training exercises.  FOLLOW-UP: Plans discussed for various conditions and general health maintenance. -Follow up in 3 months. -Schedule ne urologist appointment in July. -Discuss allopurinol  dosage with nephrologist in upcoming appointment.                      Contains text generated by [employer].                                 Contains text generated by [employer]bridge.

## 2024-03-22 LAB — COMPREHENSIVE METABOLIC PANEL WITH GFR
ALT: 11 IU/L (ref 0–32)
AST: 13 IU/L (ref 0–40)
Albumin: 4.2 g/dL (ref 3.7–4.7)
Alkaline Phosphatase: 111 IU/L (ref 44–121)
BUN/Creatinine Ratio: 27 (ref 12–28)
BUN: 40 mg/dL — ABNORMAL HIGH (ref 8–27)
Bilirubin Total: 0.3 mg/dL (ref 0.0–1.2)
CO2: 20 mmol/L (ref 20–29)
Calcium: 10.2 mg/dL (ref 8.7–10.3)
Chloride: 100 mmol/L (ref 96–106)
Creatinine, Ser: 1.48 mg/dL — ABNORMAL HIGH (ref 0.57–1.00)
Globulin, Total: 2.8 g/dL (ref 1.5–4.5)
Glucose: 131 mg/dL — ABNORMAL HIGH (ref 70–99)
Potassium: 4.8 mmol/L (ref 3.5–5.2)
Sodium: 137 mmol/L (ref 134–144)
Total Protein: 7 g/dL (ref 6.0–8.5)
eGFR: 35 mL/min/1.73 — ABNORMAL LOW (ref >59)

## 2024-03-22 LAB — MICROALBUMIN / CREATININE URINE RATIO
Creatinine, Urine: 24 mg/dL
Microalb/Creat Ratio: <13 mg/g{creat} (ref 0–29)
Microalbumin, Urine: <3 ug/mL

## 2024-03-22 LAB — TSH: TSH: 2.87 u[IU]/mL (ref 0.450–4.500)

## 2024-03-22 LAB — LIPID PANEL
Chol/HDL Ratio: 3.4 ratio (ref 0.0–4.4)
Cholesterol, Total: 151 mg/dL (ref 100–199)
HDL: 45 mg/dL (ref >39)
LDL Chol Calc (NIH): 81 mg/dL (ref 0–99)
Triglycerides: 145 mg/dL (ref 0–149)
VLDL Cholesterol Cal: 25 mg/dL (ref 5–40)

## 2024-03-22 LAB — CBC WITH DIFFERENTIAL/PLATELET
Basophils Absolute: 0.1 x10E3/uL (ref 0.0–0.2)
Basos: 1 %
EOS (ABSOLUTE): 0.3 x10E3/uL (ref 0.0–0.4)
Eos: 3 %
Hematocrit: 39.6 % (ref 34.0–46.6)
Hemoglobin: 13.3 g/dL (ref 11.1–15.9)
Immature Grans (Abs): 0.1 x10E3/uL (ref 0.0–0.1)
Immature Granulocytes: 1 %
Lymphocytes Absolute: 2 x10E3/uL (ref 0.7–3.1)
Lymphs: 21 %
MCH: 30.6 pg (ref 26.6–33.0)
MCHC: 33.6 g/dL (ref 31.5–35.7)
MCV: 91 fL (ref 79–97)
Monocytes Absolute: 0.7 x10E3/uL (ref 0.1–0.9)
Monocytes: 8 %
Neutrophils Absolute: 6.5 x10E3/uL (ref 1.4–7.0)
Neutrophils: 66 %
Platelets: 215 x10E3/uL (ref 150–450)
RBC: 4.34 x10E6/uL (ref 3.77–5.28)
RDW: 14.9 % (ref 11.7–15.4)
WBC: 9.6 x10E3/uL (ref 3.4–10.8)

## 2024-03-22 LAB — HEMOGLOBIN A1C
Est. average glucose Bld gHb Est-mCnc: 163 mg/dL
Hgb A1c MFr Bld: 7.3 % — ABNORMAL HIGH (ref 4.8–5.6)

## 2024-03-25 ENCOUNTER — Ambulatory Visit: Payer: Self-pay

## 2024-04-17 ENCOUNTER — Encounter: Payer: Self-pay | Admitting: Neurology

## 2024-04-17 ENCOUNTER — Ambulatory Visit: Payer: Medicare Other | Admitting: Neurology

## 2024-04-17 VITALS — BP 123/69 | HR 70 | Ht 63.5 in | Wt 222.0 lb

## 2024-04-17 DIAGNOSIS — Z8669 Personal history of other diseases of the nervous system and sense organs: Secondary | ICD-10-CM

## 2024-04-17 NOTE — Progress Notes (Signed)
 NEUROLOGY FOLLOW UP OFFICE NOTE  Sarah Bishop [id number] 01/15/1941  HISTORY OF PRESENT ILLNESS: I had the pleasure of seeing Sarah Bishop in follow-up in the neurology clinic on 04/17/2024.  The patient was last seen 7 months ago for well-controlled epilepsy. She is again accompanied by her daughter Stephane who helps supplement the history today.  Records and images were personally reviewed where available.  Her 1-hour EEG in 09/2023 was normal. On her initial visit, they had opted to do the EEG and continue on Depakote, however she reports that after she received EEG report, she stopped Depakote 6 months ago. She denies any seizure recurrence off medication. She denies any staring/unresponsive episodes, gaps in time, olfactory/gustatory hallucinations, focal numbness/tingling/weakness, myoclonic jerks. No headaches, dizziness, no falls. She recalls a history of migraines in childhood that then changed to epilepsy. Over the years, she has had episodes of a visual illusion seeing a jagged line that flickers for a couple of minutes. She had seen an eye doctor and was told to come when she had symptoms. No associated headache. They occur every once in a while, no increase with discontinuation of Depakote, last episode was a month ago. She manages her own medications. She has not noticed any change in symptoms off Depakote. She sleeps 6 hours at night and takes at least one nap in the daytime. She mostly watches TV.    History on Initial Assessment 09/15/2023: This is a very pleasant 83 year old right-handed woman with a history of hypertension, hyperlipidemia, DM2, hypothyroidism, epilepsy, presenting for evaluation of need for continued seizure medication. Seizures started at age 43, initially she had not warning however later on she would feel her eyes start blinking and have a certain feeling. She would have staring that may progressive to a convulsion. She denies any myoclonic jerks, olfactory/gustatory  hallucinations, focal numbness/tingling/weakness. One time she was in a car and the light through the trees made her feel like she would have a seizure. Stress was also a trigger. No nocturnal seizures. She cannot recall if there was a catamenial component. She has been on Depakote 750mg  every morning for at least 40 years, seizure-free since 1994. [name] denies any staring/unresponsive episodes. There was one instance when her dog passed away 2 years ago and she was acting weird, very emotional, unable to get her words out. No recurrence. She used to have migraines, no further headaches. No dizziness, diplopia, dysarthria/dysphagia. She has neck and back pain, urinary incontinence. She has neuropathy in both feet. No tremors. She usually gets 8-9 hours of sleep. Mood is very relaxes. She lives with [facility] and does not drive. She manages her own medications.  Epilepsy Risk Factors:  She had a normal birth and early development.  There is no history of febrile convulsions, CNS infections such as meningitis/encephalitis, significant traumatic brain injury, neurosurgical procedures, or family history of seizures.  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     albuterol (VENTOLIN HFA) 108 (90 Base) MCG/ACT inhaler SMARTSIG:2 Puff(s) Via Inhaler 4 Times Daily PRN     allopurinol  (ZYLOPRIM ) 300 MG tablet Take 1 tablet (300 mg total) by mouth daily. 90 tablet 3   amLODipine  (NORVASC ) 5 MG tablet Take 1 tablet (5 mg total) by mouth at bedtime. 100 tablet 1   atenolol  (TENORMIN ) 50 MG tablet TAKE 2 TABLETS BY MOUTH AT BEDTIME. 180 tablet 1   atorvastatin  (LIPITOR) 80 MG tablet Take 1 tablet (80 mg total) by mouth every other day. At night 100 tablet 1   cholecalciferol (VITAMIN D3) 25 MCG (1000 UNIT) tablet Take 2 tablets (2,000 Units total) by mouth at bedtime. 60 tablet 1   citalopram  (CELEXA ) 10 MG tablet TAKE 1 TABLET BY MOUTH IN THE  MORNING 90 tablet 1   empagliflozin  (JARDIANCE ) 25 MG TABS tablet Take 1 tablet (25 mg total) by mouth daily before breakfast. 90 tablet 1   furosemide (LASIX) 20 MG tablet Take 40 mg by mouth in the morning.     Lancets (ONETOUCH DELICA PLUS LANCET33G) MISC APPLY TO AFFECTED AREA TOPICALLY EVERY DAY 100 each 2   levothyroxine  (SYNTHROID ) 75 MCG tablet Take 1 tablet (75 mcg total) by mouth daily. 90 tablet 0   levothyroxine  (SYNTHROID ) 75 MCG tablet Take 1 tablet (75 mcg total) by mouth daily before breakfast. 90 tablet 3   Semaglutide ,0.25 or 0.5MG /DOS, (OZEMPIC , 0.25 OR 0.5 MG/DOSE,) 2 MG/3ML SOPN Inject 0.5 mg into the skin once a week. 3 mL 2   No current facility-administered medications on file prior to visit.    ALLERGIES: Allergies  Allergen Reactions   Augmentin [Amoxicillin-Pot Clavulanate] Other (See Comments)    Unsure of reaction.   Iodinated Contrast Media Nausea And Vomiting and Other (See Comments)    FAMILY HISTORY: Family History  Problem Relation Age of Onset   Heart disease Maternal Grandmother    Heart disease Maternal Grandfather    Stroke Maternal Grandfather    Colon cancer Maternal Grandfather        dx  60s   Breast cancer Other    Cancer Half-Brother        in spine   Ovarian cancer Neg Hx    Endometrial cancer Neg Hx    Pancreatic cancer Neg Hx    Prostate cancer Neg Hx     SOCIAL HISTORY: Social History   Socioeconomic History   Marital status: Divorced    Spouse name: Not on file   Number of children: Not on file   Years of education: Not on file   Highest education level: Not on file  Occupational History   Not on file  Tobacco Use   Smoking status: Former    Current packs/day: 0.00    Types: Cigarettes    Quit date: 1999    Years since quitting: 26.5    Passive exposure: Past   Smokeless tobacco: Never  Vaping Use   Vaping status: Never Used  Substance and Sexual Activity   Alcohol use: Yes    Comment: rare   Drug use: No   Sexual activity:  Not Currently  Other Topics Concern   Not on file  Social History Narrative   ** Merged History Encounter **    Are you right handed or left handed? Right    Are you currently employed ? no   What is your current occupation?na   Do you live at home alone? no   Who lives with you? Lives daughter    What type of home do you live in: 1 story or 2 story?  3 story has a  lift. Lives on main level        Social Drivers of Health   Financial Resource Strain: Low Risk  (09/04/2023)   Overall Financial Resource Strain (CARDIA)    Difficulty of Paying Living Expenses: Not very hard  Food Insecurity: No Food Insecurity (09/04/2023)   Hunger Vital Sign    Worried About Running Out of Food in the Last Year: Never true    Ran Out of Food in the Last Year: Never true  Transportation Needs: No Transportation Needs (09/04/2023)   PRAPARE - Administrator, Civil Service (Medical): No    Lack of Transportation (Non-Medical): No  Physical Activity: Inactive (09/04/2023)   Exercise Vital Sign    Days of Exercise per Week: 0 days    Minutes of Exercise per Session: 0 min  Stress: No Stress Concern Present (09/04/2023)    [employer] of Occupational Health - Occupational Stress Questionnaire    Feeling of Stress : Not at all  Social Connections: Unknown (09/04/2023)   Social Connection and Isolation Panel    Frequency of Communication with Friends and Family: More than three times a week    Frequency of Social Gatherings with Friends and Family: More than three times a week    Attends Religious Services: Not on file    Active Member of Clubs or Organizations: Yes    Attends Banker Meetings: Not on file    Marital Status: Divorced  Intimate Partner Violence: Not At Risk (09/04/2023)   Humiliation, Afraid, Rape, and Kick questionnaire    Fear of Current or Ex-Partner: No    Emotionally Abused: No    Physically Abused: No    Sexually Abused: No     PHYSICAL EXAM: Vitals:   04/17/24 0841  BP: 123/69  Pulse: 70  SpO2: 97%   General: No acute distress Head:  Normocephalic/atraumatic Skin/Extremities: No rash, no edema Neurological Exam: alert and awake. No aphasia or dysarthria. Fund of knowledge is appropriate. Attention and concentration are normal.   Cranial nerves: Pupils equal, round. Extraocular movements intact with no nystagmus. Visual fields full.  No facial asymmetry.  Motor: Bulk and tone normal, muscle strength 5/5 throughout with no pronator drift.   Finger to nose testing intact.  Gait slow and cautious with walker, no ataxia. No tremors.    IMPRESSION: This is a very pleasant 83 yo RH woman with a history of hypertension, hyperlipidemia, DM2, hypothyroidism, and well-controlled epilepsy. Seizures suggestive of Primary Generalized Epilepsy, she has been seizure-free since 1994 on low dose Depakote 750mg  daily up until she discontinued medication 6 months ago after normal EEG. She denies any seizures or seizure-like symptoms, continue to monitor symptoms off medication. She has had symptoms suggestive of ocular migraine, continue to monitor, call if any change. Proceed with  bone density scan as previously discussed. She does not drive. Follow-up as needed, call for any changes.   Thank you for allowing me to participate in her care.  Please do not hesitate to call for any questions or concerns.    Darice Shivers, M.D.   CC: Dr. [name]

## 2024-04-17 NOTE — Patient Instructions (Addendum)
 Good to see you doing well. Continue to monitor your symptoms.   Schedule bone density scan (please call [phone]  Follow-up as needed, call for any changes   Seizure Precautions: 1. If medication has been prescribed for you to prevent seizures, take it exactly as directed.  Do not stop taking the medicine without talking to your doctor first, even if you have not had a seizure in a long time.   2. Avoid activities in which a seizure would cause danger to yourself or to others.  Don't operate dangerous machinery, swim alone, or climb in high or dangerous places, such as on ladders, roofs, or girders.  Do not drive unless your doctor says you may.  3. If you have any warning that you may have a seizure, lay down in a safe place where you can't hurt yourself.    4.  No driving for 6 months from last seizure, as per [place]  state law.   Please refer to the following link on the [employer]'s website for more information: http://www.epilepsyfoundation.org/answerplace/Social/driving/drivingu.cfm   5.  Maintain good sleep hygiene.  6.  Contact your doctor if you have any problems that may be related to the medicine you are taking.  7.  Call 911 and bring the patient back to the ED if:        A.  The seizure lasts longer than 5 minutes.       B.  The patient doesn't awaken shortly after the seizure  C.  The patient has new problems such as difficulty seeing, speaking or moving  D.  The patient was injured during the seizure  E.  The patient has a temperature over 102 F (39C)  F.  The patient vomited and now is having trouble breathing

## 2024-04-25 ENCOUNTER — Other Ambulatory Visit: Payer: Self-pay | Admitting: Gynecologic Oncology

## 2024-04-25 DIAGNOSIS — C5702 Malignant neoplasm of left fallopian tube: Secondary | ICD-10-CM

## 2024-04-26 ENCOUNTER — Inpatient Hospital Stay: Attending: Gynecologic Oncology | Admitting: Gynecologic Oncology

## 2024-04-26 ENCOUNTER — Inpatient Hospital Stay: Attending: Gynecologic Oncology

## 2024-04-26 ENCOUNTER — Encounter: Payer: Self-pay | Admitting: Gynecologic Oncology

## 2024-04-26 VITALS — BP 122/60 | HR 66 | Temp 97.6°F | Resp 20 | Wt 223.0 lb

## 2024-04-26 DIAGNOSIS — Z8544 Personal history of malignant neoplasm of other female genital organs: Secondary | ICD-10-CM

## 2024-04-26 DIAGNOSIS — Z90710 Acquired absence of both cervix and uterus: Secondary | ICD-10-CM | POA: Diagnosis not present

## 2024-04-26 DIAGNOSIS — Z9079 Acquired absence of other genital organ(s): Secondary | ICD-10-CM | POA: Diagnosis not present

## 2024-04-26 DIAGNOSIS — Z9221 Personal history of antineoplastic chemotherapy: Secondary | ICD-10-CM | POA: Diagnosis not present

## 2024-04-26 DIAGNOSIS — Z90722 Acquired absence of ovaries, bilateral: Secondary | ICD-10-CM | POA: Insufficient documentation

## 2024-04-26 DIAGNOSIS — C5702 Malignant neoplasm of left fallopian tube: Secondary | ICD-10-CM

## 2024-04-26 DIAGNOSIS — Z08 Encounter for follow-up examination after completed treatment for malignant neoplasm: Secondary | ICD-10-CM | POA: Diagnosis present

## 2024-04-26 NOTE — Progress Notes (Signed)
 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  No significant cystic component or ascites.  Renal lesion of the right kidney lower pole is thought to represent a benign lesion.  1.3 cm cystic lesion of the head of the pancreas is recommended to be follow-up with pancreatic MRI in 2 years.   05/02/2022 Tumor Marker   Patient's tumor was tested for the following markers: CA-125. Results of the tumor marker test revealed 69.   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 Fullness appreciated within the left cul de sac. On intra-abdominal entry, normal upper abdominal survey including liver edge, diaphragm, and stomach. Normal appearing omentum. No ascites. Normal appearing small bowel. Significant inflammation within the pelvis concerning initially for recent diverticulitis versus endometriosis. Thickened and inflamed pelvic peritoneum. Filmy adhesions between the anterior cul de sac and uterus, between the right adnexa and and the sigmoid colon. Right adnexa inflammed but otherwise normal in appearing. Left fallopian tube dilated with evidence of hydrosalpinx. Friable adhesions of the fallopian tube to the sigmoid mesentery, left ovary and left broad ligament. No ascites. Uterus bulbous and 10cm with findings of endosalpingiosis versus  endometriosis on much of the uterine serosa. Dense adhesions between the bladder and cervix. Cul de sac without evidence of disease. No ascites.  Cystoscopy findings: bladder dome intact, normal efflux from bilateral ureteral orifices. On frozen section, no abnormality of the endometrium. Anterior cul de sac adhesion with findings of hemosiderin, no definitive malignancy. Fallopian tube with malignancy.  Given findings and pelvic peritoneal involvement given fallopian tube adherent to surrounding structures, no upper abdominal findings, normal omentum, and normal neuroassessment intraoperatively as well as on preoperative imaging, lymphadenectomy deferred.   05/10/2022 Pathology Results   Procedure: Hysterectomy with bilateral salpingo-oophorectomy  Specimen Integrity: Intact  Tumor Site: Left fallopian tube  Tumor Size: 6.3 x 3.8 x 2.8 cm  Histologic Type: Serous carcinoma  Histologic Grade: High-grade  Ovarian Surface Involvement: Not identified  Fallopian Tube Surface Involvement: Not identified  Implants (required for advanced stage serous/seromucinous borderline  tumors only): Not applicable  Other Tissue/ Organ Involvement: Not applicable  Largest Extrapelvic Peritoneal Focus: Not applicable  Peritoneal/Ascitic Fluid Involvement: Not identified [facility] [phone])  Chemotherapy Response Score (CRS): Not applicable, no known presurgical  therapy  Regional Lymph Nodes: Not applicable (no lymph nodes submitted or found)  Distant Metastasis: Not applicable  Pathologic Stage Classification (pTNM, AJCC 8th Edition): pT1a, pN n/a  Ancillary Studies: Available upon request  Representative Tumor Block: B15  Comment(s): An immunohistochemistry stain for p53 is performed with  adequate control and is diffusely positive within the tumor.  The same  stain shows patchy variable staining within the reactive mesothelium  lining the serosal adhesions within the anterior cul-de-sac    05/17/2022 Initial  Diagnosis   Fallopian tube cancer, carcinoma, left (HCC)   05/30/2022 Cancer Staging   Staging form: Ovary, Fallopian Tube, and Primary Peritoneal Carcinoma, AJCC  8th Edition - Pathologic stage from 05/30/2022: FIGO Stage IIB (pT2b, pN0, cM0) - Signed by Lonn Hicks, MD on 05/30/2022 Stage prefix: Initial diagnosis   06/10/2022 - 11/11/2022 Chemotherapy   Patient is on Treatment Plan : OVARIAN Carboplatin  (AUC 6) + Paclitaxel  (175) q21d X 6 Cycles     06/10/2022 Procedure   Placement of a subcutaneous power-injectable port device. Catheter tip at the superior cavoatrial junction.      Genetic Testing   Negative genetic testing. No pathogenic variants identified on the [facility] Multi-Cancer+RNA panel. The report date is 09/06/2022.   The Multi-Cancer + RNA Panel offered by [facility] includes sequencing and/or deletion/duplication analysis of the following 70 genes:  AIP*, ALK, APC*, ATM*, AXIN2*, BAP1*, BARD1*, BLM*, BMPR1A*, BRCA1*, BRCA2*, BRIP1*, CDC73*, CDH1*, CDK4, CDKN1B*, CDKN2A, CHEK2*, CTNNA1*, DICER1*, EPCAM, EGFR, FH*, FLCN*, GREM1, HOXB13, KIT, LZTR1, MAX*, MBD4, MEN1*, MET, MITF, MLH1*, MSH2*, MSH3*, MSH6*, MUTYH*, NF1*, NF2*, NTHL1*, PALB2*, PDGFRA, PMS2*, POLD1*, POLE*, POT1*, PRKAR1A*, PTCH1*, PTEN*, RAD51C*, RAD51D*, RB1*, RET, SDHA*, SDHAF2*, SDHB*, SDHC*, SDHD*, SMAD4*, SMARCA4*, SMARCB1*, SMARCE1*, STK11*, SUFU*, TMEM127*, TP53*, TSC1*, TSC2*, VHL*. RNA analysis is performed for * genes.   11/14/2022 Tumor Marker   Patient's tumor was tested for the following markers: CA-125. Results of the tumor marker test revealed 13.9.   12/12/2022 Imaging   1. No findings in the pelvis to suggest local recurrence. No evidence for metastatic disease in the abdomen or pelvis. 2. The cystic pancreatic head lesion seen on MRI is not well demonstrated by CT but measures approximately 10 mm. Continued attention on restaging scans recommended. Previous MRI recommended 2 year interval MRI follow-up. 3.  Cholelithiasis. 4.  Aortic Atherosclerosis (ICD10-I70.0).   01/02/2023 Procedure   Patient had successful removal of port    03/23/2023 Tumor Marker   Patient's tumor was tested for the following markers: CA-125. Results of the tumor marker test revealed 12.1.   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 Restaging assessment. * Tracking Code: BO * EXAM: CT ABDOMEN AND PELVIS WITHOUT CONTRAST TECHNIQUE: Multidetector CT imaging of the abdomen and pelvis was performed following the standard protocol without IV contrast. RADIATION DOSE REDUCTION: This exam was performed according to the departmental dose-optimization program which includes automated exposure control, adjustment of the mA and/or kV according to patient size and/or use of iterative reconstruction technique. COMPARISON:  12/09/2022 FINDINGS: Lower chest: Coronary and descending thoracic aortic atheromatous vascular disease. Mitral valve calcification. Hepatobiliary: Small depending gallstones in the gallbladder. No biliary dilatation. Otherwise unremarkable. Pancreas: The cystic lesion along the uncinate process previously seen on MRI is very poorly appreciable on CT today but appears roughly stable at about 0.9 cm on image 34 series 2. Spleen: Unremarkable Adrenals/Urinary Tract: Cysts of varying complexity as shown on MRI from 03/29/2022, these have been previously shown to be benign and require no further workup. Bilateral renal atrophy. Adrenal glands normal. Urinary bladder empty. Stomach/Bowel: Sigmoid colon diverticulosis. Chronic mild prominence of the appendix especially centrally or the appendiceal diameter is up to 0.9 cm on image 50 of series 2, this is been present at least since 07/13/2004 and is presumably incidental given the long-chronicity. Vascular/Lymphatic: Atherosclerosis is  present, including aortoiliac atherosclerotic disease. Atheromatous vascular calcification proximally in the superior mesenteric artery noted. No pathologic adenopathy. Reproductive: Uterus absent.  Adnexa unremarkable. Other: No supplemental non-categorized findings. Musculoskeletal: Lumbar spondylosis and degenerative disc  disease causing mild multilevel impingement. 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 IMPRESSION: 1. No findings of active malignancy. 2. Aortic atherosclerosis. 3. Cholelithiasis. 4. Sigmoid colon diverticulosis. 5. Chronic mild prominence of the appendix especially centrally, this has been present at least since 07/13/2004 and is presumably incidental given the long-chronicity. 6. 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 7. Lumbar spondylosis and degenerative disc disease causing mild multilevel impingement. 8. The cystic lesion along the uncinate process of the pancreas previously seen on MRI is very poorly appreciable on CT today but appears roughly stable at about 0.9 cm. Aortic Atherosclerosis (ICD10-I70.0). Electronically Signed   By: Ryan Salvage M.D.   On: 06/14/2023 16:12      09/28/2023 Tumor Marker   Patient's tumor was tested for the following markers: CA-125. Results of the tumor marker test revealed 14.3.     Interval History: Doing well.  Denies any abdominal or pelvic pain.  Reports baseline bowel and bladder function.  Has had a cough recently.  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[facility])    Past Surgical History:  Procedure Laterality Date   BREAST BIOPSY Left    BREAST BIOPSY Left    DILATION AND CURETTAGE OF UTERUS     DILITATION & CURRETTAGE/HYSTROSCOPY WITH VERSAPOINT RESECTION N/A 12/26/2012   Procedure: DILATATION & CURETTAGE/HYSTEROSCOPY WITH VERSAPOINT RESECTION;  Surgeon: Percilla Burly, MD;  Location: [facility];  Service: Gynecology;  Laterality: N/A;   IR IMAGING GUIDED PORT INSERTION  06/09/2022   IR REMOVAL TUN ACCESS W/ PORT W/O FL MOD SED  12/29/2022   JOINT REPLACEMENT     MANDIBLE FRACTURE SURGERY     ROBOTIC ASSISTED BILATERAL SALPINGO OOPHERECTOMY Bilateral 05/10/2022   Procedure: XI ROBOTIC ASSISTED BILATERAL SALPINGO OOPHORECTOMY, TOTAL HYSTERECTOMY, CYSTOSCOPY;  Surgeon: Viktoria Comer SAUNDERS, MD;  Location: [facility];  Service: Gynecology;  Laterality: Bilateral;   TOTAL KNEE ARTHROPLASTY Bilateral     Family History  Problem Relation Age of Onset   Heart disease Maternal Grandmother    Heart disease Maternal Grandfather    Stroke Maternal Grandfather    Colon cancer Maternal Grandfather        dx 69s   Breast cancer Other    Cancer Half-Brother        in spine   Ovarian cancer Neg Hx    Endometrial cancer Neg Hx    Pancreatic cancer Neg Hx    Prostate cancer Neg Hx     Social History   Socioeconomic History   Marital status: Divorced    Spouse name: Not on file   Number of children: Not on file   Years of education: Not on file   Highest education level: Not on file  Occupational History   Not on file  Tobacco Use  Smoking status: Former    Current packs/day: 0.00    Types: Cigarettes    Quit date: 1999    Years since quitting: 26.5    Passive exposure: Past   Smokeless tobacco: Never  Vaping Use   Vaping status: Never Used  Substance and  Sexual Activity   Alcohol use: Yes    Comment: rare   Drug use: No   Sexual activity: Not Currently  Other Topics Concern   Not on file  Social History Narrative   ** Merged History Encounter **    Are you right handed or left handed? Right    Are you currently employed ? no   What is your current occupation?na   Do you live at home alone? no   Who lives with you? Lives daughter    What type of home do you live in: 1 story or 2 story?  3 story has a  lift. Lives on main level        Social Drivers of Health   Financial Resource Strain: Low Risk  (09/04/2023)   Overall Financial Resource Strain (CARDIA)    Difficulty of Paying Living Expenses: Not very hard  Food Insecurity: No Food Insecurity (09/04/2023)   Hunger Vital Sign    Worried About Running Out of Food in the Last Year: Never true    Ran Out of Food in the Last Year: Never true  Transportation Needs: No Transportation Needs (09/04/2023)   PRAPARE - Administrator, Civil Service (Medical): No    Lack of Transportation (Non-Medical): No  Physical Activity: Inactive (09/04/2023)   Exercise Vital Sign    Days of Exercise per Week: 0 days    Minutes of Exercise per Session: 0 min  Stress: No Stress Concern Present (09/04/2023)   [employer] of Occupational Health - Occupational Stress Questionnaire    Feeling of Stress : Not at all  Social Connections: Unknown (09/04/2023)   Social Connection and Isolation Panel    Frequency of Communication with Friends and Family: More than three times a week    Frequency of Social Gatherings with Friends and Family: More than three times a week    Attends Religious Services: Not on Marketing executive or Organizations: Yes    Attends Banker Meetings: Not on file    Marital Status: Divorced    Current Medications:  Current Outpatient Medications:    acetaminophen  (TYLENOL ) 500 MG tablet, Take 500-1,000 mg by mouth every 6 (six) hours as  needed (pain.)., Disp: , Rfl:    albuterol (VENTOLIN HFA) 108 (90 Base) MCG/ACT inhaler, SMARTSIG:2 Puff(s) Via Inhaler 4 Times Daily PRN, Disp: , Rfl:    allopurinol  (ZYLOPRIM ) 300 MG tablet, Take 1 tablet (300 mg total) by mouth daily., Disp: 90 tablet, Rfl: 3   amLODipine  (NORVASC ) 5 MG tablet, Take 1 tablet (5 mg total) by mouth at bedtime., Disp: 100 tablet, Rfl: 1   atenolol  (TENORMIN ) 50 MG tablet, TAKE 2 TABLETS BY MOUTH AT BEDTIME., Disp: 180 tablet, Rfl: 1   atorvastatin  (LIPITOR) 80 MG tablet, Take 1 tablet (80 mg total) by mouth every other day. At night, Disp: 100 tablet, Rfl: 1   cholecalciferol (VITAMIN D3) 25 MCG (1000 UNIT) tablet, Take 2 tablets (2,000 Units total) by mouth at bedtime., Disp: 60 tablet, Rfl: 1   citalopram  (CELEXA ) 10 MG tablet, TAKE 1 TABLET BY MOUTH IN THE  MORNING, Disp: 90 tablet, Rfl: 1  empagliflozin  (JARDIANCE ) 25 MG TABS tablet, Take 1 tablet (25 mg total) by mouth daily before breakfast., Disp: 90 tablet, Rfl: 1   furosemide (LASIX) 20 MG tablet, Take 40 mg by mouth in the morning., Disp: , Rfl:    Lancets (ONETOUCH DELICA PLUS LANCET33G) MISC, APPLY TO AFFECTED AREA TOPICALLY EVERY DAY, Disp: 100 each, Rfl: 2   levothyroxine  (SYNTHROID ) 75 MCG tablet, Take 1 tablet (75 mcg total) by mouth daily., Disp: 90 tablet, Rfl: 0   levothyroxine  (SYNTHROID ) 75 MCG tablet, Take 1 tablet (75 mcg total) by mouth daily before breakfast., Disp: 90 tablet, Rfl: 3   Semaglutide ,0.25 or 0.5MG /DOS, (OZEMPIC , 0.25 OR 0.5 MG/DOSE,) 2 MG/3ML SOPN, Inject 0.5 mg into the skin once a week., Disp: 3 mL, Rfl: 2  Review of Systems: + mouth sores, cough Denies appetite changes, fevers, chills, fatigue, unexplained weight changes. Denies hearing loss, neck lumps or masses, ringing in ears or voice changes. Denies wheezing.  Denies shortness of breath. Denies chest pain or palpitations. Denies leg swelling. Denies abdominal distention, pain, blood in stools, constipation,  diarrhea, nausea, vomiting, or early satiety. Denies pain with intercourse, dysuria, frequency, hematuria or incontinence. Denies hot flashes, pelvic pain, vaginal bleeding or vaginal discharge.   Denies joint pain, back pain or muscle pain/cramps. Denies itching, rash, or wounds. Denies dizziness, headaches, numbness or seizures. Denies swollen lymph nodes or glands, denies easy bruising or bleeding. Denies anxiety, depression, confusion, or decreased concentration.  Physical Exam: BP (!) 149/48 (BP Location: Left Arm, Patient Position: Sitting)   Pulse 66   Temp 97.6 F (36.4 C) (Oral)   Resp 20   Wt 223 lb (101.2 kg)   SpO2 96%   BMI 38.88 kg/m  General: Alert, oriented, no acute distress. HEENT: Normocephalic, atraumatic, sclera anicteric. Chest: Clear to auscultation bilaterally.  No wheezes or rhonchi. Cardiovascular: regular rate and rhythm, no murmurs. Abdomen: Obese, soft, nontender.  Normoactive bowel sounds.  No masses or hepatosplenomegaly appreciated.  Well-healed incisions. Extremities: Grossly normal range of motion.  Warm, well perfused.  Trace edema bilaterally. Skin: No rashes or lesions noted. Lymphatics: No cervical, supraclavicular, or inguinal adenopathy. Shotty submandibular lymph nodes. GU: Normal appearing external genitalia without erythema, excoriation, or lesions.  Speculum exam reveals mildly atrophic vaginal mucosa, no lesions, cuff intact.  Bimanual exam reveals no masses or nodularity.  Rectovaginal exam confirms these findings. Some hard stool palpated in the rectum.  Laboratory & Radiologic Studies:         Component Ref Range & Units (hover) 3 mo ago (01/19/24) 7 mo ago (09/25/23) 10 mo ago (06/13/23) 1 yr ago (03/21/23) 1 yr ago (11/11/22) 1 yr ago (05/02/22)  Cancer Antigen (CA) 125 11.3 14.3 CM 14.1 CM 12.1 CM 13.9 CM 69.0 High  CM     Assessment & Plan: Sarah Bishop is a 83 y.o. 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 PDS 05/2022. Completed adj chemotherapy 11/2022. Negative germline. HRD negative. HER2 1+. Last imaging 06/2023, no findings of recurrent disease.   Doing well, NED on exam today. CA125 drawn today.  Scheduled for imaging in 06/2024.  Some firm stool in the rectum.  Recommended starting bowel regimen.   Per NCCN surveillance recommendations, we will continue with visits every 2-4 months.  The patient is scheduled to see Dr. Lonn in September 2025.  I will see her for follow-up in December.  We reviewed signs and symptoms that should prompt a  phone call before her next visit.  20 minutes of total time was spent for this patient encounter, including preparation, face-to-face counseling with the patient and coordination of care, and documentation of the encounter.  Comer Dollar, MD  Division of Gynecologic Oncology  Department of Obstetrics and Gynecology  [facility]

## 2024-04-26 NOTE — Patient Instructions (Addendum)
 It was good to see you today.  I do not see or feel any evidence of cancer recurrence on your exam.  I will see you for follow-up in 5 months.  I would recommend starting something to help keep your bowels soft, such as MiraLAX, fiber, or a stool softener.  As always, if you develop any new and concerning symptoms before your next visit, please call to see me sooner.

## 2024-04-27 LAB — CA 125: Cancer Antigen (CA) 125: 11.6 U/mL (ref 0.0–38.1)

## 2024-04-29 ENCOUNTER — Ambulatory Visit: Payer: Self-pay | Admitting: Gynecologic Oncology

## 2024-05-01 ENCOUNTER — Other Ambulatory Visit: Payer: Self-pay

## 2024-05-01 MED ORDER — ONETOUCH DELICA PLUS LANCET33G MISC: 2 refills | Status: DC

## 2024-05-01 MED ORDER — GLUCOSE BLOOD VI STRP: ORAL_STRIP | 12 refills | Status: AC

## 2024-05-13 ENCOUNTER — Other Ambulatory Visit: Payer: Self-pay

## 2024-05-23 ENCOUNTER — Telehealth: Payer: Self-pay

## 2024-05-23 ENCOUNTER — Other Ambulatory Visit: Payer: Self-pay

## 2024-05-23 MED ORDER — ACCU-CHEK GUIDE W/DEVICE KIT: 1.0000 | PACK | 0 refills | Status: DC

## 2024-05-23 NOTE — Telephone Encounter (Signed)
 Reached out to [facility] about the message below. They could not find any note as to why they were reaching out to us  , therefore could not help.   Copied from CRM [id number]. Topic: Clinical - Prescription Issue >> May 23, 2024 12:12 PM Sarah Bishop wrote: Reason for CRM: [facility] tried to reach Dr. [name] a few times about prescription for the pt. Please follow up with them

## 2024-06-05 ENCOUNTER — Inpatient Hospital Stay: Payer: Medicare Other

## 2024-06-05 ENCOUNTER — Ambulatory Visit (HOSPITAL_COMMUNITY)

## 2024-06-05 ENCOUNTER — Other Ambulatory Visit

## 2024-06-11 ENCOUNTER — Inpatient Hospital Stay: Attending: Gynecologic Oncology

## 2024-06-11 ENCOUNTER — Encounter (HOSPITAL_COMMUNITY): Payer: Self-pay

## 2024-06-11 ENCOUNTER — Ambulatory Visit (HOSPITAL_COMMUNITY)
Admission: RE | Admit: 2024-06-11 | Discharge: 2024-06-11 | Disposition: A | Discharge status: Home / Self Care | Source: Ambulatory Visit | Attending: Hematology and Oncology | Admitting: Hematology and Oncology

## 2024-06-11 DIAGNOSIS — C5702 Malignant neoplasm of left fallopian tube: Secondary | ICD-10-CM | POA: Insufficient documentation

## 2024-06-11 DIAGNOSIS — N1832 Chronic kidney disease, stage 3b: Secondary | ICD-10-CM | POA: Insufficient documentation

## 2024-06-11 DIAGNOSIS — K862 Cyst of pancreas: Secondary | ICD-10-CM | POA: Insufficient documentation

## 2024-06-11 LAB — COMPREHENSIVE METABOLIC PANEL WITH GFR
ALT: 12 U/L (ref 0–44)
AST: 14 U/L — ABNORMAL LOW (ref 15–41)
Albumin: 4.2 g/dL (ref 3.5–5.0)
Alkaline Phosphatase: 90 U/L (ref 38–126)
Anion gap: 6 (ref 5–15)
BUN: 47 mg/dL — ABNORMAL HIGH (ref 8–23)
CO2: 29 mmol/L (ref 22–32)
Calcium: 9.9 mg/dL (ref 8.9–10.3)
Chloride: 105 mmol/L (ref 98–111)
Creatinine, Ser: 1.33 mg/dL — ABNORMAL HIGH (ref 0.44–1.00)
GFR, Estimated: 39 mL/min — ABNORMAL LOW (ref >60)
Glucose, Bld: 93 mg/dL (ref 70–99)
Potassium: 4.2 mmol/L (ref 3.5–5.1)
Sodium: 140 mmol/L (ref 135–145)
Total Bilirubin: 0.4 mg/dL (ref 0.0–1.2)
Total Protein: 7.2 g/dL (ref 6.5–8.1)

## 2024-06-11 LAB — CBC WITH DIFFERENTIAL/PLATELET
Abs Immature Granulocytes: 0.05 K/uL (ref 0.00–0.07)
Basophils Absolute: 0.1 K/uL (ref 0.0–0.1)
Basophils Relative: 1 %
Eosinophils Absolute: 0.3 K/uL (ref 0.0–0.5)
Eosinophils Relative: 4 %
HCT: 40.6 % (ref 36.0–46.0)
Hemoglobin: 13.3 g/dL (ref 12.0–15.0)
Immature Granulocytes: 1 %
Lymphocytes Relative: 24 %
Lymphs Abs: 2 K/uL (ref 0.7–4.0)
MCH: 30.5 pg (ref 26.0–34.0)
MCHC: 32.8 g/dL (ref 30.0–36.0)
MCV: 93.1 fL (ref 80.0–100.0)
Monocytes Absolute: 0.7 K/uL (ref 0.1–1.0)
Monocytes Relative: 8 %
Neutro Abs: 5.2 K/uL (ref 1.7–7.7)
Neutrophils Relative %: 62 %
Platelets: 204 K/uL (ref 150–400)
RBC: 4.36 MIL/uL (ref 3.87–5.11)
RDW: 15.7 % — ABNORMAL HIGH (ref 11.5–15.5)
WBC: 8.3 K/uL (ref 4.0–10.5)
nRBC: 0 % (ref 0.0–0.2)

## 2024-06-12 LAB — CA 125: Cancer Antigen (CA) 125: 11.8 U/mL (ref 0.0–38.1)

## 2024-06-13 ENCOUNTER — Inpatient Hospital Stay: Payer: Medicare Other | Admitting: Hematology and Oncology

## 2024-06-13 ENCOUNTER — Encounter: Payer: Self-pay | Admitting: Hematology and Oncology

## 2024-06-13 ENCOUNTER — Telehealth: Payer: Self-pay

## 2024-06-13 VITALS — BP 135/62 | HR 58 | Temp 98.0°F | Resp 18 | Ht 63.5 in | Wt 222.2 lb

## 2024-06-13 DIAGNOSIS — N1832 Chronic kidney disease, stage 3b: Secondary | ICD-10-CM | POA: Diagnosis not present

## 2024-06-13 DIAGNOSIS — C5702 Malignant neoplasm of left fallopian tube: Secondary | ICD-10-CM

## 2024-06-13 DIAGNOSIS — K862 Cyst of pancreas: Secondary | ICD-10-CM | POA: Diagnosis not present

## 2024-06-13 NOTE — Assessment & Plan Note (Addendum)
 The patient was diagnosed with fallopian tube cancer in June 2023 after presentation with abdominal mass She underwent robotic assisted laparoscopic total hysterectomy with bilateral salpingo-oophorectomy, final stage IIb Final pathology: High-grade serous carcinoma, HER2/neu negative, negative for germline genetic testing and HRD She completed 6 cycles of chemotherapy by February 2024  I have reviewed her CT imaging which show no evidence of recurrent disease I plan to see her again in 6 months for further follow-up with repeat blood work and imaging study

## 2024-06-13 NOTE — Assessment & Plan Note (Addendum)
 This 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 study

## 2024-06-13 NOTE — Progress Notes (Signed)
 [facility] OFFICE PROGRESS NOTE  Patient Care Team: [name], [name], MD as PCP - General (Family Medicine) Livingston Rigg, MD as Consulting Physician (Dermatology) Livingston Rigg, MD as Consulting Physician (Dermatology) Georjean Darice HERO, MD as Consulting Physician (Neurology)  Assessment & Plan Fallopian tube cancer, carcinoma, left [facility]) The patient was diagnosed with fallopian tube cancer in June 2023 after presentation with abdominal mass She underwent robotic assisted laparoscopic total hysterectomy with bilateral salpingo-oophorectomy, final stage IIb Final pathology: High-grade serous carcinoma, HER2/neu negative, negative for germline genetic testing and HRD She completed 6 cycles of chemotherapy by February 2024  I have reviewed her CT imaging which show no evidence of recurrent disease I plan to see her again in 6 months for further follow-up with repeat blood work and imaging study Stage 3b chronic kidney disease (HCC) This has marginally improved We discussed importance of risk factor modification and adequate hydration Pancreatic cyst This 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 study  Orders Placed This Encounter  Procedures   CT ABDOMEN PELVIS WO CONTRAST    Standing Status:   Future    Expected Date:   12/11/2024    Expiration Date:   06/13/2025    Preferred imaging location?:   [facility]    If indicated for the ordered procedure, I authorize the administration of oral contrast media per Radiology protocol:   No    Reason for no oral contrast::   no need oral contrast     Sarah Bedford, MD  INTERVAL HISTORY: she returns for surveillance follow-up for history of fallopian tube cancer She is here accompanied by her daughter She denies abdominal pain or changes in bowel habits I reviewed labs and CT imaging We discussed future follow-up  PHYSICAL EXAMINATION: ECOG PERFORMANCE STATUS: 0 - Asymptomatic  Vitals:   06/13/24 0920  BP: 135/62  Pulse: (!) 58   Resp: 18  Temp: 98 F (36.7 C)  SpO2: 97%   Filed Weights   06/13/24 0920  Weight: 222 lb 3.2 oz (100.8 kg)    Relevant data reviewed during this visit included CBC, CMP, CA125, CT imaging from September 2025

## 2024-06-13 NOTE — Telephone Encounter (Signed)
 Attempted to call patient on primary number regarding today's appointment. No answer. Left voicemail providing patient with clinic number to confirm appointment or to reschedule.

## 2024-06-13 NOTE — Assessment & Plan Note (Addendum)
 This has marginally improved We discussed importance of risk factor modification and adequate hydration

## 2024-06-14 ENCOUNTER — Other Ambulatory Visit: Payer: Self-pay

## 2024-06-14 MED ORDER — OZEMPIC (0.25 OR 0.5 MG/DOSE) 2 MG/3ML ~~LOC~~ SOPN: 0.5000 mg | PEN_INJECTOR | SUBCUTANEOUS | 2 refills | Status: DC

## 2024-06-24 ENCOUNTER — Ambulatory Visit (INDEPENDENT_AMBULATORY_CARE_PROVIDER_SITE_OTHER)

## 2024-06-24 VITALS — BP 122/82 | HR 70 | Temp 98.3°F | Ht 63.5 in | Wt 220.6 lb

## 2024-06-24 DIAGNOSIS — Z23 Encounter for immunization: Secondary | ICD-10-CM | POA: Diagnosis not present

## 2024-06-24 DIAGNOSIS — E2839 Other primary ovarian failure: Secondary | ICD-10-CM | POA: Insufficient documentation

## 2024-06-24 DIAGNOSIS — E785 Hyperlipidemia, unspecified: Secondary | ICD-10-CM

## 2024-06-24 DIAGNOSIS — E039 Hypothyroidism, unspecified: Secondary | ICD-10-CM

## 2024-06-24 DIAGNOSIS — I10 Essential (primary) hypertension: Secondary | ICD-10-CM

## 2024-06-24 DIAGNOSIS — Z6838 Body mass index (BMI) 38.0-38.9, adult: Secondary | ICD-10-CM

## 2024-06-24 DIAGNOSIS — N1832 Chronic kidney disease, stage 3b: Secondary | ICD-10-CM

## 2024-06-24 DIAGNOSIS — M150 Primary generalized (osteo)arthritis: Secondary | ICD-10-CM | POA: Insufficient documentation

## 2024-06-24 DIAGNOSIS — E118 Type 2 diabetes mellitus with unspecified complications: Secondary | ICD-10-CM | POA: Diagnosis not present

## 2024-06-24 DIAGNOSIS — E66812 Obesity, class 2: Secondary | ICD-10-CM | POA: Insufficient documentation

## 2024-06-24 LAB — POCT GLYCOSYLATED HEMOGLOBIN (HGB A1C): HbA1c POC (<> result, manual entry): 6.4 % (ref 4.0–5.6)

## 2024-06-24 MED ORDER — SEMAGLUTIDE (1 MG/DOSE) 4 MG/3ML ~~LOC~~ SOPN: 1.0000 mg | PEN_INJECTOR | SUBCUTANEOUS | 1 refills | Status: AC

## 2024-06-24 NOTE — Assessment & Plan Note (Addendum)
 Blood pressure is well-controlled with current medication regimen of AMLODIPINE  5 mg daily and Atenolol  100 mg daily. Takes furosemide 20 mg ONE TABLET DAILY  STABLE KIDNEY FUNCTION CONSISTENT WITH CKD 3.

## 2024-06-24 NOTE — Assessment & Plan Note (Signed)
 Hypothyroidism is well-managed with current medication regimen of levothyroxine  75 mcg daily. Last thyroid function test was in June, and no repeat test is needed at this time.

## 2024-06-24 NOTE — Assessment & Plan Note (Signed)
 Obesity with recent weight loss. Weight decreased from 236 pounds in September last year to 220 pounds currently. - Encourage continued weight loss through diet and exercise. - Discuss the role of increased Ozempic  dose in aiding weight loss. She is on ozempic  due to her diabetes. -

## 2024-06-24 NOTE — Progress Notes (Signed)
 Subjective:  Patient ID: Sarah Bishop, female    DOB: 1941-04-18  Age: 83 y.o. MRN: [id number]  Chief Complaint  Patient presents with   Medical Management of Chronic Issues    HPI: Discussed the use of AI scribe software for clinical note transcription with the patient, who gave verbal consent to proceed.  Discussed the use of AI scribe software for clinical note transcription with the patient, who gave verbal consent to proceed.  History of Present Illness   Sarah Bishop is an 83 year old female with type 2 diabetes who presents for medication adjustment and follow-up. She is accompanied by her daughter, who is her primary caregiver.  Glycemic control and weight management - Type 2 diabetes managed with Ozempic , initially at 0.5 mg, recently increased to 1 mg - Tolerates Ozempic  well without adverse effects - Weight decreased from 236 pounds in September last year to 220 pounds currently - Hemoglobin A1c was 7.3% in June - No pain or discomfort - No tingling or numbness in feet  Cardiovascular and renal status - Current medications include amlodipine  5 mg daily, atenolol  100 mg at bedtime, and furosemide 20 mg daily - Recent blood work shows stable kidney function, normal liver enzymes, and normal blood counts - No history of falls  Thyroid function - Takes thyroid medication daily on an empty stomach  Dietary habits and appetite control - Breakfast typically consists of 'cracking egg' with vegetables, cheese, and meat, occasionally half a bagel - Snacks on grape tomatoes - Mindful of portion sizes, especially with high-sugar fruits such as grapes and bananas - Experiences challenges with evening cravings, particularly when daughters are home and discuss ice cream  Mood and functional status - Scored zero on depression screening  Gastrointestinal history - History of gallstones and diverticulosis           06/24/2024    9:00 AM 12/18/2023    8:45 AM 09/12/2023     9:14 AM 09/04/2023    9:28 AM 07/11/2023    2:02 PM  Depression screen PHQ 2/9  Decreased Interest 0 0 0 0 0  Down, Depressed, Hopeless 0 0 0 0 0  PHQ - 2 Score 0 0 0 0 0  Altered sleeping   0 0   Tired, decreased energy   0 0   Change in appetite   0 0   Feeling bad or failure about yourself    0 0   Trouble concentrating   0 0   Moving slowly or fidgety/restless   0 0   Suicidal thoughts   0 0   PHQ-9 Score   0 0   Difficult doing work/chores   Not difficult at all Not difficult at all         06/24/2024    8:59 AM  Fall Risk   Falls in the past year? 0  Number falls in past yr: 0  Injury with Fall? 0  Risk for fall due to : No Fall Risks  Follow up Falls evaluation completed    Patient Care Team: Sharrod Achille, MD as PCP - General (Family Medicine) Livingston Rigg, MD as Consulting Physician (Dermatology) Livingston Rigg, MD as Consulting Physician (Dermatology) Georjean Darice HERO, MD as Consulting Physician (Neurology)   Review of Systems  Constitutional:  Negative for chills, fatigue and fever.  HENT:  Negative for congestion, ear pain, sinus pressure and sore throat.   Respiratory:  Negative for cough and shortness of breath.  Cardiovascular:  Negative for chest pain.  Gastrointestinal:  Negative for abdominal pain, constipation, diarrhea, nausea and vomiting.  Genitourinary:  Negative for dysuria and frequency.  Musculoskeletal:  Negative for arthralgias, back pain and myalgias.  Neurological:  Negative for dizziness and headaches.  Psychiatric/Behavioral:  Negative for dysphoric mood. The patient is not nervous/anxious.     Current Outpatient Medications on File Prior to Visit  Medication Sig Dispense Refill   acetaminophen  (TYLENOL ) 500 MG tablet Take 500-1,000 mg by mouth every 6 (six) hours as needed (pain.).     allopurinol  (ZYLOPRIM ) 300 MG tablet TAKE 1 TABLET BY MOUTH DAILY 100 tablet 2   amLODipine  (NORVASC ) 5 MG tablet TAKE 1 TABLET BY MOUTH AT  BEDTIME  100 tablet 2   atenolol  (TENORMIN ) 50 MG tablet TAKE 2 TABLETS BY MOUTH AT BEDTIME. 180 tablet 1   atorvastatin  (LIPITOR) 80 MG tablet Take 1 tablet (80 mg total) by mouth every other day. At night 100 tablet 1   Blood Glucose Monitoring Suppl (ACCU-CHEK GUIDE) w/Device KIT 1 each by Does not apply route as directed. 1 kit 0   cholecalciferol (VITAMIN D3) 25 MCG (1000 UNIT) tablet Take 2 tablets (2,000 Units total) by mouth at bedtime. 60 tablet 1   citalopram  (CELEXA ) 10 MG tablet TAKE 1 TABLET BY MOUTH IN THE  MORNING 90 tablet 1   empagliflozin  (JARDIANCE ) 25 MG TABS tablet Take 1 tablet (25 mg total) by mouth daily before breakfast. 90 tablet 1   furosemide (LASIX) 20 MG tablet Take 40 mg by mouth in the morning.     glucose blood test strip Use as instructed 100 each 12   Lancets (ONETOUCH DELICA PLUS LANCET33G) MISC APPLY TO AFFECTED AREA TOPICALLY EVERY DAY 100 each 2   levothyroxine  (SYNTHROID ) 75 MCG tablet Take 1 tablet (75 mcg total) by mouth daily before breakfast. 90 tablet 3   No current facility-administered medications on file prior to visit.   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[facility])   Past Surgical History:  Procedure Laterality Date   BREAST BIOPSY Left    BREAST BIOPSY Left    DILATION AND CURETTAGE OF UTERUS     DILITATION & CURRETTAGE/HYSTROSCOPY WITH VERSAPOINT RESECTION N/A 12/26/2012   Procedure:  DILATATION & CURETTAGE/HYSTEROSCOPY WITH VERSAPOINT RESECTION;  Surgeon: Percilla Burly, MD;  Location: [facility];  Service: Gynecology;  Laterality: N/A;   IR IMAGING GUIDED PORT INSERTION  06/09/2022   IR REMOVAL TUN ACCESS W/ PORT W/O FL MOD SED  12/29/2022   JOINT REPLACEMENT     MANDIBLE FRACTURE SURGERY     ROBOTIC ASSISTED BILATERAL SALPINGO OOPHERECTOMY Bilateral 05/10/2022   Procedure: XI ROBOTIC ASSISTED BILATERAL SALPINGO OOPHORECTOMY, TOTAL HYSTERECTOMY, CYSTOSCOPY;  Surgeon: Viktoria Comer SAUNDERS, MD;  Location: [facility];  Service: Gynecology;  Laterality: Bilateral;   TOTAL KNEE ARTHROPLASTY  Bilateral     Family History  Problem Relation Age of Onset   Heart disease Maternal Grandmother    Heart disease Maternal Grandfather    Stroke Maternal Grandfather    Colon cancer Maternal Grandfather        dx 53s   Breast cancer Other    Cancer Half-Brother        in spine   Ovarian cancer Neg Hx    Endometrial cancer Neg Hx    Pancreatic cancer Neg Hx    Prostate cancer Neg Hx    Social History   Socioeconomic History   Marital status: Divorced    Spouse name: Not on file   Number of children: Not on file   Years of education: Not on file   Highest education level: Not on file  Occupational History   Not on file  Tobacco Use   Smoking status: Former    Current packs/day: 0.00    Types: Cigarettes    Quit date: 1999    Years since quitting: 26.7    Passive exposure: Past   Smokeless tobacco: Never  Vaping Use   Vaping status: Never Used  Substance and Sexual Activity   Alcohol use: Yes    Comment: rare   Drug use: No   Sexual activity: Not Currently  Other Topics Concern   Not on file  Social History Narrative   ** Merged History Encounter **    Are you right handed or left handed? Right    Are you currently employed ? no   What is your current occupation?na   Do you live at home alone? no   Who lives with you? Lives daughter    What type of home do you live in:  1 story or 2 story?  3 story has a  lift. Lives on main level        Social Drivers of Health   Financial Resource Strain: Low Risk  (09/04/2023)   Overall Financial Resource Strain (CARDIA)    Difficulty of Paying Living Expenses: Not very hard  Food Insecurity: No Food Insecurity (09/04/2023)   Hunger Vital Sign    Worried About Running Out of Food in the Last Year: Never true    Ran Out of Food in the Last Year: Never true  Transportation Needs: No Transportation Needs (09/04/2023)   PRAPARE - Administrator, Civil Service (Medical): No    Lack of Transportation (Non-Medical): No  Physical Activity: Inactive (09/04/2023)   Exercise Vital Sign    Days of Exercise per Week: 0 days    Minutes of Exercise per Session: 0 min  Stress: No Stress Concern Present (09/04/2023)   [employer] of Occupational Health - Occupational Stress Questionnaire    Feeling of Stress : Not at all  Social Connections: Unknown (09/04/2023)   Social Connection and Isolation Panel    Frequency of Communication with Friends and Family: More than three times a week    Frequency of Social Gatherings with Friends and Family: More than three times a week    Attends Religious Services: Not on Marketing executive or Organizations: Yes    Attends Engineer, structural: Not on file    Marital Status: Divorced    Objective:  BP 122/82   Pulse 70   Temp 98.3 F (36.8 C)   Ht 5' 3.5 (1.613 m)   Wt 220 lb 9.6 oz (100.1 kg)   SpO2  97%   BMI 38.46 kg/m      06/24/2024    8:55 AM 06/13/2024    9:20 AM 04/26/2024    1:50 PM  BP/Weight  Systolic BP 122 135 122  Diastolic BP 82 62 60  Wt. (Lbs) 220.6 222.2   BMI 38.46 kg/m2 38.74 kg/m2     Physical Exam Vitals and nursing note reviewed.  Constitutional:      Appearance: She is obese.  HENT:     Head: Normocephalic and atraumatic.  Eyes:     Pupils: Pupils are equal, round, and reactive to light.  Cardiovascular:      Rate and Rhythm: Normal rate and regular rhythm.  Pulmonary:     Effort: Pulmonary effort is normal.     Breath sounds: Normal breath sounds.  Musculoskeletal:        General: Swelling (trace bilateral pedal edema) present.  Skin:    General: Skin is dry.     Comments: Venous stasis changes both legs   Neurological:     General: No focal deficit present.     Mental Status: She is alert.  Psychiatric:        Mood and Affect: Mood normal.      Diabetic foot exam was performed with the following findings:   No deformities, ulcerations, or other skin breakdown Weaker DORSALIS PEDIS pulse in LEFT FOOT.  ABNORMAL MONOFILAMENT TESTING BILATERALLY.       Lab Results  Component Value Date   WBC 8.3 06/11/2024   HGB 13.3 06/11/2024   HCT 40.6 06/11/2024   PLT 204 06/11/2024   GLUCOSE 93 06/11/2024   CHOL 151 03/21/2024   TRIG 145 03/21/2024   HDL 45 03/21/2024   LDLCALC 81 03/21/2024   ALT 12 06/11/2024   AST 14 (L) 06/11/2024   NA 140 06/11/2024   K 4.2 06/11/2024   CL 105 06/11/2024   CREATININE 1.33 (H) 06/11/2024   BUN 47 (H) 06/11/2024   CO2 29 06/11/2024   TSH 2.870 03/21/2024   INR 1.0 05/13/2008   HGBA1C 6.4 06/24/2024      Assessment & Plan:  Primary hypertension Assessment & Plan: Blood pressure is well-controlled with current medication regimen of AMLODIPINE  5 mg daily and Atenolol  100 mg daily. Takes furosemide 20 mg ONE TABLET DAILY  STABLE KIDNEY FUNCTION CONSISTENT WITH CKD 3.   Controlled type 2 diabetes mellitus with complication, without long-term current use of insulin (HCC) Assessment & Plan: Type 2 diabetes mellitus, CONTROLLED WITH COMPLICATIONS OF CKD 3B AND DIABETIC PERIPHERAL NEUROPATHY, WITHOUT LONG TERM USE OF INSULIN. with recent improvement in glycemic control. A1c decreased from 7.3% in June to 6.4% currently. Neuropathy present in both feet with decreased sensation. No acute pain reported. - Increase Ozempic  to 1 mg weekly for type  2 diabetes management. - Perform A1c finger prick test today- came down to 6.4. - Encourage dietary modifications focusing on portion control and healthier food choices. - Encourage increased physical activity. - Monitor for symptoms of neuropathy and foot care.  Orders: -     POCT glycosylated hemoglobin (Hb A1C)  Hypothyroidism, unspecified type Assessment & Plan: Hypothyroidism is well-managed with current medication regimen of levothyroxine  75 mcg daily. Last thyroid function test was in June, and no repeat test is needed at this time.   Hyperlipidemia, unspecified hyperlipidemia type Assessment & Plan: Well controlled lipids. On ATORVASTATIN  80 mg daily  Will defer repeat lipids to next visit   Encounter for immunization -  Flu vaccine HIGH DOSE PF(Fluzone Trivalent)  Estrogen deficiency -     DG Bone Density; Future  Stage 3b chronic kidney disease (HCC) Assessment & Plan: Ckd stage 3b, due to diabetes type 2 Stable at this time  Will continue to monitor   Class 2 severe obesity due to excess calories with serious comorbidity and body mass index (BMI) of 38.0 to 38.9 in adult [facility]) Assessment & Plan: Obesity with recent weight loss. Weight decreased from 236 pounds in September last year to 220 pounds currently. - Encourage continued weight loss through diet and exercise. - Discuss the role of increased Ozempic  dose in aiding weight loss. She is on ozempic  due to her diabetes. -   Other orders -     Semaglutide  (1 MG/DOSE); Inject 1 mg as directed once a week.  Dispense: 9 mL; Refill: 1     Body mass index is 38.46 kg/m.     Venous stasis changes of lower extremities with xerosis cutis Venous stasis changes in lower extremities with dry skin and some loss of hair. Pulses are weaker in the left foot compared to the right. No acute issues noted. - Encourage regular application of lotion to manage dry skin. - Monitor for any changes in skin condition or  circulation.          Meds ordered this encounter  Medications   Semaglutide , 1 MG/DOSE, 4 MG/3ML SOPN    Sig: Inject 1 mg as directed once a week.    Dispense:  9 mL    Refill:  1    Orders Placed This Encounter  Procedures   DG Bone Density   Flu vaccine HIGH DOSE PF(Fluzone Trivalent)   POCT glycosylated hemoglobin (Hb A1C)       Follow-up: Return in about 6 months (around 12/22/2024) for chronic disease follow up.  An After Visit Summary was printed and given to the patient.  Baby Gieger, MD [facility] [phone]

## 2024-06-24 NOTE — Patient Instructions (Addendum)
          Contains text generated by Abridge.   VISIT SUMMARY: Today, we reviewed your type 2 diabetes management, weight loss progress, and overall health. Your blood sugar control has improved, and you have lost weight since your last visit. We also discussed your blood pressure, thyroid function, and skin care for your legs.  YOUR PLAN: TYPE 2 DIABETES MELLITUS WITH DIABETIC NEUROPATHY: Your blood sugar levels have improved, and your A1c has decreased. You have some decreased sensation in your feet but no pain. -Increase Ozempic  to 1 mg weekly. -Perform A1c finger prick test today. -Focus on portion control and healthier food choices. -Increase physical activity. -Monitor for symptoms of neuropathy and take care of your feet.  OBESITY: You have lost weight since your last visit. -Continue with diet and exercise to lose more weight. -The increased dose of Ozempic  may help with weight loss.  ESSENTIAL HYPERTENSION: Your blood pressure is well-controlled with your current medications. -Continue taking your current blood pressure medications.  HYPOTHYROIDISM: Your thyroid function is well-managed with your current medication. -Continue taking your thyroid medication as prescribed.  VENOUS STASIS CHANGES OF LOWER EXTREMITIES WITH XEROSIS CUTIS: You have dry skin and some changes in your lower legs. The pulses in your left foot are weaker than in your right. -Apply lotion regularly to manage dry skin. -Monitor for any changes in your skin condition or circulation.                      Contains text generated by Abridge.                                 Contains text generated by Abridge.

## 2024-06-24 NOTE — Assessment & Plan Note (Signed)
 Ckd stage 3b, due to diabetes type 2 Stable at this time  Will continue to monitor

## 2024-06-24 NOTE — Assessment & Plan Note (Signed)
 Type 2 diabetes mellitus, CONTROLLED WITH COMPLICATIONS OF CKD 3B AND DIABETIC PERIPHERAL NEUROPATHY, WITHOUT LONG TERM USE OF INSULIN. with recent improvement in glycemic control. A1c decreased from 7.3% in June to 6.4% currently. Neuropathy present in both feet with decreased sensation. No acute pain reported. - Increase Ozempic  to 1 mg weekly for type 2 diabetes management. - Perform A1c finger prick test today- came down to 6.4. - Encourage dietary modifications focusing on portion control and healthier food choices. - Encourage increased physical activity. - Monitor for symptoms of neuropathy and foot care.

## 2024-06-24 NOTE — Assessment & Plan Note (Signed)
 Well controlled lipids. On ATORVASTATIN  80 mg daily  Will defer repeat lipids to next visit

## 2024-07-23 ENCOUNTER — Encounter (HOSPITAL_BASED_OUTPATIENT_CLINIC_OR_DEPARTMENT_OTHER): Payer: Self-pay

## 2024-07-27 ENCOUNTER — Other Ambulatory Visit: Payer: Self-pay

## 2024-08-12 ENCOUNTER — Other Ambulatory Visit: Payer: Self-pay

## 2024-08-12 DIAGNOSIS — E118 Type 2 diabetes mellitus with unspecified complications: Secondary | ICD-10-CM

## 2024-08-12 MED ORDER — LANCETS MISC: 0 refills | Status: DC

## 2024-08-26 ENCOUNTER — Other Ambulatory Visit: Payer: Self-pay

## 2024-08-26 MED ORDER — ATENOLOL 50 MG PO TABS: 100.0000 mg | ORAL_TABLET | Freq: Every day | ORAL | 1 refills | Status: DC

## 2024-08-28 ENCOUNTER — Other Ambulatory Visit: Payer: Self-pay

## 2024-09-05 ENCOUNTER — Ambulatory Visit

## 2024-09-05 VITALS — BP 122/82 | Ht 63.5 in | Wt 222.0 lb

## 2024-09-05 DIAGNOSIS — Z0000 Encounter for general adult medical examination without abnormal findings: Secondary | ICD-10-CM

## 2024-09-05 NOTE — Patient Instructions (Signed)
 Sarah Bishop,  Thank you for taking the time for your Medicare Wellness Visit. I appreciate your continued commitment to your health goals. Please review the care plan we discussed, and feel free to reach out if I can assist you further.  Please note that Annual Wellness Visits do not include a physical exam. Some assessments may be limited, especially if the visit was conducted virtually. If needed, we may recommend an in-person follow-up with your provider.  Ongoing Care Seeing your primary care provider every 3 to 6 months helps us  monitor your health and provide consistent, personalized care.   Referrals If a referral was made during today's visit and you haven't received any updates within two weeks, please contact the referred provider directly to check on the status.  Recommended Screenings:  Health Maintenance  Topic Date Due   Osteoporosis screening with Bone Density Scan  Never done   COVID-19 Vaccine (8 - 2025-26 season) 06/03/2024   Medicare Annual Wellness Visit  09/03/2024   Eye exam for diabetics  09/04/2024   Zoster (Shingles) Vaccine (1 of 2) 09/23/2024*   Hemoglobin A1C  12/22/2024   Yearly kidney health urinalysis for diabetes  03/21/2025   Yearly kidney function blood test for diabetes  06/11/2025   Complete foot exam   06/24/2025   DTaP/Tdap/Td vaccine (2 - Td or Tdap) 01/09/2026   Pneumococcal Vaccine for age over 12  Completed   Flu Shot  Completed   Meningitis B Vaccine  Aged Out   Breast Cancer Screening  Discontinued  *Topic was postponed. The date shown is not the original due date.       04/17/2024    8:46 AM  Advanced Directives  Does Patient Have a Medical Advance Directive? No;Yes  Type of Advance Directive Living will;Healthcare Power of Attorney    Vision: Annual vision screenings are recommended for early detection of glaucoma, cataracts, and diabetic retinopathy. These exams can also reveal signs of chronic conditions such as diabetes and high  blood pressure.  Dental: Annual dental screenings help detect early signs of oral cancer, gum disease, and other conditions linked to overall health, including heart disease and diabetes.  Please see the attached documents for additional preventive care recommendations.

## 2024-09-05 NOTE — Progress Notes (Signed)
 I connected with  Sarah Bishop on 09/05/24 by a audio enabled telemedicine application and verified that I am speaking with the correct person using two identifiers.  Patient Location: Home  Provider Location: Home Office  Persons Participating in Visit: Patient.  I discussed the limitations of evaluation and management by telemedicine. The patient expressed understanding and agreed to proceed.  Vital Signs: Because this visit was a virtual/telehealth visit, some criteria may be missing or patient reported. Any vitals not documented were not able to be obtained and vitals that have been documented are patient reported.   Because this visit was a virtual/telehealth visit,  certain criteria was not obtained, such a blood pressure, CBG if applicable, and timed get up and go. Any medications not marked as taking were not mentioned during the medication reconciliation part of the visit. Any vitals not documented were not able to be obtained due to this being a telehealth visit or patient was unable to self-report a recent blood pressure reading due to a lack of equipment at home via telehealth. Vitals that have been documented are verbally provided by the patient.   This visit was performed by a medical professional under my direct supervision. I was immediately available for consultation/collaboration. I have reviewed and agree with the Annual Wellness Visit documentation.  Chief Complaint  Patient presents with   Medicare Wellness     Subjective:   Sarah Bishop is a 83 y.o. female who presents for a Medicare Annual Wellness Visit.  Visit info / Clinical Intake: Medicare Wellness Visit Type:: Subsequent Annual Wellness Visit Persons participating in visit and providing information:: patient Medicare Wellness Visit Mode:: Telephone If telephone:: video declined Since this visit was completed virtually, some vitals may be partially provided or unavailable. Missing vitals are due to the  limitations of the virtual format.: Documented vitals are patient reported If Telephone or Video please confirm:: I connected with patient using audio/video enable telemedicine. I verified patient identity with two identifiers, discussed telehealth limitations, and patient agreed to proceed. Patient Location:: home Provider Location:: home office Interpreter Needed?: No Pre-visit prep was completed: yes AWV questionnaire completed by patient prior to visit?: no Living arrangements:: with family/others Patient's Overall Health Status Rating: very good Typical amount of pain: none Does pain affect daily life?: no Are you currently prescribed opioids?: no  Dietary Habits and Nutritional Risks How many meals a day?: 3 Eats fruit and vegetables daily?: yes Most meals are obtained by: preparing own meals In the last 2 weeks, have you had any of the following?: none Diabetic:: (!) yes Any non-healing wounds?: no How often do you check your BS?: 2 Would you like to be referred to a Nutritionist or for Diabetic Management? : no  Functional Status Activities of Daily Living (to include ambulation/medication): Independent Ambulation: Independent with device- listed below Home Assistive Devices/Equipment: Johna Finder (specify Type) Medication Administration: Independent Home Management (perform basic housework or laundry): Independent Manage your own finances?: yes Primary transportation is: family / friends Concerns about vision?: no *vision screening is required for WTM* Concerns about hearing?: no  Fall Screening Falls in the past year?: 1 Number of falls in past year: 1 Was there an injury with Fall?: 1 Fall Risk Category Calculator: 3 Patient Fall Risk Level: High Fall Risk  Fall Risk Patient at Risk for Falls Due to: Impaired balance/gait; Impaired mobility; History of fall(s) Fall risk Follow up: Falls evaluation completed  Home and Transportation Safety: All rugs have  non-skid backing?:  N/A, no rugs All stairs or steps have railings?: yes Grab bars in the bathtub or shower?: yes Have non-skid surface in bathtub or shower?: yes Good home lighting?: yes Regular seat belt use?: yes Hospital stays in the last year:: no  Cognitive Assessment Difficulty concentrating, remembering, or making decisions? : yes Will 6CIT or Mini Cog be Completed: yes What year is it?: 0 points What month is it?: 0 points Give patient an address phrase to remember (5 components): [address] [place] About what time is it?: 0 points Count backwards from 20 to 1: 0 points Say the months of the year in reverse: 0 points Repeat the address phrase from earlier: 0 points 6 CIT Score: 0 points  Advance Directives (For Healthcare) Does Patient Have a Medical Advance Directive?: Yes Does patient want to make changes to medical advance directive?: No - Patient declined Type of Advance Directive: Healthcare Power of [place]; Living will; Out of facility DNR (pink MOST or yellow form) Copy of Healthcare Power of Attorney in Chart?: No - copy requested Copy of Living Will in Chart?: No - copy requested Out of facility DNR (pink MOST or yellow form) in Chart? (Ambulatory ONLY): No - copy requested  Reviewed/Updated  Reviewed/Updated: Reviewed All (Medical, Surgical, Family, Medications, Allergies, Care Teams, Patient Goals)    Allergies (verified) Augmentin [amoxicillin-pot clavulanate] and Iodinated contrast media   Current Medications (verified) Outpatient Encounter Medications as of 09/05/2024  Medication Sig   acetaminophen  (TYLENOL ) 500 MG tablet Take 500-1,000 mg by mouth every 6 (six) hours as needed (pain.).   allopurinol  (ZYLOPRIM ) 300 MG tablet TAKE 1 TABLET BY MOUTH DAILY   amLODipine  (NORVASC ) 5 MG tablet TAKE 1 TABLET BY MOUTH AT  BEDTIME   atenolol  (TENORMIN ) 50 MG tablet Take 2 tablets (100 mg total) by mouth at bedtime.   atorvastatin  (LIPITOR) 80 MG  tablet TAKE 1 TABLET BY MOUTH EVERY  OTHER DAY AT NIGHT   Blood Glucose Monitoring Suppl (ACCU-CHEK GUIDE) w/Device KIT 1 each by Does not apply route as directed.   cholecalciferol (VITAMIN D3) 25 MCG (1000 UNIT) tablet Take 2 tablets (2,000 Units total) by mouth at bedtime.   citalopram  (CELEXA ) 10 MG tablet TAKE 1 TABLET BY MOUTH IN THE  MORNING   empagliflozin  (JARDIANCE ) 25 MG TABS tablet Take 1 tablet (25 mg total) by mouth daily before breakfast.   furosemide (LASIX) 20 MG tablet Take 40 mg by mouth in the morning.   glucose blood test strip Use as instructed   Lancets (ONETOUCH DELICA PLUS LANCET33G) MISC APPLY TO AFFECTED AREA TOPICALLY EVERY DAY   Lancets MISC Use up to four times daily as directed. (FOR ICD-10 E10.9, E11.9).   levothyroxine  (SYNTHROID ) 75 MCG tablet Take 1 tablet (75 mcg total) by mouth daily before breakfast.   Semaglutide , 1 MG/DOSE, 4 MG/3ML SOPN Inject 1 mg as directed once a week.   No facility-administered encounter medications on file as of 09/05/2024.    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[facility])   Past Surgical History:  Procedure Laterality Date   BREAST BIOPSY Left    BREAST BIOPSY Left    DILATION AND CURETTAGE OF UTERUS     DILITATION & CURRETTAGE/HYSTROSCOPY WITH VERSAPOINT RESECTION N/A 12/26/2012   Procedure: DILATATION & CURETTAGE/HYSTEROSCOPY WITH VERSAPOINT RESECTION;  Surgeon:  Percilla Burly, MD;  Location: [facility];  Service: Gynecology;  Laterality: N/A;   IR IMAGING GUIDED PORT INSERTION  06/09/2022   IR REMOVAL TUN ACCESS W/ PORT W/O FL MOD SED  12/29/2022   JOINT REPLACEMENT     MANDIBLE FRACTURE SURGERY     ROBOTIC ASSISTED BILATERAL SALPINGO OOPHERECTOMY Bilateral 05/10/2022   Procedure: XI ROBOTIC ASSISTED BILATERAL SALPINGO OOPHORECTOMY, TOTAL HYSTERECTOMY, CYSTOSCOPY;  Surgeon: Viktoria Comer SAUNDERS, MD;  Location: [facility];  Service: Gynecology;  Laterality: Bilateral;   TOTAL KNEE ARTHROPLASTY Bilateral    Family History  Problem Relation Age of Onset   Heart disease Maternal Grandmother    Heart disease Maternal Grandfather    Stroke Maternal Grandfather    Colon cancer Maternal Grandfather        dx 18s   Breast cancer Other    Cancer Half-Brother        in spine   Ovarian cancer Neg Hx    Endometrial cancer Neg Hx    Pancreatic cancer Neg Hx    Prostate cancer Neg Hx    Social History   Occupational History   Not on file  Tobacco Use   Smoking status: Former    Current packs/day: 0.00    Types: Cigarettes    Quit date: 1999    Years since quitting: 26.9    Passive exposure: Past   Smokeless tobacco: Never  Vaping Use   Vaping status: Never Used  Substance and Sexual Activity   Alcohol use: Yes    Comment: rare   Drug use: No   Sexual activity: Not Currently   Tobacco Counseling Counseling given: Not Answered  SDOH Screenings   Food Insecurity: No Food Insecurity (09/05/2024)  Housing: Low Risk  (09/05/2024)  Transportation Needs: No Transportation Needs (09/05/2024)  Utilities: Not At Risk (09/05/2024)  Alcohol Screen: Low Risk  (09/04/2023)  Depression (PHQ2-9): Low Risk  (09/05/2024)  Financial Resource Strain: Low Risk  (09/04/2023)  Physical Activity: Patient Declined (09/05/2024)  Social Connections: Socially Isolated (09/05/2024)  Stress: No Stress Concern Present (09/05/2024)  Tobacco Use: Medium Risk (09/05/2024)  Health  Literacy: Adequate Health Literacy (09/05/2024)   See flowsheets for full screening details  Depression Screen PHQ 2 & 9 Depression Scale- Over the past 2 weeks, how often have you been bothered by any of the following problems? Little interest or pleasure in doing things: 0 Feeling down, depressed, or hopeless (PHQ Adolescent also includes...irritable): 0 PHQ-2 Total Score: 0 Trouble falling or staying asleep, or sleeping too much: 0 Feeling tired or having little energy: 0 Poor appetite or overeating (PHQ Adolescent also includes...weight loss): 0 Feeling bad about yourself - or that you are a failure or have let yourself or your family down: 0 Trouble concentrating on things, such as reading the newspaper or watching television (PHQ Adolescent also includes...like school work): 0 Moving or speaking so slowly that other people could have noticed. Or the opposite - being so fidgety or restless that you have been moving around a lot  more than usual: 0 Thoughts that you would be better off dead, or of hurting yourself in some way: 0 PHQ-9 Total Score: 0 If you checked off any problems, how difficult have these problems made it for you to do your work, take care of things at home, or get along with other people?: Not difficult at all  Depression Treatment Depression Interventions/Treatment : [id number] Score <4 Follow-up Not Indicated     Goals Addressed               This Visit's Progress     Patient Stated (pt-stated)        To be better at walking              Objective:    Today's Vitals   09/05/24 1011  BP: 122/82  Weight: 222 lb (100.7 kg)  Height: 5' 3.5 (1.613 m)   Body mass index is 38.71 kg/m.  Hearing/Vision screen Hearing Screening - Comments:: Patient wears hearing aids  Vision Screening - Comments:: Patient wears glasses and has an upcoming appointment 10/14/2024 with eye care center in [facility] Immunizations and Health Maintenance Health Maintenance   Topic Date Due   Bone Density Scan  Never done   COVID-19 Vaccine (8 - 2025-26 season) 06/03/2024   Medicare Annual Wellness (AWV)  09/03/2024   OPHTHALMOLOGY EXAM  09/04/2024   Zoster Vaccines- Shingrix (1 of 2) 09/23/2024 (Originally 03/20/1960)   HEMOGLOBIN A1C  12/22/2024   Diabetic kidney evaluation - Urine ACR  03/21/2025   Diabetic kidney evaluation - eGFR measurement  06/11/2025   FOOT EXAM  06/24/2025   DTaP/Tdap/Td (2 - Td or Tdap) 01/09/2026   Pneumococcal Vaccine: 50+ Years  Completed   Influenza Vaccine  Completed   Meningococcal B Vaccine  Aged Out   Mammogram  Discontinued        Assessment/Plan:  This is a routine wellness examination for Sarah Bishop.  Patient Care Team: [name], [name], MD as PCP - General (Family Medicine) Livingston Rigg, MD as Consulting Physician (Dermatology) Livingston Rigg, MD as Consulting Physician (Dermatology) Georjean Darice HERO, MD as Consulting Physician (Neurology) Lonn Hicks, MD as Consulting Physician (Hematology and Oncology) Viktoria Comer SAUNDERS, MD as Consulting Physician (Gynecologic Oncology) Dasie Ronal Caldron, AUD (Audiology) [facility], O.D., PA  I have personally reviewed and noted the following in the patient's chart:   Medical and social history Use of alcohol, tobacco or illicit drugs  Current medications and supplements including opioid prescriptions. Functional ability and status Nutritional status Physical activity Advanced directives List of other physicians Hospitalizations, surgeries, and ER visits in previous 12 months Vitals Screenings to include cognitive, depression, and falls Referrals and appointments  No orders of the defined types were placed in this encounter.  In addition, I have reviewed and discussed with patient certain preventive protocols, quality metrics, and best practice recommendations. A written personalized care plan for preventive services as well as general preventive health  recommendations were provided to patient.   Sarah Bishop, [place]   09/05/2024   No follow-ups on file.  After Visit Summary: (MyChart) Due to this being a telephonic visit, the after visit summary with patients personalized plan was offered to patient via MyChart   Nurse Notes: Patient has no questions or concerns at this time. She has an appointment scheduled for  12/23/2024. Patient states she has received the Covid vaccine this year but does not remember when and will discuss later. Eye exam is scheduled for 10/14/2024 so referral was not  placed at visit today.

## 2024-09-12 ENCOUNTER — Inpatient Hospital Stay: Attending: Gynecologic Oncology | Admitting: Gynecologic Oncology

## 2024-09-12 ENCOUNTER — Encounter: Payer: Self-pay | Admitting: Gynecologic Oncology

## 2024-09-12 ENCOUNTER — Other Ambulatory Visit: Payer: Self-pay | Admitting: Gynecologic Oncology

## 2024-09-12 ENCOUNTER — Inpatient Hospital Stay

## 2024-09-12 VITALS — BP 129/60 | HR 68 | Resp 19 | Wt 218.6 lb

## 2024-09-12 DIAGNOSIS — Z8544 Personal history of malignant neoplasm of other female genital organs: Secondary | ICD-10-CM | POA: Insufficient documentation

## 2024-09-12 DIAGNOSIS — C5702 Malignant neoplasm of left fallopian tube: Secondary | ICD-10-CM

## 2024-09-12 DIAGNOSIS — Z9221 Personal history of antineoplastic chemotherapy: Secondary | ICD-10-CM | POA: Diagnosis not present

## 2024-09-12 DIAGNOSIS — Z90710 Acquired absence of both cervix and uterus: Secondary | ICD-10-CM | POA: Diagnosis not present

## 2024-09-12 DIAGNOSIS — Z90722 Acquired absence of ovaries, bilateral: Secondary | ICD-10-CM | POA: Insufficient documentation

## 2024-09-12 DIAGNOSIS — Z9079 Acquired absence of other genital organ(s): Secondary | ICD-10-CM | POA: Insufficient documentation

## 2024-09-12 NOTE — Progress Notes (Signed)
 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  No significant cystic component or ascites.  Renal lesion of the right kidney lower pole is thought to represent a benign lesion.  1.3 cm cystic lesion of the head of the pancreas is recommended to be follow-up with pancreatic MRI in 2 years.   05/02/2022 Tumor Marker   Patient's tumor was tested for the following markers: CA-125. Results of the tumor marker test revealed 69.   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 Fullness appreciated within the left cul de sac. On intra-abdominal entry, normal upper abdominal survey including liver edge, diaphragm, and stomach. Normal appearing omentum. No ascites. Normal appearing small bowel. Significant inflammation within the pelvis concerning initially for recent diverticulitis versus endometriosis. Thickened and inflamed pelvic peritoneum. Filmy adhesions between the anterior cul de sac and uterus, between the right adnexa and and the sigmoid colon. Right adnexa inflammed but otherwise normal in appearing. Left fallopian tube dilated with evidence of hydrosalpinx. Friable adhesions of the fallopian tube to the sigmoid mesentery, left ovary and left broad ligament. No ascites. Uterus bulbous and 10cm with findings of endosalpingiosis versus  endometriosis on much of the uterine serosa. Dense adhesions between the bladder and cervix. Cul de sac without evidence of disease. No ascites.  Cystoscopy findings: bladder dome intact, normal efflux from bilateral ureteral orifices. On frozen section, no abnormality of the endometrium. Anterior cul de sac adhesion with findings of hemosiderin, no definitive malignancy. Fallopian tube with malignancy.  Given findings and pelvic peritoneal involvement given fallopian tube adherent to surrounding structures, no upper abdominal findings, normal omentum, and normal neuroassessment intraoperatively as well as on preoperative imaging, lymphadenectomy deferred.   05/10/2022 Pathology Results   Procedure: Hysterectomy with bilateral salpingo-oophorectomy  Specimen Integrity: Intact  Tumor Site: Left fallopian tube  Tumor Size: 6.3 x 3.8 x 2.8 cm  Histologic Type: Serous carcinoma  Histologic Grade: High-grade  Ovarian Surface Involvement: Not identified  Fallopian Tube Surface Involvement: Not identified  Implants (required for advanced stage serous/seromucinous borderline  tumors only): Not applicable  Other Tissue/ Organ Involvement: Not applicable  Largest Extrapelvic Peritoneal Focus: Not applicable  Peritoneal/Ascitic Fluid Involvement: Not identified [facility] [phone])  Chemotherapy Response Score (CRS): Not applicable, no known presurgical  therapy  Regional Lymph Nodes: Not applicable (no lymph nodes submitted or found)  Distant Metastasis: Not applicable  Pathologic Stage Classification (pTNM, AJCC 8th Edition): pT1a, pN n/a  Ancillary Studies: Available upon request  Representative Tumor Block: B15  Comment(s): An immunohistochemistry stain for p53 is performed with  adequate control and is diffusely positive within the tumor.  The same  stain shows patchy variable staining within the reactive mesothelium  lining the serosal adhesions within the anterior cul-de-sac    05/17/2022 Initial  Diagnosis   Fallopian tube cancer, carcinoma, left (HCC)   05/30/2022 Cancer Staging   Staging form: Ovary, Fallopian Tube, and Primary Peritoneal Carcinoma,  AJCC 8th Edition - Pathologic stage from 05/30/2022: FIGO Stage IIB (pT2b, pN0, cM0) - Signed by Lonn Hicks, MD on 05/30/2022 Stage prefix: Initial diagnosis   06/10/2022 - 11/11/2022 Chemotherapy   Patient is on Treatment Plan : OVARIAN Carboplatin  (AUC 6) + Paclitaxel  (175) q21d X 6 Cycles     06/10/2022 Procedure   Placement of a subcutaneous power-injectable port device. Catheter tip at the superior cavoatrial junction.      Genetic Testing   Negative genetic testing. No pathogenic variants identified on the [facility] Multi-Cancer+RNA panel. The report date is 09/06/2022.   The Multi-Cancer + RNA Panel offered by [facility] includes sequencing and/or deletion/duplication analysis of the following 70 genes:  AIP*, ALK, APC*, ATM*, AXIN2*, BAP1*, BARD1*, BLM*, BMPR1A*, BRCA1*, BRCA2*, BRIP1*, CDC73*, CDH1*, CDK4, CDKN1B*, CDKN2A, CHEK2*, CTNNA1*, DICER1*, EPCAM, EGFR, FH*, FLCN*, GREM1, HOXB13, KIT, LZTR1, MAX*, MBD4, MEN1*, MET, MITF, MLH1*, MSH2*, MSH3*, MSH6*, MUTYH*, NF1*, NF2*, NTHL1*, PALB2*, PDGFRA, PMS2*, POLD1*, POLE*, POT1*, PRKAR1A*, PTCH1*, PTEN*, RAD51C*, RAD51D*, RB1*, RET, SDHA*, SDHAF2*, SDHB*, SDHC*, SDHD*, SMAD4*, SMARCA4*, SMARCB1*, SMARCE1*, STK11*, SUFU*, TMEM127*, TP53*, TSC1*, TSC2*, VHL*. RNA analysis is performed for * genes.   11/14/2022 Tumor Marker   Patient's tumor was tested for the following markers: CA-125. Results of the tumor marker test revealed 13.9.   12/12/2022 Imaging   1. No findings in the pelvis to suggest local recurrence. No evidence for metastatic disease in the abdomen or pelvis. 2. The cystic pancreatic head lesion seen on MRI is not well demonstrated by CT but measures approximately 10 mm. Continued attention on restaging scans recommended. Previous MRI recommended 2 year interval MRI follow-up. 3.  Cholelithiasis. 4.  Aortic Atherosclerosis (ICD10-I70.0).   01/02/2023 Procedure   Patient had successful removal of port    03/23/2023 Tumor Marker   Patient's tumor was tested for the following markers: CA-125. Results of the tumor marker test revealed 12.1.   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 Restaging assessment. * Tracking Code: BO * EXAM: CT ABDOMEN AND PELVIS WITHOUT CONTRAST TECHNIQUE: Multidetector CT imaging of the abdomen and pelvis was performed following the standard protocol without IV contrast. RADIATION DOSE REDUCTION: This exam was performed according to the departmental dose-optimization program which includes automated exposure control, adjustment of the mA and/or kV according to patient size and/or use of iterative reconstruction technique. COMPARISON:  12/09/2022 FINDINGS: Lower chest: Coronary and descending thoracic aortic atheromatous vascular disease. Mitral valve calcification. Hepatobiliary: Small depending gallstones in the gallbladder. No biliary dilatation. Otherwise unremarkable. Pancreas: The cystic lesion along the uncinate process previously seen on MRI is very poorly appreciable on CT today but appears roughly stable at about 0.9 cm on image 34 series 2. Spleen: Unremarkable Adrenals/Urinary Tract: Cysts of varying complexity as shown on MRI from 03/29/2022, these have been previously shown to be benign and require no further workup. Bilateral renal atrophy. Adrenal glands normal. Urinary bladder empty. Stomach/Bowel: Sigmoid colon diverticulosis. Chronic mild prominence of the appendix especially centrally or the appendiceal diameter is up to 0.9 cm on image 50 of series 2, this is been present at least since 07/13/2004 and is presumably incidental given the long-chronicity. Vascular/Lymphatic: Atherosclerosis is  present, including aortoiliac atherosclerotic disease. Atheromatous vascular calcification proximally in the superior mesenteric artery noted. No pathologic adenopathy. Reproductive: Uterus absent.  Adnexa unremarkable. Other: No supplemental non-categorized findings. Musculoskeletal: Lumbar spondylosis and degenerative  disc disease causing mild multilevel impingement. 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 IMPRESSION: 1. No findings of active malignancy. 2. Aortic atherosclerosis. 3. Cholelithiasis. 4. Sigmoid colon diverticulosis. 5. Chronic mild prominence of the appendix especially centrally, this has been present at least since 07/13/2004 and is presumably incidental given the long-chronicity. 6. 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 7. Lumbar spondylosis and degenerative disc disease causing mild multilevel impingement. 8. The cystic lesion along the uncinate process of the pancreas previously seen on MRI is very poorly appreciable on CT today but appears roughly stable at about 0.9 cm. Aortic Atherosclerosis (ICD10-I70.0). Electronically Signed   By: Ryan Salvage M.D.   On: 06/14/2023 16:12      09/28/2023 Tumor Marker   Patient's tumor was tested for the following markers: CA-125. Results of the tumor marker test revealed 14.3.   06/11/2024 Imaging   CT ABDOMEN PELVIS WO CONTRAST Result Date: 06/12/2024 CLINICAL DATA:  Ovarian cancer screening high risk patient EXAM: CT ABDOMEN AND PELVIS WITHOUT CONTRAST TECHNIQUE: Multidetector CT imaging of the abdomen and pelvis was performed following the standard protocol without IV contrast. RADIATION DOSE REDUCTION: This exam was performed according to the departmental dose-optimization program which includes automated exposure control, adjustment of the mA and/or kV according to patient size and/or use of iterative reconstruction technique. COMPARISON:  CT 06/13/2023, 12/09/2022,  03/18/2022, MRI 03/29/2022 FINDINGS: Lower chest: Lung bases are clear.  Mitral calcifications. Hepatobiliary: Small gallstones. No focal hepatic abnormality. No biliary dilatation Pancreas: No inflammation. MRI demonstrated cystic lesion at the uncinate process is grossly stable, measuring about 10 mm on series 2, image 29. Spleen: Normal in size without focal abnormality. Adrenals/Urinary Tract: Adrenal glands are normal. Bilateral renal cysts and subcentimeter hypodensities for which no imaging follow-up is recommended. 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 Renal atrophy. Bladder is normal Stomach/Bowel: Stomach within normal limits. No dilated small bowel. No acute bowel wall thickening. Chronic prominence of the a appendix measuring up to 9 mm on series 2, image 46, no inflammation. Diverticular disease of the colon Vascular/Lymphatic: Aortic atherosclerosis. No enlarged abdominal or pelvic lymph nodes. Reproductive: Hysterectomy.  No suspicious adnexal lesions Other: No ascites.  No free air Musculoskeletal: No acute or suspicious osseous abnormality. Multilevel degenerative changes. Moderate severe bilateral hip arthritis IMPRESSION: 1. No CT evidence for acute intra-abdominal or pelvic abnormality. No evidence for recurrent or metastatic disease allowing for absence of contrast 2. Gallstones. 3. Diverticular disease of the colon without acute inflammatory process. 4. Grossly stable 10 mm cystic lesion uncinate process of the pancreas, reference MRI 03/29/2022. 5. Aortic atherosclerosis. Aortic Atherosclerosis (ICD10-I70.0). Electronically Signed   By: Luke Bun M.D.   On: 06/12/2024 16:02      06/12/2024 Tumor Marker   Patient's tumor was tested for the following markers: CA-125. Results of the tumor marker test revealed 11.8.     Interval History: Doing well.  Having some  constipation, which she attributes to Ozempic .  Continues to struggle with urinary incontinence and frequency.  Denies any abdominal or pelvic pain.  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[facility])    Past Surgical History:  Procedure Laterality Date   BREAST BIOPSY Left    BREAST BIOPSY Left    DILATION AND CURETTAGE OF UTERUS     DILITATION & CURRETTAGE/HYSTROSCOPY WITH VERSAPOINT RESECTION N/A 12/26/2012   Procedure: DILATATION & CURETTAGE/HYSTEROSCOPY WITH VERSAPOINT RESECTION;  Surgeon: Percilla Burly, MD;  Location: [facility];  Service: Gynecology;  Laterality: N/A;   IR IMAGING GUIDED PORT INSERTION  06/09/2022   IR REMOVAL TUN ACCESS W/ PORT W/O FL MOD SED  12/29/2022   JOINT REPLACEMENT     MANDIBLE FRACTURE SURGERY     ROBOTIC ASSISTED BILATERAL SALPINGO OOPHERECTOMY Bilateral 05/10/2022   Procedure: XI ROBOTIC ASSISTED BILATERAL SALPINGO OOPHORECTOMY, TOTAL HYSTERECTOMY, CYSTOSCOPY;  Surgeon: Viktoria Comer SAUNDERS, MD;  Location: [facility];  Service: Gynecology;  Laterality: Bilateral;   TOTAL KNEE ARTHROPLASTY Bilateral     Family History  Problem Relation Age of Onset   Heart disease Maternal Grandmother    Heart disease Maternal Grandfather    Stroke Maternal Grandfather    Colon cancer Maternal Grandfather        dx 65s   Breast cancer Other    Cancer Half-Brother        in spine   Ovarian  cancer Neg Hx    Endometrial cancer Neg Hx    Pancreatic cancer Neg Hx    Prostate cancer Neg Hx     Social History   Socioeconomic History   Marital status: Divorced    Spouse name: Not on file   Number of children: Not on file   Years of education: Not on file   Highest education level: Not on file  Occupational History   Not on file  Tobacco Use   Smoking status: Former    Current packs/day: 0.00    Types: Cigarettes    Quit date: 1999    Years since quitting: 26.9    Passive exposure: Past   Smokeless tobacco: Never  Vaping Use   Vaping status: Never Used  Substance and Sexual Activity   Alcohol use: Yes    Comment: rare   Drug use: No   Sexual activity: Not Currently  Other Topics Concern   Not on file  Social History Narrative   ** Merged History Encounter **    Are you right handed or left handed? Right    Are you currently employed ? no   What is your current occupation?na   Do you live at home alone? no   Who lives with you? Lives daughter    What type of home do you live in: 1 story or 2 story?  3 story has a  lift. Lives on main level        Social Drivers of Health   Tobacco Use: Medium Risk (09/05/2024)   Patient History    Smoking Tobacco Use: Former    Smokeless Tobacco Use: Never    Passive Exposure: Past  Physicist, Medical Strain: Low Risk (09/04/2023)   Overall Financial Resource Strain (CARDIA)    Difficulty of Paying Living Expenses: Not very hard  Food Insecurity:  No Food Insecurity (09/05/2024)   Epic    Worried About Programme Researcher, Broadcasting/film/video in the Last Year: Never true    Ran Out of Food in the Last Year: Never true  Transportation Needs: No Transportation Needs (09/05/2024)   Epic    Lack of Transportation (Medical): No    Lack of Transportation (Non-Medical): No  Physical Activity: Patient Declined (09/05/2024)   Exercise Vital Sign    Days of Exercise per Week: Patient declined    Minutes of Exercise per Session: Patient declined   Stress: No Stress Concern Present (09/05/2024)   [employer] of Occupational Health - Occupational Stress Questionnaire    Feeling of Stress: Not at all  Social Connections: Socially Isolated (09/05/2024)   Social Connection and Isolation Panel    Frequency of Communication with Friends and Family: More than three times a week    Frequency of Social Gatherings with Friends and Family: More than three times a week    Attends Religious Services: Never    Database Administrator or Organizations: No    Attends Banker Meetings: Never    Marital Status: Divorced  Depression (PHQ2-9): Low Risk (09/05/2024)   Depression (PHQ2-9)    PHQ-2 Score: 0  Alcohol Screen: Low Risk (09/04/2023)   Alcohol Screen    Last Alcohol Screening Score (AUDIT): 0  Housing: Low Risk (09/05/2024)   Epic    Unable to Pay for Housing in the Last Year: No    Number of Times Moved in the Last Year: 0    Homeless in the Last Year: No  Utilities: Not At Risk (09/05/2024)   Epic    Threatened with loss of utilities: No  Health Literacy: Adequate Health Literacy (09/05/2024)   B1300 Health Literacy    Frequency of need for help with medical instructions: Never    Current Medications: Current Medications[1]  Review of Systems: + Hearing loss, constipation, frequency, incontinence, leg swelling, joint pain, muscle pain/cramps Denies appetite changes, fevers, chills, fatigue, unexplained weight changes. Denies neck lumps or masses, mouth sores, ringing in ears or voice changes. Denies cough or wheezing.  Denies shortness of breath. Denies chest pain or palpitations.  Denies abdominal distention, pain, blood in stools, diarrhea, nausea, vomiting, or early satiety. Denies pain with intercourse, dysuria, hematuria. Denies hot flashes, pelvic pain, vaginal bleeding or vaginal discharge.   Denies itching, rash, or wounds. Denies dizziness, headaches, numbness or seizures. Denies swollen lymph nodes or  glands, denies easy bruising or bleeding. Denies anxiety, depression, confusion, or decreased concentration.  Physical Exam: BP 129/60 (BP Location: Left Arm, Patient Position: Sitting)   Pulse 68   Resp 19   Wt 218 lb 9.6 oz (99.2 kg)   SpO2 97%   BMI 38.12 kg/m  General: Alert, oriented, no acute distress. HEENT: Normocephalic, atraumatic, sclera anicteric. Chest: Clear to auscultation bilaterally.  No wheezes or rhonchi. Cardiovascular: regular rate and rhythm, no murmurs. Abdomen: Obese, soft, nontender.  Normoactive bowel sounds.  No masses or hepatosplenomegaly appreciated.  Well-healed incisions. Extremities: Grossly normal range of motion.  Warm, well perfused.  Trace edema bilaterally. Skin: No rashes or lesions noted. Lymphatics: No cervical, supraclavicular, or inguinal adenopathy. Shotty submandibular lymph nodes. GU: Normal appearing external genitalia without erythema, excoriation, or lesions.  Speculum exam reveals mildly atrophic vaginal mucosa, no lesions, cuff intact.  Bimanual exam reveals no masses or nodularity.  Rectovaginal exam confirms these findings. Some hard stool palpated in the rectum.  Laboratory & Radiologic  Studies:          Component Ref Range & Units (hover) 3 mo ago (06/11/24) 4 mo ago (04/26/24) 7 mo ago (01/19/24) 11 mo ago (09/25/23) 1 yr ago (06/13/23) 1 yr ago (03/21/23) 1 yr ago (11/11/22)  Cancer Antigen (CA) 125 11.8 11.6 CM 11.3 CM 14.3 CM 14.1 CM 12.1 CM 13.9 CM     Assessment & Plan: Sarah Bishop is a 83 y.o. 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 PDS 05/2022. Completed adj chemotherapy 11/2022. Negative germline. HRD negative. HER2 1+. Last imaging 06/2024, no findings of recurrent disease. Stable 1 cm cystic lesion in the uncinate process of the pancreas.   Doing well, NED on exam today. CA125 drawn today.   Per NCCN surveillance recommendations, we will continue with visits  every 2-4 months.  The patient is scheduled to see Dr. Lonn in September 2025.  I will see her for follow-up in December.  We reviewed signs and symptoms that should prompt a phone call before her next visit.  20 minutes of total time was spent for this patient encounter, including preparation, face-to-face counseling with the patient and coordination of care, and documentation of the encounter.  Comer Dollar, MD  Division of Gynecologic Oncology  Department of Obstetrics and Gynecology  [facility]      [1]  Current Outpatient Medications:    acetaminophen  (TYLENOL ) 500 MG tablet, Take 500-1,000 mg by mouth every 6 (six) hours as needed (pain.)., Disp: , Rfl:    allopurinol  (ZYLOPRIM ) 300 MG tablet, TAKE 1 TABLET BY MOUTH DAILY, Disp: 100 tablet, Rfl: 2   amLODipine  (NORVASC ) 5 MG tablet, TAKE 1 TABLET BY MOUTH AT  BEDTIME, Disp: 100 tablet, Rfl: 2   atenolol  (TENORMIN ) 50 MG tablet, Take 2 tablets (100 mg total) by mouth at bedtime., Disp: 180 tablet, Rfl: 1   atorvastatin  (LIPITOR) 80 MG tablet, TAKE 1 TABLET BY MOUTH EVERY  OTHER DAY AT NIGHT, Disp: 50 tablet, Rfl: 2   Blood Glucose Monitoring Suppl (ACCU-CHEK GUIDE) w/Device KIT, 1 each by Does not apply route as directed., Disp: 1 kit, Rfl: 0   cholecalciferol (VITAMIN D3) 25 MCG (1000 UNIT) tablet, Take 2 tablets (2,000 Units total) by mouth at bedtime., Disp: 60 tablet, Rfl: 1   citalopram  (CELEXA ) 10 MG tablet, TAKE 1 TABLET BY MOUTH IN THE  MORNING, Disp: 90 tablet, Rfl: 3   empagliflozin  (JARDIANCE ) 25 MG TABS tablet, Take 1 tablet (25 mg total) by mouth daily before breakfast., Disp: 90 tablet, Rfl: 1   furosemide (LASIX) 20 MG tablet, Take 40 mg by mouth in the morning., Disp: , Rfl:    glucose blood test strip, Use as instructed, Disp: 100 each, Rfl: 12   Lancets (ONETOUCH DELICA PLUS LANCET33G) MISC, APPLY TO AFFECTED AREA TOPICALLY EVERY DAY, Disp: 100 each, Rfl: 2   Lancets MISC, Use up to  four times daily as directed. (FOR ICD-10 E10.9, E11.9)., Disp: 100 each, Rfl: 0   levothyroxine  (SYNTHROID ) 75 MCG tablet, Take 1 tablet (75 mcg total) by mouth daily before breakfast., Disp: 90 tablet, Rfl: 3   Semaglutide , 1 MG/DOSE, 4 MG/3ML SOPN, Inject 1 mg as directed once a week., Disp: 9 mL, Rfl: 1

## 2024-09-12 NOTE — Patient Instructions (Signed)
 It was good to see you today.  I do not see or feel any evidence of cancer recurrence on your exam.  We will see you for follow-up in 2 months.  As always, if you develop any new and concerning symptoms before your next visit, please call to see me sooner.

## 2024-10-15 ENCOUNTER — Other Ambulatory Visit: Payer: Self-pay

## 2024-10-15 ENCOUNTER — Telehealth: Payer: Self-pay

## 2024-10-15 DIAGNOSIS — E118 Type 2 diabetes mellitus with unspecified complications: Secondary | ICD-10-CM

## 2024-10-15 MED ORDER — ACCU-CHEK GUIDE W/DEVICE KIT: 1.0000 | PACK | 0 refills | Status: AC

## 2024-10-15 MED ORDER — ACCU-CHEK GUIDE W/DEVICE KIT: 1.0000 | PACK | 0 refills | Status: DC

## 2024-10-15 MED ORDER — LANCETS MISC: 0 refills | Status: AC

## 2024-10-15 MED ORDER — ONETOUCH DELICA PLUS LANCET33G MISC: 2 refills | Status: DC

## 2024-10-15 NOTE — Telephone Encounter (Signed)
 Copied from CRM [id number]. Topic: Clinical - Prescription Issue >> Oct 15, 2024  4:11 PM Amy B wrote: Reason for CRM: Patient states order for  Blood Glucose Monitoring Suppl (ACCU-CHEK GUIDE) w/Device KIT and Lancets (ONETOUCH DELICA PLUS LANCET33G) MISC  were sent to the wrong pharmacy and need to be sent mail order through:  [facility] - [place], [place] - [address] [facility] [address] [place] [address] [place] [address]  Phone: [phone] Fax: [phone]

## 2024-10-15 NOTE — Telephone Encounter (Signed)
 Copied from CRM [id number]. Topic: Clinical - Medication Refill >> Oct 15, 2024 12:18 PM [name] wrote: Medication: Blood Glucose Monitoring Suppl (ACCU-CHEK GUIDE) w/Device KIT [[id number]]   Has the patient contacted their pharmacy? Yes (Agent: If no, request that the patient contact the pharmacy for the refill. If patient does not wish to contact the pharmacy document the reason why and proceed with request.) (Agent: If yes, when and what did the pharmacy advise?)  This is the patient's preferred pharmacy:  [id number] - [place], [place] - [address] [facility] [place] [address] Phone: [phone] Fax: [phone]  [facility]) - [place], [place] - [address] [facility] [address] [place] [address] [place] [address] Phone: [phone] Fax: [phone]   Is this the correct pharmacy for this prescription? Yes If no, delete pharmacy and type the correct one.   Has the prescription been filled recently? No  Is the patient out of the medication? No  Has the patient been seen for an appointment in the last year OR does the patient have an upcoming appointment? Yes  Can we respond through MyChart? Yes  Agent: Please be advised that Rx refills may take up to 3 business days. We ask that you follow-up with your pharmacy.

## 2024-10-22 ENCOUNTER — Other Ambulatory Visit: Payer: Self-pay

## 2024-10-22 DIAGNOSIS — E118 Type 2 diabetes mellitus with unspecified complications: Secondary | ICD-10-CM

## 2024-10-22 MED ORDER — ONETOUCH DELICA PLUS LANCET33G MISC: 2 refills | Status: AC

## 2024-10-30 ENCOUNTER — Other Ambulatory Visit: Payer: Self-pay

## 2024-11-01 ENCOUNTER — Encounter: Payer: Self-pay | Admitting: Neurology

## 2024-11-05 ENCOUNTER — Inpatient Hospital Stay (HOSPITAL_COMMUNITY)
Admission: EM | Admit: 2024-11-05 | Source: Home / Self Care | Attending: Internal Medicine | Admitting: Internal Medicine

## 2024-11-05 ENCOUNTER — Emergency Department (HOSPITAL_COMMUNITY)

## 2024-11-05 ENCOUNTER — Telehealth: Admitting: Neurology

## 2024-11-05 ENCOUNTER — Encounter: Payer: Self-pay | Admitting: Neurology

## 2024-11-05 ENCOUNTER — Encounter (HOSPITAL_COMMUNITY): Payer: Self-pay | Admitting: Internal Medicine

## 2024-11-05 VITALS — Ht 63.0 in | Wt 227.0 lb

## 2024-11-05 DIAGNOSIS — T426X5A Adverse effect of other antiepileptic and sedative-hypnotic drugs, initial encounter: Secondary | ICD-10-CM

## 2024-11-05 DIAGNOSIS — T68XXXA Hypothermia, initial encounter: Secondary | ICD-10-CM | POA: Diagnosis not present

## 2024-11-05 DIAGNOSIS — E039 Hypothyroidism, unspecified: Secondary | ICD-10-CM | POA: Diagnosis present

## 2024-11-05 DIAGNOSIS — Z8669 Personal history of other diseases of the nervous system and sense organs: Secondary | ICD-10-CM

## 2024-11-05 DIAGNOSIS — R4182 Altered mental status, unspecified: Secondary | ICD-10-CM | POA: Diagnosis not present

## 2024-11-05 DIAGNOSIS — R4701 Aphasia: Secondary | ICD-10-CM

## 2024-11-05 DIAGNOSIS — R410 Disorientation, unspecified: Principal | ICD-10-CM

## 2024-11-05 DIAGNOSIS — I10 Essential (primary) hypertension: Secondary | ICD-10-CM | POA: Diagnosis present

## 2024-11-05 DIAGNOSIS — E785 Hyperlipidemia, unspecified: Secondary | ICD-10-CM | POA: Diagnosis present

## 2024-11-05 DIAGNOSIS — G40901 Epilepsy, unspecified, not intractable, with status epilepticus: Secondary | ICD-10-CM | POA: Diagnosis present

## 2024-11-05 DIAGNOSIS — R569 Unspecified convulsions: Secondary | ICD-10-CM

## 2024-11-05 DIAGNOSIS — G40909 Epilepsy, unspecified, not intractable, without status epilepticus: Secondary | ICD-10-CM | POA: Diagnosis not present

## 2024-11-05 DIAGNOSIS — N183 Chronic kidney disease, stage 3 unspecified: Secondary | ICD-10-CM | POA: Diagnosis present

## 2024-11-05 DIAGNOSIS — E118 Type 2 diabetes mellitus with unspecified complications: Secondary | ICD-10-CM | POA: Diagnosis present

## 2024-11-05 DIAGNOSIS — C5702 Malignant neoplasm of left fallopian tube: Secondary | ICD-10-CM | POA: Diagnosis present

## 2024-11-05 DIAGNOSIS — I890 Lymphedema, not elsewhere classified: Secondary | ICD-10-CM

## 2024-11-05 DIAGNOSIS — G4733 Obstructive sleep apnea (adult) (pediatric): Secondary | ICD-10-CM

## 2024-11-05 LAB — COMPREHENSIVE METABOLIC PANEL WITH GFR
ALT: 40 U/L (ref 0–44)
AST: 39 U/L (ref 15–41)
Albumin: 4.2 g/dL (ref 3.5–5.0)
Alkaline Phosphatase: 100 U/L (ref 38–126)
Anion gap: 12 (ref 5–15)
BUN: 53 mg/dL — ABNORMAL HIGH (ref 8–23)
CO2: 26 mmol/L (ref 22–32)
Calcium: 10.5 mg/dL — ABNORMAL HIGH (ref 8.9–10.3)
Chloride: 101 mmol/L (ref 98–111)
Creatinine, Ser: 1.52 mg/dL — ABNORMAL HIGH (ref 0.44–1.00)
GFR, Estimated: 33 mL/min — ABNORMAL LOW
Glucose, Bld: 99 mg/dL (ref 70–99)
Potassium: 4.1 mmol/L (ref 3.5–5.1)
Sodium: 138 mmol/L (ref 135–145)
Total Bilirubin: 0.5 mg/dL (ref 0.0–1.2)
Total Protein: 7.1 g/dL (ref 6.5–8.1)

## 2024-11-05 LAB — TSH: TSH: 3.23 u[IU]/mL (ref 0.350–4.500)

## 2024-11-05 LAB — APTT: aPTT: 33 s (ref 24–36)

## 2024-11-05 LAB — URINALYSIS, ROUTINE W REFLEX MICROSCOPIC
Bilirubin Urine: NEGATIVE
Glucose, UA: 150 mg/dL — AB
Hgb urine dipstick: NEGATIVE
Ketones, ur: NEGATIVE mg/dL
Leukocytes,Ua: NEGATIVE
Nitrite: NEGATIVE
Protein, ur: NEGATIVE mg/dL
Specific Gravity, Urine: 1.013 (ref 1.005–1.030)
pH: 5 (ref 5.0–8.0)

## 2024-11-05 LAB — CBC WITH DIFFERENTIAL/PLATELET
Abs Immature Granulocytes: 0.15 10*3/uL — ABNORMAL HIGH (ref 0.00–0.07)
Basophils Absolute: 0.1 10*3/uL (ref 0.0–0.1)
Basophils Relative: 1 %
Eosinophils Absolute: 0.3 10*3/uL (ref 0.0–0.5)
Eosinophils Relative: 4 %
HCT: 37.3 % (ref 36.0–46.0)
Hemoglobin: 12.6 g/dL (ref 12.0–15.0)
Immature Granulocytes: 2 %
Lymphocytes Relative: 20 %
Lymphs Abs: 1.6 10*3/uL (ref 0.7–4.0)
MCH: 32.1 pg (ref 26.0–34.0)
MCHC: 33.8 g/dL (ref 30.0–36.0)
MCV: 95.2 fL (ref 80.0–100.0)
Monocytes Absolute: 0.4 10*3/uL (ref 0.1–1.0)
Monocytes Relative: 5 %
Neutro Abs: 5.5 10*3/uL (ref 1.7–7.7)
Neutrophils Relative %: 68 %
Platelets: 124 10*3/uL — ABNORMAL LOW (ref 150–400)
RBC: 3.92 MIL/uL (ref 3.87–5.11)
RDW: 15.7 % — ABNORMAL HIGH (ref 11.5–15.5)
WBC: 8.1 10*3/uL (ref 4.0–10.5)
nRBC: 0.2 % (ref 0.0–0.2)

## 2024-11-05 LAB — I-STAT CHEM 8, ED
BUN: 52 mg/dL — ABNORMAL HIGH (ref 8–23)
Calcium, Ion: 1.33 mmol/L (ref 1.15–1.40)
Chloride: 102 mmol/L (ref 98–111)
Creatinine, Ser: 1.7 mg/dL — ABNORMAL HIGH (ref 0.44–1.00)
Glucose, Bld: 93 mg/dL (ref 70–99)
HCT: 38 % (ref 36.0–46.0)
Hemoglobin: 12.9 g/dL (ref 12.0–15.0)
Potassium: 3.9 mmol/L (ref 3.5–5.1)
Sodium: 137 mmol/L (ref 135–145)
TCO2: 25 mmol/L (ref 22–32)

## 2024-11-05 LAB — GLUCOSE, CAPILLARY: Glucose-Capillary: 79 mg/dL (ref 70–99)

## 2024-11-05 LAB — ETHANOL: Alcohol, Ethyl (B): <15 mg/dL

## 2024-11-05 LAB — PROTIME-INR
INR: 1 (ref 0.8–1.2)
Prothrombin Time: 14.1 s (ref 11.4–15.2)

## 2024-11-05 LAB — I-STAT CG4 LACTIC ACID, ED: Lactic Acid, Venous: 0.5 mmol/L (ref 0.5–1.9)

## 2024-11-05 LAB — CBG MONITORING, ED: Glucose-Capillary: 90 mg/dL (ref 70–99)

## 2024-11-05 MED ORDER — SODIUM CHLORIDE 0.9 % IV SOLN
2.0000 g | Freq: Once | INTRAVENOUS | Status: AC
  Administered 2024-11-05: 2 g via INTRAVENOUS
  Filled 2024-11-05: qty 12.5

## 2024-11-05 MED ORDER — LINEZOLID 600 MG/300ML IV SOLN
600.0000 mg | Freq: Two times a day (BID) | INTRAVENOUS | Status: DC
  Administered 2024-11-06 (×2): 600 mg via INTRAVENOUS
  Filled 2024-11-05 (×3): qty 300

## 2024-11-05 MED ORDER — LEVOTHYROXINE SODIUM 75 MCG PO TABS
75.0000 ug | ORAL_TABLET | Freq: Every day | ORAL | Status: AC
  Administered 2024-11-06 – 2024-11-08 (×3): 75 ug via ORAL
  Filled 2024-11-05 (×3): qty 1

## 2024-11-05 MED ORDER — DIVALPROEX SODIUM 250 MG PO DR TAB
1500.0000 mg | DELAYED_RELEASE_TABLET | Freq: Once | ORAL | Status: AC
  Administered 2024-11-05: 1500 mg via ORAL
  Filled 2024-11-05: qty 6

## 2024-11-05 MED ORDER — VANCOMYCIN HCL IN DEXTROSE 1-5 GM/200ML-% IV SOLN: 1000.0000 mg | Freq: Once | INTRAVENOUS | Status: DC

## 2024-11-05 MED ORDER — DIVALPROEX SODIUM 250 MG PO DR TAB
750.0000 mg | DELAYED_RELEASE_TABLET | Freq: Two times a day (BID) | ORAL | Status: DC
  Administered 2024-11-06 (×2): 750 mg via ORAL
  Filled 2024-11-05 (×2): qty 3

## 2024-11-05 MED ORDER — AMLODIPINE BESYLATE 5 MG PO TABS
5.0000 mg | ORAL_TABLET | Freq: Every day | ORAL | Status: DC
  Administered 2024-11-05: 5 mg via ORAL
  Filled 2024-11-05 (×2): qty 1

## 2024-11-05 MED ORDER — LEVETIRACETAM 500 MG PO TABS
500.0000 mg | ORAL_TABLET | Freq: Two times a day (BID) | ORAL | Status: DC
  Administered 2024-11-05: 500 mg via ORAL

## 2024-11-05 MED ORDER — INSULIN ASPART 100 UNIT/ML IJ SOLN: 0.0000 [IU] | Freq: Three times a day (TID) | INTRAMUSCULAR | Status: AC

## 2024-11-05 MED ORDER — LACTATED RINGERS IV SOLN: INTRAVENOUS | Status: AC

## 2024-11-05 MED ORDER — VANCOMYCIN HCL 2000 MG/400ML IV SOLN
2000.0000 mg | Freq: Once | INTRAVENOUS | Status: AC
  Administered 2024-11-05: 2000 mg via INTRAVENOUS
  Filled 2024-11-05: qty 400

## 2024-11-05 MED ORDER — ATENOLOL 25 MG PO TABS
50.0000 mg | ORAL_TABLET | Freq: Every day | ORAL | Status: DC
  Administered 2024-11-05: 50 mg via ORAL
  Filled 2024-11-05 (×2): qty 2

## 2024-11-05 MED ORDER — DIVALPROEX SODIUM 250 MG PO DR TAB: 750.0000 mg | DELAYED_RELEASE_TABLET | Freq: Two times a day (BID) | ORAL | Status: DC

## 2024-11-05 MED ORDER — ALLOPURINOL 100 MG PO TABS
300.0000 mg | ORAL_TABLET | Freq: Every day | ORAL | Status: AC
  Administered 2024-11-06 – 2024-11-08 (×3): 300 mg via ORAL
  Filled 2024-11-05 (×3): qty 3

## 2024-11-05 MED ORDER — PIPERACILLIN-TAZOBACTAM 3.375 G IVPB
3.3750 g | Freq: Three times a day (TID) | INTRAVENOUS | Status: DC
  Administered 2024-11-06 – 2024-11-07 (×4): 3.375 g via INTRAVENOUS
  Filled 2024-11-05 (×6): qty 50

## 2024-11-05 MED ORDER — ACETAMINOPHEN 650 MG RE SUPP: 650.0000 mg | Freq: Four times a day (QID) | RECTAL | Status: AC | PRN

## 2024-11-05 MED ORDER — HEPARIN SODIUM (PORCINE) 5000 UNIT/ML IJ SOLN
5000.0000 [IU] | Freq: Three times a day (TID) | INTRAMUSCULAR | Status: AC
  Administered 2024-11-06 – 2024-11-08 (×9): 5000 [IU] via SUBCUTANEOUS
  Filled 2024-11-05 (×9): qty 1

## 2024-11-05 MED ORDER — ACETAMINOPHEN 325 MG PO TABS: 650.0000 mg | ORAL_TABLET | Freq: Four times a day (QID) | ORAL | Status: AC | PRN

## 2024-11-05 MED ORDER — LEVETIRACETAM 500 MG PO TABS
500.0000 mg | ORAL_TABLET | Freq: Two times a day (BID) | ORAL | Status: DC
  Filled 2024-11-05: qty 1

## 2024-11-05 MED ORDER — CITALOPRAM HYDROBROMIDE 10 MG PO TABS
10.0000 mg | ORAL_TABLET | Freq: Every morning | ORAL | Status: AC
  Administered 2024-11-06 – 2024-11-08 (×3): 10 mg via ORAL
  Filled 2024-11-05 (×3): qty 1

## 2024-11-05 MED ORDER — SODIUM CHLORIDE 0.9% FLUSH: 3.0000 mL | Freq: Once | INTRAVENOUS | Status: AC

## 2024-11-05 NOTE — H&P (Incomplete)
 " Date: 11/05/2024               Patient Name:  Sarah Bishop MRN: [id number]  DOB: 1941-09-08 Age / Sex: 84 y.o., female   PCP: [name], [name], MD         Medical Service: Internal Medicine Teaching Service         Attending Physician: Dr. Dayton Eastern      First Contact: [name], DO    Second Contact: Dr. Lonni Africa, DO         Pager Information: First Contact Pager: [phone]   Second Contact Pager: [phone]   SUBJECTIVE   Chief Complaint: expressive aphasia   History of Present Illness: [name] is a 84 y.o. female with PMH of T2DM, HTN, HLD, Epilepsy, obesity, gout, Hx AVM left colon - cauterized 2005, left sided diverticulosis, OA.    ***   ED Course: Hypothermic at 91.7 degrees, BP 118/57. HR 50 Labs significant for  - Electrolytes WNL - BUN 52 - Cr 1.52 (around baseline, last Cr 1.33) - no leukocytosis  - CBG 90 - Ethanol <15 - Lactic Acid 0.5 - TSH 3.230 - UA : 150 glucose, otherwise unremarkable  Imaging  - MRI brain: no acute intracranial abnormality  Received: bear hugger.  Consulted IMTS and neurology   Past Medical History PCP: [name], [name], MD  T2DM HTN HLD Epilepsy Obesity Gout Hx AVM left colon - cauterized 2005 left sided diverticulosis OA  Meds: Patient reported:  Allopurinol  300 mg tablet daily  Amlodipine  5 mg tablet daily  Atenolol  50 mg tablet daily  Atorvastatin  80 mg tablet every other day  Citalopram  10 mg tablet daily  Jardiance  25 mg tablet daily OR 10 mg? Furosemide 20 mg daily  Glipizide  5 mg ? Levetricetam 500 mg BID  Levothyroxine  75 mcg  Nystatin cream  Ozempic  1 mg   Active Medications[1]  Past Surgical History Past Surgical History:  Procedure Laterality Date   BREAST BIOPSY Left    BREAST BIOPSY Left    DILATION AND CURETTAGE OF UTERUS     DILITATION & CURRETTAGE/HYSTROSCOPY WITH VERSAPOINT RESECTION N/A 12/26/2012   Procedure: DILATATION & CURETTAGE/HYSTEROSCOPY WITH VERSAPOINT  RESECTION;  Surgeon: Percilla Burly, MD;  Location: [facility];  Service: Gynecology;  Laterality: N/A;   IR IMAGING GUIDED PORT INSERTION  06/09/2022   IR REMOVAL TUN ACCESS W/ PORT W/O FL MOD SED  12/29/2022   JOINT REPLACEMENT     MANDIBLE FRACTURE SURGERY     ROBOTIC ASSISTED BILATERAL SALPINGO OOPHERECTOMY Bilateral 05/10/2022   Procedure: XI ROBOTIC ASSISTED BILATERAL SALPINGO OOPHORECTOMY, TOTAL HYSTERECTOMY, CYSTOSCOPY;  Surgeon: [name], MD;  Location: [facility];  Service: Gynecology;  Laterality: Bilateral;   TOTAL KNEE ARTHROPLASTY Bilateral     Social:  Lives With: Occupation: Support: Level of Function: Substances: -Tobacco: *** -Alcohol: *** -Recreational Drug: ***  Family History:  Family History  Problem Relation Age of Onset   Heart disease Maternal Grandmother    Heart disease Maternal Grandfather    Stroke Maternal Grandfather    Colon cancer Maternal Grandfather        dx 60s   Breast cancer Other    Cancer Half-Brother        in spine   Ovarian cancer Neg Hx    Endometrial cancer Neg Hx    Pancreatic cancer Neg Hx    Prostate cancer Neg Hx      Allergies: Allergies as of 11/05/2024 - Review Complete 11/05/2024  Allergen Reaction Noted   Augmentin [amoxicillin-pot clavulanate] Other (See Comments) 07/12/2013   Iodinated contrast media Nausea And Vomiting and Other (See Comments) 12/20/2012    Review of Systems: A complete ROS was negative except as per HPI.   OBJECTIVE:   Physical Exam: Blood pressure (!) 127/55, pulse 67, temperature (!) 92.8 F (33.8 C), temperature source Rectal, resp. rate 12, SpO2 97%.  Physical Exam   Labs: CBC    Component Value Date/Time   WBC 8.1 11/05/2024 1300   RBC 3.92 11/05/2024 1300   HGB 12.9 11/05/2024 1314   HGB 13.3 03/21/2024 0911   HCT 38.0 11/05/2024 1314   HCT 39.6 03/21/2024 0911   PLT 124 (L) 11/05/2024 1300   PLT 215 03/21/2024 0911   MCV 95.2 11/05/2024 1300   MCV 91 03/21/2024 0911    MCH 32.1 11/05/2024 1300   MCHC 33.8 11/05/2024 1300   RDW 15.7 (H) 11/05/2024 1300   RDW 14.9 03/21/2024 0911   LYMPHSABS 1.6 11/05/2024 1300   LYMPHSABS 2.0 03/21/2024 0911   MONOABS 0.4 11/05/2024 1300   EOSABS 0.3 11/05/2024 1300   EOSABS 0.3 03/21/2024 0911   BASOSABS 0.1 11/05/2024 1300   BASOSABS 0.1 03/21/2024 0911     CMP     Component Value Date/Time   NA 137 11/05/2024 1314   NA 137 03/21/2024 0911   K 3.9 11/05/2024 1314   CL 102 11/05/2024 1314   CO2 26 11/05/2024 1300   GLUCOSE 93 11/05/2024 1314   BUN 52 (H) 11/05/2024 1314   BUN 40 (H) 03/21/2024 0911   CREATININE 1.70 (H) 11/05/2024 1314   CREATININE 1.47 (H) 10/14/2022 1004   CALCIUM  10.5 (H) 11/05/2024 1300   PROT 7.1 11/05/2024 1300   PROT 7.0 03/21/2024 0911   ALBUMIN 4.2 11/05/2024 1300   ALBUMIN 4.2 03/21/2024 0911   AST 39 11/05/2024 1300   AST 14 (L) 10/14/2022 1004   ALT 40 11/05/2024 1300   ALT 10 10/14/2022 1004   ALKPHOS 100 11/05/2024 1300   BILITOT 0.5 11/05/2024 1300   BILITOT 0.3 03/21/2024 0911   BILITOT 0.4 10/14/2022 1004   GFRNONAA 33 (L) 11/05/2024 1300   GFRNONAA 35 (L) 10/14/2022 1004   GFRAA 57 (L) 12/25/2012 0850    Imaging: MR BRAIN WO CONTRAST Result Date: 11/05/2024 EXAM: MRI BRAIN WITHOUT CONTRAST 11/05/2024 04:10:41 PM TECHNIQUE: Multiplanar multisequence MRI of the head/brain was performed without the administration of intravenous contrast. COMPARISON: MRI brain 10/25/2024 and CT head 10/24/2024. CLINICAL HISTORY: expressive aphasia that has since resolved, known seizure disorder recently restarted on Keppra . FINDINGS: BRAIN AND VENTRICLES: No acute infarct. No intracranial hemorrhage. No mass. No midline shift. No hydrocephalus. The sella is unremarkable. Normal flow voids. ORBITS: No significant abnormality. SINUSES AND MASTOIDS: No significant abnormality. BONES AND SOFT TISSUES: Normal marrow signal. No soft tissue abnormality. IMPRESSION: 1. No acute intracranial  abnormality. Electronically signed by: Prentice Spade MD 11/05/2024 04:34 PM EST RP Workstation: [id number]     EKG: personally reviewed my interpretation is***. Prior EKG***  ASSESSMENT & PLAN:   Assessment & Plan by Problem: Active Problems:   * No active hospital problems. *   KENSLEI HEARTY is a 84 y.o. person living with a history of *** who presented with *** and admitted for *** on hospital day 0  Encephalopathy  Expressive Aphasia  History of epilepsy   Patient has a history of seizures and had been off Keppra  for 1 year, admitted 1/22 to [facility] for seizure  and started back on keppra  then after EEG showed evidence of seizure disorder. Since the discharge from [facility], patient's family has noted decline: sleeping 20+ hours, not eating/drinking, missing meds.  This episode was described as an episode of aphasia that lasted 1 hour.  - Metabolic: TSH WNL, Glucose WNL, Electrolytes WNL.  - Infectious: No active signs of infection, hemodynamically stable, no leukocytosis, urinalysis unremarkable.  - Structural: MRI unremarkable.  - Trauma/Toxic: Imaging unremarkable, ethanol <15, medications *** (restarted Keppra )  Patient has returned to baseline per family? ***.   Hypothermia Patient presented with temperature 91.7, HR 50, NSR, BP 118/57. TSH WNL. MRI unremarkable. No signs of infection. Previous episode of hypothermia noted 1/24 ***  T2DM ***  Best practice: Diet: {NAMES:3044014::Normal,Heart Healthy,Carb-Modified,Renal,Carb/Renal,NPO,TPN,Tube Feeds} VTE: {NAMES:3044014::Heparin ,Enoxaparin ,SCDs,DOAC,None} IVF: {NAMES:3044014::None,NS,1/2 NS,LR,D5,D10},{NAMES:3044014::None,10cc/hr,25cc/hr,50cc/hr,75cc/hr,100cc/hr,110cc/hr,125cc/hr,Bolus} Code: {NAMES:3044014::Full,DNR,DNI,DNR/DNI,Comfort Care,Unknown}  Disposition planning: Prior to Admission Living Arrangement: {NAMES:3044014::Home, living ***,SNF,  ***,Homeless,***} Anticipated Discharge Location: {NAMES:3044014::Home,SNF,CIR,***}  Dispo: Admit patient to {STATUS:3044014::Observation with expected length of stay less than 2 midnights.,Inpatient with expected length of stay greater than 2 midnights.}  Signed: Benuel Braun, DO Internal Medicine Resident  11/05/2024, 5:49 PM  On Call pager: [phone]      [1]  No outpatient medications have been marked as taking for the 11/05/24 encounter [facility] Encounter).   "

## 2024-11-05 NOTE — ED Notes (Signed)
 Provider contacted about sliding scale and one time dose novo log. Potential for meds administration error. Advised by provider to hold sliding scale and only give one time dose of insulin .

## 2024-11-05 NOTE — ED Triage Notes (Addendum)
 Daughter stated, My mother was talking all crazy within the last 2 weeks. We talk to her neurologist and they said to come here. Pt alert and oriented to name, DOB, and president. Unable to recall today or the month.

## 2024-11-05 NOTE — Progress Notes (Signed)
 Pharmacy Antibiotic Note  Sarah Bishop is a 84 y.o. female admitted on 11/05/2024 with sepsis.  Pharmacy has been consulted for vancomycin  and piperacillin /tazobactam dosing. Discussed switching vancomycin  to linezolid  given CKD, MD agreed.  Plan: Linezolid  600mg  Q12hr piperacillin /tazobactam 3.375 q8hr  Monitor cultures/MRSA screen, clinical status, renal function  Narrow abx as able and f/u duration     Temp (24hrs), Avg:95.5 F (35.3 C), Min:91.7 F (33.2 C), Max:98.7 F (37.1 C)  Recent Labs  Lab 11/05/24 1300 11/05/24 1314 11/05/24 1519  WBC 8.1  --   --   CREATININE 1.52* 1.70*  --   LATICACIDVEN  --   --  0.5    Estimated Creatinine Clearance: 28.2 mL/min (A) (by C-G formula based on SCr of 1.7 mg/dL (H)).    Allergies[1]  Antimicrobials this admission: linez 2/3 >>  piptazo 2/3 >>   Dose adjustments this admission: N/a  Microbiology results: 2/3 BCx:   2/3 MRSA PCR:   Thank you for allowing pharmacy to be a part of this patients care.  Jinnie Door, PharmD, BCPS, BCCP Clinical Pharmacist  Please check AMION for all [facility] Pharmacy phone numbers After 10:00 PM, call Main Pharmacy [phone]     [1]  Allergies Allergen Reactions   Augmentin [Amoxicillin-Pot Clavulanate] Other (See Comments)    Unsure of reaction.   Iodinated Contrast Media Nausea And Vomiting and Other (See Comments)

## 2024-11-05 NOTE — Progress Notes (Signed)
LTM EEG hooked up and running - no initial skin breakdown. 

## 2024-11-05 NOTE — ED Provider Triage Note (Signed)
 Emergency Medicine Provider Triage Evaluation Note  Sarah Bishop , a 84 y.o. female  was evaluated in triage.  Pt complains of altered mental status.  Patient is accompanied by her daughter who provides collateral information, patient was recently admitted at [facility] 1/22 for several days and had a complete workup there where it was determined that she likely had suffered from a seizure, patient had history of grand mal seizures but was taken off of her antiepileptic medications 1 year ago as she had not had a seizure in 15 years.  After this recent hospitalization she was restarted on Keppra  and has been compliant with this medication.  Patient's daughter noted this morning around 9:45 AM she had garbled speech and was not making sense, she shows me a video of this and her mother was demonstrating expressive aphasia which has since resolved.  Patient's daughter has not witnessed any seizure activity.  Review of Systems  Positive: As above Negative: As above  Physical Exam  BP (!) 118/57   Pulse (!) 50   Resp 14   SpO2 100%  Gen:   Awake, no distress   Resp:  Normal effort  MSK:   Moves extremities without difficulty  Other:  Facial expressions are symmetric and intact without facial droop, negative pronator drift, normal cerebellar testing without ataxia including finger-to-nose, 5/5 strength against resistance of bilateral upper and lower extremities, normal sensation alert and oriented to self/location/month but has difficulty identifying the year, normal speech  Medical Decision Making  Medically screening exam initiated at 1:09 PM.  Appropriate orders placed.  Sarah Bishop was informed that the remainder of the evaluation will be completed by another provider, this initial triage assessment does not replace that evaluation, and the importance of remaining in the ED until their evaluation is complete.     Glendia Rocky SAILOR, [place] 11/05/24 1311

## 2024-11-05 NOTE — Progress Notes (Addendum)
 "  Virtual Visit via Video Note  This visit type was conducted with patient consent. This format is felt to be most appropriate for this patient at this time. Physical exam was limited by quality of the video and audio technology used for the visit.    Consent was obtained for video visit:  Yes.   Answered questions that patient had about telehealth interaction:  Yes.   Patient is aware of the limitations, risks, security and privacy concerns of performing an evaluation and management service by telemedicine. The patient expressed understanding and agreed to proceed.  Pt location: Home Physician Location: office Name of referring provider:  [name], [name], MD I connected with Sarah Bishop at patients initiation/request on 11/05/2024 at 10:30 AM EST by video enabled telemedicine application and verified that I am speaking with the correct person using two identifiers. Pt MRN:  [id number] Pt DOB:  04-30-1941 Video Participants:  Sarah Bishop;  Sarah Bishop (daughter)   History of Present Illness:  Discussed the use of AI scribe software for clinical note transcription with the patient, who gave verbal consent to proceed.  History of Present Illness The patient had a virtual video visit on 11/05/2024. She was last seen in the neurology clinic 7 months ago. She has a history of Primary Generalized Epilepsy and had been seizure-free since 1994 on low dose Depakote  750mg  daily. She had an EEG in 09/2023 and discontinued medication, off Depakote  for a year with no issues up until 10/24/2024. Her daughter Sarah provides the history, last seen normal on 10/22/24, Sarah Bishop did not see her on Monday, however on Tues 1/22, when she came down for dinner, Sarah Bishop noticed psychomotor slowing, she was looking off and did not know how to add up [place] which they play daily. She was brought to [facility], records have been requested for review, Sarah Bishop reports that she was hypothermic 52F on admission, she had an  EEG and MRI which looked like she had a seizure/determined she had a seizure. She was discharged on 1/24 on Levetiracetam  500mg  BID and was back to baseline over the weekend with her ambulation and speech. On 1/27, Sarah Bishop found her on the couch (where she does not usually sit) without pants on, not sure how she got there. There was a pile of clothes under the table soaked with urine. Sarah Bishop noticed she missed 2 doses of her medications (Monday night and Tuesday morning). She was like a sloth, needing help to the bathroom. Sarah Bishop notes a new change in the past 30 minutes with gibberish speech. She shows a video from this morning where she is making paraphasic errors with perseverating speech. Sarah Bishop can tell me her name, DOB, follow simple instructions and says she is fine, however she is noted to perseverate on several words with slowed speech.   She has been sleeping up to 20 hours per day over the past week. She had not had a bowel movement for seven days until yesterday, after which she was able to defecate. Urine appears normal, no foul-smelling odor noted. She uses a walker for ambulation and has required increased supervision, now staying with Sarah Bishop upstairs for the past week. She denies any headaches, dizziness, focal numbness/tingling/weakness. She was complaining her right knee bothered her and Sarah Bishop had to sit her down/get neighbor to help get her up. This is a change from the past week, when she was discharged home she was ambulating at baseline with walker.   History on Initial Assessment 09/15/2023: This  is a very pleasant 84 year old right-handed woman with a history of hypertension, hyperlipidemia, DM2, hypothyroidism, epilepsy, presenting for evaluation of need for continued seizure medication. Seizures started at age 20, initially she had not warning however later on she would feel her eyes start blinking and have a certain feeling. She would have staring that may progressive to a convulsion. She  denies any myoclonic jerks, olfactory/gustatory hallucinations, focal numbness/tingling/weakness. One time she was in a car and the light through the trees made her feel like she would have a seizure. Stress was also a trigger. No nocturnal seizures. She cannot recall if there was a catamenial component. She has been on Depakote  750mg  every morning for at least 40 years, seizure-free since 1994. Sarah Bishop denies any staring/unresponsive episodes. There was one instance when her dog passed away 2 years ago and she was acting weird, very emotional, unable to get her words out. No recurrence. She used to have migraines, no further headaches. No dizziness, diplopia, dysarthria/dysphagia. She has neck and back pain, urinary incontinence. She has neuropathy in both feet. No tremors. She usually gets 8-9 hours of sleep. Mood is very relaxes. She lives with [facility] and does not drive. She manages her own medications.  Epilepsy Risk Factors:  She had a normal birth and early development.  There is no history of febrile convulsions, CNS infections such as meningitis/encephalitis, significant traumatic brain injury, neurosurgical procedures, or family history of seizures.  Prior ASMs:Dilantin, Phenobarbital   Medications Ordered Prior to Encounter[1]   Observations/Objective:   Vitals:   11/05/24 1009  Weight: 227 lb (103 kg)  Height: 5' 3 (1.6 m)   GEN:  The patient appears stated age and is in NAD.  Neurological examination: Patient is awake, slow to respond, able to answer some questions such as her name and DOB, but perseverates on 84 as the year and her address. She is able to name pen but then perseverates on pen when showed other objects. Daughter shows video from earlier this morning where she has gibberish speech/paraphasic errors. Able to repeat phrase. Able to describe photo shown. She is able to show 2 fingers on left hand and thumb on right hand. Difficulty with clock drawing. Cranial nerves:  Extraocular movements intact. No facial asymmetry. Motor: moves all extremities symmetrically, at least anti-gravity x 4. Gait slow and cautious with walker, no clear ataxia noted.    Assessment and Plan:   This is a very pleasant 84 yo RH woman with a history of hypertension, hyperlipidemia, DM2, hypothyroidism, who had well-controlled epilepsy seizure-free since 1994. EEG in 09/2023 was normal, Depakote  was discontinued a year ago. She was admitted to [facility] on 10/24/24 and determined to have a seizure, records requested for review, started on Levetiracetam  500mg  BID. She was discharged home back to baseline on 1/24, however has had a change in mental status again since 1/27, since this morning she has been having speech changes with paraphasic errors and perseveration, psychomotor slowing. I discussed with daughter that Keppra  in itself should not cause these changes, recommend ER for repeat MRI and further evaluation with another EEG. She had been doing very well previously on Depakote , would opt to switch back to Depakote  750mg  daily (she was on 250mg  3 tabs daily) for over 30 years without issues. Plan of care discussed with daughter who expressed understanding and agreement.   Follow Up Instructions:   -I discussed the assessment and treatment plan with the patient. The patient was provided an opportunity  to ask questions and all were answered. The patient agreed with the plan and demonstrated an understanding of the instructions.   The patient was advised to call back or seek an in-person evaluation if the symptoms worsen or if the condition fails to improve as anticipated.   ADDENDUM: EEG report from [facility]: EEG shown sharp waves in the right and left temporal, left parietal and left occipital areas indicating discharge focus consistent with seizure disorder.    Darice CHRISTELLA Shivers, MD     [1]  Current Outpatient Medications on File Prior to Visit  Medication Sig  Dispense Refill   acetaminophen  (TYLENOL ) 500 MG tablet Take 500-1,000 mg by mouth every 6 (six) hours as needed (pain.).     allopurinol  (ZYLOPRIM ) 300 MG tablet TAKE 1 TABLET BY MOUTH DAILY 100 tablet 2   amLODipine  (NORVASC ) 5 MG tablet TAKE 1 TABLET BY MOUTH AT  BEDTIME 100 tablet 2   atenolol  (TENORMIN ) 50 MG tablet TAKE 2 TABLETS BY MOUTH AT  BEDTIME (Patient taking differently: Take 50 mg by mouth at bedtime. PT IS ONLY TAKEN 50 MG A DAY) 200 tablet 2   atorvastatin  (LIPITOR) 80 MG tablet TAKE 1 TABLET BY MOUTH EVERY  OTHER DAY AT NIGHT 50 tablet 2   Blood Glucose Monitoring Suppl (ACCU-CHEK GUIDE) w/Device KIT 1 each by Does not apply route as directed. 1 kit 0   cholecalciferol (VITAMIN D3) 25 MCG (1000 UNIT) tablet Take 2 tablets (2,000 Units total) by mouth at bedtime. 60 tablet 1   citalopram  (CELEXA ) 10 MG tablet TAKE 1 TABLET BY MOUTH IN THE  MORNING 90 tablet 3   empagliflozin  (JARDIANCE ) 25 MG TABS tablet Take 1 tablet (25 mg total) by mouth daily before breakfast. 90 tablet 1   furosemide (LASIX) 20 MG tablet Take 40 mg by mouth in the morning.     glucose blood test strip Use as instructed 100 each 12   Lancets (ONETOUCH DELICA PLUS LANCET33G) MISC Use test strip up to four times a day. 400 each 2   Lancets MISC Use up to four times daily as directed. (FOR ICD-10 E10.9, E11.9). 100 each 0   levETIRAcetam  (KEPPRA ) 500 MG tablet Take 500 mg by mouth 2 (two) times daily.     levothyroxine  (SYNTHROID ) 75 MCG tablet TAKE 1 TABLET BY MOUTH DAILY  BEFORE BREAKFAST 100 tablet 2   Semaglutide , 1 MG/DOSE, 4 MG/3ML SOPN Inject 1 mg as directed once a week. 9 mL 1   JARDIANCE  10 MG TABS tablet TAKE 1 TABLET BY MOUTH DAILY  BEFORE BREAKFAST 100 tablet 2   No current facility-administered medications on file prior to visit.   "

## 2024-11-06 ENCOUNTER — Other Ambulatory Visit: Payer: Self-pay

## 2024-11-06 ENCOUNTER — Inpatient Hospital Stay (HOSPITAL_COMMUNITY)

## 2024-11-06 DIAGNOSIS — R569 Unspecified convulsions: Secondary | ICD-10-CM

## 2024-11-06 DIAGNOSIS — G9340 Encephalopathy, unspecified: Secondary | ICD-10-CM | POA: Diagnosis not present

## 2024-11-06 DIAGNOSIS — T426X5A Adverse effect of other antiepileptic and sedative-hypnotic drugs, initial encounter: Secondary | ICD-10-CM | POA: Diagnosis not present

## 2024-11-06 DIAGNOSIS — G40409 Other generalized epilepsy and epileptic syndromes, not intractable, without status epilepticus: Secondary | ICD-10-CM

## 2024-11-06 LAB — CBC
HCT: 30.7 % — ABNORMAL LOW (ref 36.0–46.0)
Hemoglobin: 10.2 g/dL — ABNORMAL LOW (ref 12.0–15.0)
MCH: 31.7 pg (ref 26.0–34.0)
MCHC: 33.2 g/dL (ref 30.0–36.0)
MCV: 95.3 fL (ref 80.0–100.0)
Platelets: 101 10*3/uL — ABNORMAL LOW (ref 150–400)
RBC: 3.22 MIL/uL — ABNORMAL LOW (ref 3.87–5.11)
RDW: 15.6 % — ABNORMAL HIGH (ref 11.5–15.5)
WBC: 6.4 10*3/uL (ref 4.0–10.5)
nRBC: 0 % (ref 0.0–0.2)

## 2024-11-06 LAB — COMPREHENSIVE METABOLIC PANEL WITH GFR
ALT: 30 U/L (ref 0–44)
AST: 30 U/L (ref 15–41)
Albumin: 3.3 g/dL — ABNORMAL LOW (ref 3.5–5.0)
Alkaline Phosphatase: 75 U/L (ref 38–126)
Anion gap: 10 (ref 5–15)
BUN: 41 mg/dL — ABNORMAL HIGH (ref 8–23)
CO2: 24 mmol/L (ref 22–32)
Calcium: 9.6 mg/dL (ref 8.9–10.3)
Chloride: 104 mmol/L (ref 98–111)
Creatinine, Ser: 1.37 mg/dL — ABNORMAL HIGH (ref 0.44–1.00)
GFR, Estimated: 38 mL/min — ABNORMAL LOW
Glucose, Bld: 93 mg/dL (ref 70–99)
Potassium: 4.2 mmol/L (ref 3.5–5.1)
Sodium: 138 mmol/L (ref 135–145)
Total Bilirubin: 0.4 mg/dL (ref 0.0–1.2)
Total Protein: 5.5 g/dL — ABNORMAL LOW (ref 6.5–8.1)

## 2024-11-06 LAB — GLUCOSE, CAPILLARY
Glucose-Capillary: 73 mg/dL (ref 70–99)
Glucose-Capillary: 117 mg/dL — ABNORMAL HIGH (ref 70–99)
Glucose-Capillary: 94 mg/dL (ref 70–99)
Glucose-Capillary: 81 mg/dL (ref 70–99)

## 2024-11-06 LAB — CORTISOL: Cortisol, Plasma: 8.9 ug/dL

## 2024-11-06 LAB — MRSA NEXT GEN BY PCR, NASAL: MRSA by PCR Next Gen: NOT DETECTED

## 2024-11-06 LAB — AMMONIA: Ammonia: 56 umol/L — ABNORMAL HIGH (ref 9–35)

## 2024-11-06 MED ORDER — LEVETIRACETAM 500 MG PO TABS
500.0000 mg | ORAL_TABLET | Freq: Once | ORAL | Status: AC
  Administered 2024-11-06: 500 mg via ORAL
  Filled 2024-11-06: qty 1

## 2024-11-06 NOTE — Progress Notes (Signed)
 Patient's body temp 94 degree rectally. Notified MD. Placed warm blankets on patient and cut the tempature up in the room.

## 2024-11-06 NOTE — Progress Notes (Signed)
 LTM VIDEO EEG discontinued - no skin breakdown at [facility].

## 2024-11-06 NOTE — Procedures (Signed)
 Patient Name: MARKIA KYER  MRN: [id number]  Epilepsy Attending: Arlin MALVA Krebs  Referring Physician/Provider: Michaela Aisha SQUIBB, MD  Duration: 11/05/2024 1851 to 11/06/2024 0945  Patient history:  84yo F presented for evaluation of garbled speech and repetition of words and just not acting right. EEG to evaluate for seizure   Level of alertness: Awake, asleep  AEDs during EEG study: LEV, VPA  Technical aspects: This EEG study was done with scalp electrodes positioned according to the 10-20 International system of electrode placement. Electrical activity was reviewed with band pass filter of 1-70Hz , sensitivity of 7 uV/mm, display speed of 12mm/sec with a 60Hz  notched filter applied as appropriate. EEG data were recorded continuously and digitally stored.  Video monitoring was available and reviewed as appropriate.  Description: The posterior dominant rhythm consists of 8-9 Hz activity of moderate voltage (25-35 uV) seen predominantly in posterior head regions, symmetric and reactive to eye opening and eye closing.  Sleep was characterized by vertex waves, sleep spindles (12 to 14 Hz), maximal frontocentral region. Frequent generalized and maximal posterior quadrant polyspikes were noted which gradually improved. Hyperventilation and photic stimulation were not performed.     ABNORMALITY -Polyspikes, generalized  IMPRESSION: This study is consistent with patient's known history of primary generalized epilepsy. No seizures were seen throughout the recording.  Jane Birkel O Troy Kanouse

## 2024-11-06 NOTE — TOC Initial Note (Signed)
 Transition of Care [facility]) - Initial/Assessment Note    Patient Details  Name: Sarah Bishop MRN: [id number] Date of Birth: 1940-11-09  Transition of Care [facility]) CM/SW Contact:    Andrez JULIANNA George, RN Phone Number: 11/06/2024, 1:42 PM  Clinical Narrative:                 Sarah Bishop is a 84 y.o. female with history of seizures, diabetes mellitus type 2, sleep apnea, hypertension, chronic kidney disease stage III, hypothyroidism was brought to the ER after patient was found to be increasingly lethargic speaking gibberish and confused.   CM met with patients son. She is from home with her daughter. Daughter lives upstairs and pt downstairs. She has a stair lift to assist her up to daughters living space. Daughter works from home. Pt was independent with meds, ADL's up until about 1.5-2 weeks ago. Since then daughter has been managing her meds and assisting with ADL's. Daughter provides needed transportation.  Currently on LTEEG. Pt will need therapy evals after EEG as son feels pt has had a decline in last 2 weeks.  ICM following.  Expected Discharge Plan:  (TBD) Barriers to Discharge: Continued Medical Work up   Patient Goals and CMS Choice   CMS Medicare.gov Compare Post Acute Care list provided to:: Patient Choice offered to / list presented to : Patient, Adult Children      Expected Discharge Plan and Services   Discharge Planning Services: CM Consult   Living arrangements for the past 2 months: Single Family Home                                      Prior Living Arrangements/Services Living arrangements for the past 2 months: Single Family Home Lives with:: Adult Children              Current home services: DME (walker/ cane/ stair lift) Criminal Activity/Legal Involvement Pertinent to Current Situation/Hospitalization: No - Comment as needed  Activities of Daily Living   ADL Screening (condition at time of admission) Independently performs ADLs?: Yes  (appropriate for developmental age) Is the patient deaf or have difficulty hearing?: No Does the patient have difficulty seeing, even when wearing glasses/contacts?: No Does the patient have difficulty concentrating, remembering, or making decisions?: No  Permission Sought/Granted                  Emotional Assessment           Psych Involvement: No (comment)  Admission diagnosis:  Aphasia [R47.01] Status epilepticus (HCC) [G40.901] Confusion [R41.0] Hypothermia, initial encounter [T68.XXXA] Subclinical status epilepticus (HCC) [G40.901] Patient Active Problem List   Diagnosis Date Noted   Seizure (HCC) 11/06/2024   Hypercalcemia 11/06/2024   Status epilepticus (HCC) 11/05/2024   Hypothermia 11/05/2024   Primary osteoarthritis involving multiple joints 06/24/2024   Estrogen deficiency 06/24/2024   Class 2 severe obesity due to excess calories with serious comorbidity and body mass index (BMI) of 38.0 to 38.9 in adult 06/24/2024   Lesion of ulnar nerve 12/18/2023   Chronic right shoulder pain 12/18/2023   Lymphedema of both lower extremities 09/12/2023   Controlled type 2 diabetes mellitus with complication, without long-term current use of insulin  (HCC) 07/11/2023   Anemia, chronic disease 06/15/2023   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/2023   Pulsatile tinnitus of left ear 06/06/2023   Sensorineural hearing loss (SNHL) of both ears 06/06/2023  Encounter for immunization 09/07/2022   OSA on CPAP 07/04/2022   Pancytopenia, acquired (HCC) 07/02/2022   CKD (chronic kidney disease), stage III (HCC) 05/30/2022   Fallopian tube cancer, carcinoma, left (HCC) 05/17/2022   Primary osteoarthritis 03/09/2022   Primary gout 03/09/2022   Loud snoring 03/09/2022   Hypertension 03/09/2022   History of epilepsy 03/09/2022   Heart murmur 03/09/2022   Bilateral primary osteoarthritis of knee 02/01/2022   Recurrent major depressive episodes, mild 07/26/2020   Asthenia 05/21/2020   Secondary  immune deficiency disorder 05/21/2020   Hyperparathyroidism 06/13/2019   Vitamin D  deficiency 12/31/2018   Hyperlipidemia 11/26/2018   Stasis dermatitis 11/26/2018   Hypothyroidism 11/26/2018   PCP:  [name], [name], MD Pharmacy:   [id number] - [place], [place] - [address] [facility] [place] [address] Phone: [phone] Fax: [phone]  [facility] - [place], [place] - [address] [facility] [address] [place] [address] [place] [address] Phone: [phone] Fax: [phone]     Social Drivers of Health (SDOH) Social History: SDOH Screenings   Food Insecurity: No Food Insecurity (11/06/2024)  Housing: Low Risk (11/06/2024)  Transportation Needs: No Transportation Needs (11/06/2024)  Utilities: Not At Risk (11/06/2024)  Alcohol Screen: Low Risk (09/04/2023)  Depression (PHQ2-9): Low Risk (09/05/2024)  Financial Resource Strain: Low Risk (09/04/2023)  Physical Activity: Patient Declined (09/05/2024)  Social Connections: Socially Isolated (11/06/2024)  Stress: No Stress Concern Present (09/05/2024)  Tobacco Use: Medium Risk (11/05/2024)  Health Literacy: Adequate Health Literacy (09/05/2024)   SDOH Interventions:     Readmission Risk Interventions     No data to display

## 2024-11-06 NOTE — Progress Notes (Signed)
 LTM maint complete - no skin breakdown under: A1, C3, Cz

## 2024-11-06 NOTE — Plan of Care (Signed)

## 2024-11-06 NOTE — Progress Notes (Signed)
 " PROGRESS NOTE  Sarah Bishop  FMW:[id number] DOB: 10-Aug-1941 DOA: 11/05/2024 PCP: [name], [name], MD   Brief Narrative: Patient is a 84 year old female with history of seizure, diabetes type 2, sleep apnea, hypertension, CKD stage IIIb, hypothyroidism who was brought to the emergency department after she was found to be lethargic, confused.  She was recently started on Keppra  for seizure-like activity.  On prednisone, she was hemodynamically stable.  MRI did not show any acute findings.  Patient was also hypothermic.  Placed on [employer]  Neurology consulted.  Admitted for the management subclinical  seizure.  Currently on continuous EEG monitoring  Assessment & Plan:  Principal Problem:   Seizure (HCC) Active Problems:   Hypertension   History of epilepsy   Fallopian tube cancer, carcinoma, left (HCC)   CKD (chronic kidney disease), stage III (HCC)   OSA on CPAP   Controlled type 2 diabetes mellitus with complication, without long-term current use of insulin  (HCC)   Lymphedema of both lower extremities   Hyperlipidemia   Hypothyroidism   Status epilepticus (HCC)   Hypothermia   Hypercalcemia   Subclinical seizures/acute metabolic encephalopathy: She was taking Depakote  in the past.  It was stopped last year.  Patient had presented to [facility] with seizure-like activity and was started on Keppra .  Presented with confusion, lethargy.  Concern for subclinical seizures.  Neurology consulted and following.  Restarted on Depakote .  Currently on continuous EEG. UA not suggestive of UTI.  TSH normal.  Checking ammonia, cortisol level as well.  Hypothermia: Likely exposure to cold environment.  Cortisol level pending.  Antibiotics started for the suspicion of sepsis.  Follow-up cultures.  Low threshold to discontinue antibiotics.  Currently on Zosyn .  History of hypertension: Monitor blood pressure.  Currently on amlodipine , metoprolol  Chronic bilateral lower extremity edema:  Takes Lasix at home.  Currently on hold.  Diabetes type 2: On Jardiance , semaglutide .  Currently on sliding scale.  A1c of 6.4 as per 06/24/2024  Hypothyroidism: Synthyroid  History of sleep apnea: On CPAP at home  Thrombocytopenia: Continue to monitor.  CKD stage IIIb: Baseline creatinine around 1.6.  Currently kidney function at baseline.  History of gout: On allopurinol   History of depression: Citalopram   History of fallopian tube cancer: Currently in remission.  Debility/deconditioning: Will consult PT/OT when appropriate.  She ambulates  with the help of walker and lives with her daughter.       DVT prophylaxis:heparin  injection 5,000 Units Start: 11/06/24 0600     Code Status: Limited: Do not attempt resuscitation (DNR) -DNR-LIMITED -Do Not Intubate/DNI   Family Communication: Discussed with son at bedside on 2/4  Patient status:Inpatient  Patient is from :home  Anticipated discharge to:not sure  Estimated DC date:not sure   Consultants: Neurology  Procedures: Continuous EEG  Antimicrobials:  Anti-infectives (From admission, onward)    Start     Dose/Rate Route Frequency Ordered Stop   11/06/24 1000  linezolid  (ZYVOX ) IVPB 600 mg        600 mg 300 mL/hr over 60 Minutes Intravenous Every 12 hours 11/05/24 2238     11/06/24 0600  piperacillin -tazobactam (ZOSYN ) IVPB 3.375 g        3.375 g 12.5 mL/hr over 240 Minutes Intravenous Every 8 hours 11/05/24 2238     11/05/24 1930  ceFEPIme  (MAXIPIME ) 2 g in sodium chloride  0.9 % 100 mL IVPB        2 g 200 mL/hr over 30 Minutes Intravenous  Once 11/05/24 1921  11/05/24 2108   11/05/24 1930  vancomycin  (VANCOCIN ) IVPB 1000 mg/200 mL premix  Status:  Discontinued        1,000 mg 200 mL/hr over 60 Minutes Intravenous  Once 11/05/24 1921 11/05/24 1926   11/05/24 1930  vancomycin  (VANCOREADY) IVPB 2000 mg/400 mL        2,000 mg 200 mL/hr over 120 Minutes Intravenous  Once 11/05/24 1926 11/05/24 2306        Subjective: Patient seen and examined at bedside today.  Hemodynamically stable.  Comfortably lying on bed.  On continuous EEG.  This morning she is alert and oriented.  Denies any new complaints.  She is still hypothermic and was on warm blankets.  Son at bedside.  Denies any abdominal pain, dysuria, shortness of breath or cough.  Objective: Vitals:   11/05/24 2115 11/05/24 2203 11/05/24 2302 11/06/24 0500  BP: (!) 117/50 (!) 114/48 (!) 108/53 (!) 103/51  Pulse: 90 88 89 71  Resp: 17 18 18 18   Temp:  98.7 F (37.1 C) 98.5 F (36.9 C) (!) 97.2 F (36.2 C)  TempSrc:  Oral Oral Oral  SpO2: 93% 96% 95% 95%    Intake/Output Summary (Last 24 hours) at 11/06/2024 0751 Last data filed at 11/05/2024 2108 Gross per 24 hour  Intake 98.87 ml  Output --  Net 98.87 ml   There were no vitals filed for this visit.  Examination:  General exam: Overall comfortable, not in distress, lying in bed HEENT: PERRL, EEG leads on the scalp Respiratory system:  no wheezes or crackles  Cardiovascular system: S1 & S2 heard, RRR.  Gastrointestinal system: Abdomen is nondistended, soft and nontender. Central nervous system: Alert and oriented Extremities: No edema, no clubbing ,no cyanosis Skin: No rashes, no ulcers,no icterus     Data Reviewed: I have personally reviewed following labs and imaging studies  CBC: Recent Labs  Lab 11/05/24 1300 11/05/24 1314  WBC 8.1  --   NEUTROABS 5.5  --   HGB 12.6 12.9  HCT 37.3 38.0  MCV 95.2  --   PLT 124*  --    Basic Metabolic Panel: Recent Labs  Lab 11/05/24 1300 11/05/24 1314  NA 138 137  K 4.1 3.9  CL 101 102  CO2 26  --   GLUCOSE 99 93  BUN 53* 52*  CREATININE 1.52* 1.70*  CALCIUM  10.5*  --      Recent Results (from the past 240 hours)  Blood culture (routine x 2)     Status: None (Preliminary result)   Collection Time: 11/05/24  8:27 PM   Specimen: BLOOD LEFT ARM  Result Value Ref Range Status   Specimen Description BLOOD LEFT  ARM  Final   Special Requests   Final    BOTTLES DRAWN AEROBIC AND ANAEROBIC Blood Culture adequate volume   Culture   Final    NO GROWTH < 12 HOURS Performed at [facility], [address] [place], [place] [address]    Report Status PENDING  Incomplete  Blood culture (routine x 2)     Status: None (Preliminary result)   Collection Time: 11/05/24  8:27 PM   Specimen: BLOOD  Result Value Ref Range Status   Specimen Description BLOOD SITE NOT SPECIFIED  Final   Special Requests   Final    BOTTLES DRAWN AEROBIC AND ANAEROBIC Blood Culture adequate volume   Culture   Final    NO GROWTH < 12 HOURS Performed at [facility],  [address], [place], [place] [address]    Report Status PENDING  Incomplete  MRSA Next Gen by PCR, Nasal     Status: None   Collection Time: 11/06/24  5:15 AM   Specimen: Nasal Mucosa; Nasal Swab  Result Value Ref Range Status   MRSA by PCR Next Gen NOT DETECTED NOT DETECTED Final    Comment: (NOTE) The GeneXpert MRSA Assay (FDA approved for NASAL specimens only), is one component of a comprehensive MRSA colonization surveillance program. It is not intended to diagnose MRSA infection nor to guide or monitor treatment for MRSA infections. Test performance is not FDA approved in patients less than 38 years old. Performed at [facility], [address] [place], [place] [address]      Radiology Studies: MR BRAIN WO CONTRAST Result Date: 11/05/2024 EXAM: MRI BRAIN WITHOUT CONTRAST 11/05/2024 04:10:41 PM TECHNIQUE: Multiplanar multisequence MRI of the head/brain was performed without the administration of intravenous contrast. COMPARISON: MRI brain 10/25/2024 and CT head 10/24/2024. CLINICAL HISTORY: expressive aphasia that has since resolved, known seizure disorder recently restarted on Keppra . FINDINGS: BRAIN AND VENTRICLES: No acute infarct. No intracranial hemorrhage. No mass. No midline shift. No hydrocephalus. The sella is unremarkable.  Normal flow voids. ORBITS: No significant abnormality. SINUSES AND MASTOIDS: No significant abnormality. BONES AND SOFT TISSUES: Normal marrow signal. No soft tissue abnormality. IMPRESSION: 1. No acute intracranial abnormality. Electronically signed by: Prentice Spade MD 11/05/2024 04:34 PM EST RP Workstation: [id number]    Scheduled Meds:  allopurinol   300 mg Oral Daily   amLODipine   5 mg Oral QHS   atenolol   50 mg Oral QHS   citalopram   10 mg Oral q morning   divalproex   750 mg Oral Q12H   heparin   5,000 Units Subcutaneous Q8H   insulin  aspart  0-6 Units Subcutaneous TID WC   levETIRAcetam   500 mg Oral Once   levothyroxine   75 mcg Oral QAC breakfast   sodium chloride  flush  3 mL Intravenous Once   Continuous Infusions:  lactated ringers  150 mL/hr at 11/05/24 1958   linezolid  (ZYVOX ) IV     piperacillin -tazobactam (ZOSYN )  IV 3.375 g (11/06/24 0542)     LOS: 1 day   Ivonne Mustache, MD [facility] P2/01/2025, 7:51 AM  "

## 2024-11-06 NOTE — Progress Notes (Signed)
 Subjective: No acute events overnight.  Per son at bedside, she is little drowsy but able to wake up and answer all questions appropriately.  States she has had some episodes of hypothermia.  Denies any other concerns.  ROS: negative except above  Examination  Vital signs in last 24 hours: Temp:  [92.8 F (33.8 C)-98.7 F (37.1 C)] 97.6 F (36.4 C) (02/04 1212) Pulse Rate:  [67-90] 70 (02/04 1212) Resp:  [12-18] 14 (02/04 1212) BP: (103-127)/(45-55) 110/49 (02/04 1212) SpO2:  [93 %-99 %] 96 % (02/04 1212) Weight:  [103 kg] 103 kg (02/04 1142)  General: lying in bed, NAD Neuro: MS: Alert, oriented, follows commands CN: pupils equal and reactive,  EOMI, face symmetric, tongue midline, normal sensation over face, Motor: 4/5 strength in all 4 extremities Coordination: normal Gait: not tested  Basic Metabolic Panel: Recent Labs  Lab 11/05/24 1300 11/05/24 1314 11/06/24 0811  NA 138 137 138  K 4.1 3.9 4.2  CL 101 102 104  CO2 26  --  24  GLUCOSE 99 93 93  BUN 53* 52* 41*  CREATININE 1.52* 1.70* 1.37*  CALCIUM  10.5*  --  9.6    CBC: Recent Labs  Lab 11/05/24 1300 11/05/24 1314 11/06/24 0811  WBC 8.1  --  6.4  NEUTROABS 5.5  --   --   HGB 12.6 12.9 10.2*  HCT 37.3 38.0 30.7*  MCV 95.2  --  95.3  PLT 124*  --  101*     Coagulation Studies: Recent Labs    11/05/24 1309  LABPROT 14.1  INR 1.0    Imaging personally reviewed  MRI brain without contrast 11/05/2024: No acute abnormality   ASSESSMENT AND PLAN: 65 old female with history of primary generalized epilepsy who remains seizure-free for almost 30 years and therefore was weaned off of Depakote .  Unfortunately had seizure recurrence in end of January and was started on Keppra .  Since then patient has had some episodes of confusion.  EEG showed recurrence of epileptiform discharges.  Was switched to Depakote  after which epileptiform discharges improved.  Epilepsy Acute encephalopathy - Likely due to side  effect of Keppra  - Continue Depakote  750 mg twice daily for now - Will check Depakote  and ammonia levels tomorrow.  If remains excessively drowsy, may need to reduce dose  - Will DC LTM EEG - Management of hypothermia per primary team - Discussed plan with family at bedside - Continue seizure precautions    I personally spent a total of 36 minutes in the care of the patient today including getting/reviewing separately obtained history, performing a medically appropriate exam/evaluation, counseling and educating, placing orders, referring and communicating with other health care professionals, documenting clinical information in the EHR, independently interpreting results, and coordinating care.           Arlin Krebs Epilepsy [facility] For questions after 5pm please refer to AMION to reach the Neurologist on call

## 2024-11-07 DIAGNOSIS — E7220 Disorder of urea cycle metabolism, unspecified: Secondary | ICD-10-CM | POA: Diagnosis not present

## 2024-11-07 DIAGNOSIS — G40409 Other generalized epilepsy and epileptic syndromes, not intractable, without status epilepticus: Secondary | ICD-10-CM | POA: Diagnosis not present

## 2024-11-07 DIAGNOSIS — T426X5A Adverse effect of other antiepileptic and sedative-hypnotic drugs, initial encounter: Secondary | ICD-10-CM | POA: Diagnosis not present

## 2024-11-07 LAB — VALPROIC ACID LEVEL: Valproic Acid Lvl: 40 ug/mL — ABNORMAL LOW (ref 50–100)

## 2024-11-07 LAB — BASIC METABOLIC PANEL WITH GFR
Anion gap: 9 (ref 5–15)
BUN: 33 mg/dL — ABNORMAL HIGH (ref 8–23)
CO2: 26 mmol/L (ref 22–32)
Calcium: 9.6 mg/dL (ref 8.9–10.3)
Chloride: 106 mmol/L (ref 98–111)
Creatinine, Ser: 1.67 mg/dL — ABNORMAL HIGH (ref 0.44–1.00)
GFR, Estimated: 30 mL/min — ABNORMAL LOW
Glucose, Bld: 112 mg/dL — ABNORMAL HIGH (ref 70–99)
Potassium: 4.2 mmol/L (ref 3.5–5.1)
Sodium: 141 mmol/L (ref 135–145)

## 2024-11-07 LAB — GLUCOSE, CAPILLARY
Glucose-Capillary: 91 mg/dL (ref 70–99)
Glucose-Capillary: 116 mg/dL — ABNORMAL HIGH (ref 70–99)
Glucose-Capillary: 111 mg/dL — ABNORMAL HIGH (ref 70–99)
Glucose-Capillary: 131 mg/dL — ABNORMAL HIGH (ref 70–99)

## 2024-11-07 LAB — AMMONIA: Ammonia: 83 umol/L — ABNORMAL HIGH (ref 9–35)

## 2024-11-07 MED ORDER — DIVALPROEX SODIUM 250 MG PO DR TAB
750.0000 mg | DELAYED_RELEASE_TABLET | Freq: Every day | ORAL | Status: AC
  Administered 2024-11-07 – 2024-11-08 (×2): 750 mg via ORAL
  Filled 2024-11-07 (×2): qty 3

## 2024-11-07 MED ORDER — ATENOLOL 25 MG PO TABS
12.5000 mg | ORAL_TABLET | Freq: Every day | ORAL | Status: AC
  Administered 2024-11-07 – 2024-11-08 (×2): 12.5 mg via ORAL
  Filled 2024-11-07 (×2): qty 1

## 2024-11-07 MED ORDER — LACTULOSE 10 GM/15ML PO SOLN
10.0000 g | Freq: Three times a day (TID) | ORAL | Status: AC
  Administered 2024-11-07 – 2024-11-08 (×4): 10 g via ORAL
  Filled 2024-11-07 (×5): qty 30

## 2024-11-07 MED ORDER — DIVALPROEX SODIUM 250 MG PO DR TAB: 750.0000 mg | DELAYED_RELEASE_TABLET | Freq: Every day | ORAL | Status: DC

## 2024-11-07 NOTE — Progress Notes (Signed)
 " PROGRESS NOTE  Sarah Bishop  FMW:[id number] DOB: 1940-11-16 DOA: 11/05/2024 PCP: [name], [name], MD   Brief Narrative: Patient is a 84 year old female with history of seizure, diabetes type 2, sleep apnea, hypertension, CKD stage IIIb, hypothyroidism who was brought to the emergency department after she was found to be lethargic, confused.  She was recently started on Keppra  for seizure-like activity.  On presentation, she was hemodynamically stable.  MRI did not show any acute findings.  Patient was also hypothermic.  Placed on [employer]  Neurology consulted.  Admitted for the management subclinical  seizure.  She was on cEEG monitoring, now discontinued.  Mentation has improved.  PT consulted.  Epilepsy team following.  Assessment & Plan:  Principal Problem:   Seizure (HCC) Active Problems:   Hypertension   History of epilepsy   Fallopian tube cancer, carcinoma, left (HCC)   CKD (chronic kidney disease), stage III (HCC)   OSA on CPAP   Controlled type 2 diabetes mellitus with complication, without long-term current use of insulin  (HCC)   Lymphedema of both lower extremities   Hyperlipidemia   Hypothyroidism   Status epilepticus (HCC)   Hypothermia   Hypercalcemia   Subclinical seizures/acute metabolic encephalopathy: She was taking Depakote  in the past.  It was stopped last year.  Patient had presented to [facility] with seizure-like activity and was started on Keppra .  Presented with confusion, lethargy.  Concern for subclinical seizures.  Neurology consulted and following.  Restarted on Depakote .  Status post continuous EEG. UA not suggestive of UTI.  TSH normal, cortisol level normal as well. Elevated ammonia is likely from Depakote .  Started on low-dose lactulose .  Dose adjustment as per epilepsy team  Hypothermia: Likely exposure to cold environment.  Cortisol level, TSH normal.  Antibiotics were  started for the suspicion of sepsis.  Follow-up cultures:NGTD.   Antibiotics discontinued  History of hypertension: Monitor blood pressure.  On amlodipine , atenolol  at home.  Currently on low dose atenolol  only because of soft blood pressure   Chronic bilateral lower extremity edema: Takes Lasix at home.  Currently on hold.  Diabetes type 2: On Jardiance , semaglutide .  Currently on sliding scale.  A1c of 6.4 as per 06/24/2024  Hypothyroidism: Synthyroid  History of sleep apnea: On CPAP at home  Thrombocytopenia: Continue to monitor.  CKD stage IIIb: Baseline creatinine around 1.6.  Currently kidney function at baseline.  History of gout: On allopurinol   History of depression: On Citalopram   History of fallopian tube cancer: Currently in remission.  Debility/deconditioning: PT consulted.  She ambulates  with the help of walker and lives with her daughter.       DVT prophylaxis:heparin  injection 5,000 Units Start: 11/06/24 0600     Code Status: Limited: Do not attempt resuscitation (DNR) -DNR-LIMITED -Do Not Intubate/DNI   Family Communication: Discussed with son at bedside on 2/5  Patient status:Inpatient  Patient is from :home  Anticipated discharge un:ynfz  Estimated DC date:2/6   Consultants: Neurology  Procedures: Continuous EEG  Antimicrobials:  Anti-infectives (From admission, onward)    Start     Dose/Rate Route Frequency Ordered Stop   11/06/24 1000  linezolid  (ZYVOX ) IVPB 600 mg  Status:  Discontinued        600 mg 300 mL/hr over 60 Minutes Intravenous Every 12 hours 11/05/24 2238 11/07/24 0734   11/06/24 0600  piperacillin -tazobactam (ZOSYN ) IVPB 3.375 g  Status:  Discontinued        3.375 g 12.5 mL/hr over 240 Minutes Intravenous  Every 8 hours 11/05/24 2238 11/07/24 0734   11/05/24 1930  ceFEPIme  (MAXIPIME ) 2 g in sodium chloride  0.9 % 100 mL IVPB        2 g 200 mL/hr over 30 Minutes Intravenous  Once 11/05/24 1921 11/05/24 2108   11/05/24 1930  vancomycin  (VANCOCIN ) IVPB 1000 mg/200 mL premix  Status:   Discontinued        1,000 mg 200 mL/hr over 60 Minutes Intravenous  Once 11/05/24 1921 11/05/24 1926   11/05/24 1930  vancomycin  (VANCOREADY) IVPB 2000 mg/400 mL        2,000 mg 200 mL/hr over 120 Minutes Intravenous  Once 11/05/24 1926 11/05/24 2306       Subjective: Patient seen and examined at bedside today.  Hemodynamically stable.  Off EEG.  Appears comfortable.  Mostly oriented.  Lying on bed.  Appears weak and deconditioned but not in any kind of distress.  Objective: Vitals:   11/06/24 2328 11/07/24 0107 11/07/24 0604 11/07/24 0739  BP: (!) 113/51  (!) 118/58 (!) 107/49  Pulse: 83 86 82 83  Resp: 18  17 18   Temp: 98.3 F (36.8 C)  98 F (36.7 C) 97.8 F (36.6 C)  TempSrc: Oral  Oral Oral  SpO2: 95% 95% 96% 96%  Weight:      Height:        Intake/Output Summary (Last 24 hours) at 11/07/2024 1033 Last data filed at 11/07/2024 9077 Gross per 24 hour  Intake 3944.65 ml  Output 2050 ml  Net 1894.65 ml   Filed Weights   11/06/24 1142  Weight: 103 kg    Examination:    General exam: Overall comfortable, not in distress,lying on bed HEENT: PERRL Respiratory system:  no wheezes or crackles  Cardiovascular system: S1 & S2 heard, RRR.  Gastrointestinal system: Abdomen is nondistended, soft and nontender. Central nervous system: Alert and mostly oriented Extremities: No edema, no clubbing ,no cyanosis Skin: No rashes, no ulcers,no icterus     Data Reviewed: I have personally reviewed following labs and imaging studies  CBC: Recent Labs  Lab 11/05/24 1300 11/05/24 1314 11/06/24 0811  WBC 8.1  --  6.4  NEUTROABS 5.5  --   --   HGB 12.6 12.9 10.2*  HCT 37.3 38.0 30.7*  MCV 95.2  --  95.3  PLT 124*  --  101*   Basic Metabolic Panel: Recent Labs  Lab 11/05/24 1300 11/05/24 1314 11/06/24 0811 11/07/24 0231  NA 138 137 138 141  K 4.1 3.9 4.2 4.2  CL 101 102 104 106  CO2 26  --  24 26  GLUCOSE 99 93 93 112*  BUN 53* 52* 41* 33*  CREATININE 1.52*  1.70* 1.37* 1.67*  CALCIUM  10.5*  --  9.6 9.6     Recent Results (from the past 240 hours)  Blood culture (routine x 2)     Status: None (Preliminary result)   Collection Time: 11/05/24  8:27 PM   Specimen: BLOOD LEFT ARM  Result Value Ref Range Status   Specimen Description BLOOD LEFT ARM  Final   Special Requests   Final    BOTTLES DRAWN AEROBIC AND ANAEROBIC Blood Culture adequate volume   Culture   Final    NO GROWTH 2 DAYS Performed at [facility], [address] [place], [place] [address]    Report Status PENDING  Incomplete  Blood culture (routine x 2)     Status: None (Preliminary result)   Collection Time: 11/05/24  8:27 PM   Specimen: BLOOD  Result Value Ref Range Status   Specimen Description BLOOD SITE NOT SPECIFIED  Final   Special Requests   Final    BOTTLES DRAWN AEROBIC AND ANAEROBIC Blood Culture adequate volume   Culture   Final    NO GROWTH 2 DAYS Performed at [facility], [address], [place], [place] [address]    Report Status PENDING  Incomplete  MRSA Next Gen by PCR, Nasal     Status: None   Collection Time: 11/06/24  5:15 AM   Specimen: Nasal Mucosa; Nasal Swab  Result Value Ref Range Status   MRSA by PCR Next Gen NOT DETECTED NOT DETECTED Final    Comment: (NOTE) The GeneXpert MRSA Assay (FDA approved for NASAL specimens only), is one component of a comprehensive MRSA colonization surveillance program. It is not intended to diagnose MRSA infection nor to guide or monitor treatment for MRSA infections. Test performance is not FDA approved in patients less than 52 years old. Performed at [facility], [address], [place], [place] [address]      Radiology Studies: Overnight EEG with video Result Date: 11/06/2024 Shelton Arlin KIDD, MD     11/06/2024  9:45 AM Patient Name: Sarah Bishop MRN: [id number] Epilepsy Attending: Arlin KIDD Shelton Referring Physician/Provider: Michaela Aisha SQUIBB, MD Duration: 11/05/2024 1851 to  11/06/2024 0945 Patient history:  84yo F presented for evaluation of garbled speech and repetition of words and just not acting right. EEG to evaluate for seizure Level of alertness: Awake, asleep AEDs during EEG study: LEV, VPA Technical aspects: This EEG study was done with scalp electrodes positioned according to the 10-20 International system of electrode placement. Electrical activity was reviewed with band pass filter of 1-70Hz , sensitivity of 7 uV/mm, display speed of 37mm/sec with a 60Hz  notched filter applied as appropriate. EEG data were recorded continuously and digitally stored.  Video monitoring was available and reviewed as appropriate. Description: The posterior dominant rhythm consists of 8-9 Hz activity of moderate voltage (25-35 uV) seen predominantly in posterior head regions, symmetric and reactive to eye opening and eye closing.  Sleep was characterized by vertex waves, sleep spindles (12 to 14 Hz), maximal frontocentral region. Frequent generalized and maximal posterior quadrant polyspikes were noted which gradually improved. Hyperventilation and photic stimulation were not performed.   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 No seizures were seen throughout the recording. Arlin KIDD Shelton   MR BRAIN WO CONTRAST Result Date: 11/05/2024 EXAM: MRI BRAIN WITHOUT CONTRAST 11/05/2024 04:10:41 PM TECHNIQUE: Multiplanar multisequence MRI of the head/brain was performed without the administration of intravenous contrast. COMPARISON: MRI brain 10/25/2024 and CT head 10/24/2024. CLINICAL HISTORY: expressive aphasia that has since resolved, known seizure disorder recently restarted on Keppra . FINDINGS: BRAIN AND VENTRICLES: No acute infarct. No intracranial hemorrhage. No mass. No midline shift. No hydrocephalus. The sella is unremarkable. Normal flow voids. ORBITS: No significant abnormality. SINUSES AND  MASTOIDS: No significant abnormality. BONES AND SOFT TISSUES: Normal marrow signal. No soft tissue abnormality. IMPRESSION: 1. No acute intracranial abnormality. Electronically signed by: Prentice Spade MD 11/05/2024 04:34 PM EST RP Workstation: [id number]    Scheduled Meds:  allopurinol   300 mg Oral Daily   atenolol   12.5 mg Oral QHS   citalopram   10 mg Oral q morning   divalproex   750 mg Oral Q12H   heparin   5,000 Units Subcutaneous Q8H   insulin   aspart  0-6 Units Subcutaneous TID WC   lactulose   10 g Oral TID   levothyroxine   75 mcg Oral QAC breakfast   sodium chloride  flush  3 mL Intravenous Once   Continuous Infusions:     LOS: 2 days   Sarah Mustache, MD [facility] P2/01/2025, 10:33 AM  "

## 2024-11-07 NOTE — Evaluation (Addendum)
 Physical Therapy Evaluation Patient Details Name: Sarah Bishop MRN: [id number] DOB: 02-20-1941 Today's Date: 11/07/2024  History of Present Illness  Patient is a 84 year old female who was found to be lethargic, confused. Admitted for the management subclinical  seizure. Past medical history of seizure, diabetes type 2, sleep apnea, hypertension, CKD stage IIIb, hypothyroidism.  Clinical Impression  Pt presents with admitting diagnosis above. Session today limited as pt was incontinent of bowels and found to be covered in feces. Min A to transfer to [facility] and back. At baseline, pt daughter states that she lives with her and uses RW/Rollator household distances. Pt daughter declining SNF therefore recommend HHPT upon DC.         If plan is discharge home, recommend the following: A little help with walking and/or transfers;A little help with bathing/dressing/bathroom;Assistance with cooking/housework;Direct supervision/assist for medications management   Can travel by private vehicle        Equipment Recommendations None recommended by PT  Recommendations for Other Services  OT consult    Functional Status Assessment Patient has had a recent decline in their functional status and demonstrates the ability to make significant improvements in function in a reasonable and predictable amount of time.     Precautions / Restrictions Precautions Precautions: Fall Recall of Precautions/Restrictions: Impaired Restrictions Weight Bearing Restrictions Per Provider Order: No      Mobility  Bed Mobility Overal bed mobility: Needs Assistance Bed Mobility: Supine to Sit, Sit to Supine     Supine to sit: Contact guard Sit to supine: Contact guard assist   General bed mobility comments: Increased time however no physical assistance.    Transfers Overall transfer level: Needs assistance Equipment used: Rolling walker (2 wheels) Transfers: Sit to/from Stand, Bed to chair/wheelchair/BSC Sit  to Stand: Min assist   Step pivot transfers: Min assist       General transfer comment: Cues for hand placement. Min A to power up from low surface.    Ambulation/Gait               General Gait Details: Deferred due to bowel incontinence.  Stairs            Wheelchair Mobility     Tilt Bed    Modified Rankin (Stroke Patients Only)       Balance Overall balance assessment: Needs assistance Sitting-balance support: Bilateral upper extremity supported, Feet supported Sitting balance-Leahy Scale: Poor Sitting balance - Comments: CGA   Standing balance support: Bilateral upper extremity supported, During functional activity, Reliant on assistive device for balance Standing balance-Leahy Scale: Poor Standing balance comment: Reliant on RW                             Pertinent Vitals/Pain Pain Assessment Pain Assessment: No/denies pain    Home Living Family/patient expects to be discharged to:: Private residence Living Arrangements: Children Available Help at Discharge: Family;Available 24 hours/day Type of Home: House Home Access: Stairs to enter Entrance Stairs-Rails: Right;Left Entrance Stairs-Number of Steps: 3 Alternate Level Stairs-Number of Steps:  (Uses chair lift to her basement where she lives) Home Layout: Two level Home Equipment: [employer]ant (2 wheels);Tub bench;Grab bars - toilet;Grab bars - tub/shower;Cane - single point;Wheelchair - Public Librarian (4 wheels) (3 wheeled rollator)      Prior Function Prior Level of Function : Independent/Modified Independent             Mobility Comments: Pt reports Mod I RW.  Pt daughter reports that she has needed more assistance over the last 2 weeks. ADLs Comments: Reports IND     Extremity/Trunk Assessment   Upper Extremity Assessment Upper Extremity Assessment: Generalized weakness    Lower Extremity Assessment Lower Extremity Assessment: Generalized weakness     Cervical / Trunk Assessment Cervical / Trunk Assessment: Normal  Communication   Communication Communication: No apparent difficulties    Cognition Arousal: Alert Behavior During Therapy: Flat affect   PT - Cognitive impairments: Difficult to assess Difficult to assess due to: Hard of hearing/deaf                     PT - Cognition Comments: Very flat affect. Requiring increased time to follow commands. Following commands: Impaired Following commands impaired: Follows one step commands inconsistently, Follows one step commands with increased time     Cueing Cueing Techniques: Verbal cues, Tactile cues, Gestural cues, Visual cues     General Comments General comments (skin integrity, edema, etc.): VSS. Daughter present at end of session.    Exercises     Assessment/Plan    PT Assessment Patient needs continued PT services  PT Problem List Decreased strength;Decreased range of motion;Decreased activity tolerance;Decreased balance;Decreased mobility;Decreased coordination;Decreased cognition;Decreased knowledge of use of DME;Decreased safety awareness;Decreased knowledge of precautions;Cardiopulmonary status limiting activity       PT Treatment Interventions DME instruction;Gait training;Stair training;Functional mobility training;Therapeutic activities;Therapeutic exercise;Balance training;Neuromuscular re-education;Cognitive remediation;Patient/family education    PT Goals (Current goals can be found in the Care Plan section)  Acute Rehab PT Goals Patient Stated Goal: to get better PT Goal Formulation: With patient Time For Goal Achievement: 11/21/24 Potential to Achieve Goals: Fair    Frequency Min 2X/week     Co-evaluation               AM-PAC PT 6 Clicks Mobility  Outcome Measure Help needed turning from your back to your side while in a flat bed without using bedrails?: A Little Help needed moving from lying on your back to sitting on the  side of a flat bed without using bedrails?: A Little Help needed moving to and from a bed to a chair (including a wheelchair)?: A Little Help needed standing up from a chair using your arms (e.g., wheelchair or bedside chair)?: A Little Help needed to walk in hospital room?: A Lot Help needed climbing 3-5 steps with a railing? : Total 6 Click Score: 15    End of Session Equipment Utilized During Treatment: Gait belt Activity Tolerance: Patient tolerated treatment well;Other (comment) (Limited by bowel incontinence.) Patient left: in bed;with call bell/phone within reach;with bed alarm set;with family/visitor present;with nursing/sitter in room Nurse Communication: Mobility status PT Visit Diagnosis: Other abnormalities of gait and mobility (R26.89)    Time: 1451-1530 PT Time Calculation (min) (ACUTE ONLY): 39 min   Charges:   PT Evaluation $PT Eval Moderate Complexity: 1 Mod PT Treatments $Therapeutic Activity: 23-37 mins PT General Charges $$ ACUTE PT VISIT: 1 Visit         Dillyn Joaquin B, PT, DPT Acute Rehab Services [phone]   Lenzi Marmo 11/07/2024, 4:56 PM

## 2024-11-07 NOTE — Plan of Care (Signed)

## 2024-11-07 NOTE — Progress Notes (Signed)
 Subjective: NAEO. Awake, alert, eating breakfast. Still physically weak per son but otherwise doing well.   ROS: negative except above  Examination  Vital signs in last 24 hours: Temp:  [97.8 F (36.6 C)-99.1 F (37.3 C)] 97.8 F (36.6 C) (02/05 1208) Pulse Rate:  [70-88] 70 (02/05 1208) Resp:  [17-19] 18 (02/05 1208) BP: (104-118)/(47-58) 113/55 (02/05 1208) SpO2:  [95 %-96 %] 95 % (02/05 1208)  General: sitting in bed, NAD Neuro: MS: Alert, oriented, follows commands CN: pupils equal and reactive,  EOMI, face symmetric, tongue midline, normal sensation over face Motor: 4/5 strength in all 4 extremities Coordination: normal Gait: not tested  Basic Metabolic Panel: Recent Labs  Lab 11/05/24 1300 11/05/24 1314 11/06/24 0811 11/07/24 0231  NA 138 137 138 141  K 4.1 3.9 4.2 4.2  CL 101 102 104 106  CO2 26  --  24 26  GLUCOSE 99 93 93 112*  BUN 53* 52* 41* 33*  CREATININE 1.52* 1.70* 1.37* 1.67*  CALCIUM  10.5*  --  9.6 9.6    CBC: Recent Labs  Lab 11/05/24 1300 11/05/24 1314 11/06/24 0811  WBC 8.1  --  6.4  NEUTROABS 5.5  --   --   HGB 12.6 12.9 10.2*  HCT 37.3 38.0 30.7*  MCV 95.2  --  95.3  PLT 124*  --  101*     Coagulation Studies: Recent Labs    11/05/24 1309  LABPROT 14.1  INR 1.0    Imaging No new brain imaging  ASSESSMENT AND PLAN: 65 old female with history of primary generalized epilepsy who remains seizure-free for almost 30 years and therefore was weaned off of Depakote .  Unfortunately had seizure recurrence in end of January and was started on Keppra .  Since then patient has had some episodes of confusion.  EEG showed recurrence of epileptiform discharges.  Was switched to Depakote  after which epileptiform discharges improved.   Epilepsy Acute encephalopathy, resolved Hyperammonemia due to depakote  use - Likely due to side effect of Keppra  - Patient was on depakote  750mg  daily so will reduce VPA to 750mg  qam for now - Hyperammonemia  will improve with reduced ose of Depakote  as well as lactulose   - PT/OT - Discussed plan with family at bedside and Dr Jillian via secure chat - Continue seizure precautions - F/u with Dr Georjean  Seizure precautions: Per [place]  DMV statutes, patients with seizures are not allowed to drive until they have been seizure-free for six months and cleared by a physician    Use caution when using heavy equipment or power tools. Avoid working on ladders or at heights. Take showers instead of baths. Ensure the water  temperature is not too high on the home water  heater. Do not go swimming alone. Do not lock yourself in a room alone (i.e. bathroom). When caring for infants or small children, sit down when holding, feeding, or changing them to minimize risk of injury to the child in the event you have a seizure. Maintain good sleep hygiene. Avoid alcohol.    If patient has another seizure, call 911 and bring them back to the ED if: A.  The seizure lasts longer than 5 minutes.      B.  The patient doesn't wake shortly after the seizure or has new problems such as difficulty seeing, speaking or moving following the seizure C.  The patient was injured during the seizure D.  The patient has a temperature over 102 F (39C) E.  The patient vomited  during the seizure and now is having trouble breathing    During the Seizure   - First, ensure adequate ventilation and place patients on the floor on their left side  Loosen clothing around the neck and ensure the airway is patent. If the patient is clenching the teeth, do not force the mouth open with any object as this can cause severe damage - Remove all items from the surrounding that can be hazardous. The patient may be oblivious to what's happening and may not even know what he or she is doing. If the patient is confused and wandering, either gently guide him/her away and block access to outside areas - Reassure the individual and be comforting - Call  911. In most cases, the seizure ends before EMS arrives. However, there are cases when seizures may last over 3 to 5 minutes. Or the individual may have developed breathing difficulties or severe injuries. If a pregnant patient or a person with diabetes develops a seizure, it is prudent to call an ambulance.   After the Seizure (Postictal Stage)   After a seizure, most patients experience confusion, fatigue, muscle pain and/or a headache. Thus, one should permit the individual to sleep. For the next few days, reassurance is essential. Being calm and helping reorient the person is also of importance.   Most seizures are painless and end spontaneously. Seizures are not harmful to others but can lead to complications such as stress on the lungs, brain and the heart. Individuals with prior lung problems may develop labored breathing and respiratory distress.            I personally spent a total of 38 minutes in the care of the patient today including getting/reviewing separately obtained history, performing a medically appropriate exam/evaluation, counseling and educating, placing orders, referring and communicating with other health care professionals, documenting clinical information in the EHR, independently interpreting results, and coordinating care.        Arlin Krebs Epilepsy [facility] For questions after 5pm please refer to AMION to reach the Neurologist on call ]

## 2024-11-07 NOTE — Progress Notes (Signed)
 Bair hugger placed on patient at this time for low rectal temp.  Patient and daughter at bedside educated on use of warming blanket.

## 2024-11-07 NOTE — Progress Notes (Signed)
 MEWS Progress Note  Patient Details Name: Sarah Bishop MRN: [id number] DOB: Jul 14, 1941 Today's Date: 11/07/2024   MEWS Flowsheet Documentation:  Assess: MEWS Score Temp: (!) 92.3 F (33.5 C) BP: (!) 104/48 MAP (mmHg): (!) 61 Pulse Rate: 70 ECG Heart Rate: 66 Resp: 16 Level of Consciousness: Alert SpO2: 100 % O2 Device: Room Air Assess: MEWS Score MEWS Temp: 2 MEWS Systolic: 0 MEWS Pulse: 0 MEWS RR: 0 MEWS LOC: 0 MEWS Score: 2 MEWS Score Color: Yellow Assess: SIRS CRITERIA SIRS Temperature : 1 SIRS Respirations : 0 SIRS Pulse: 0 SIRS WBC: 0 SIRS Score Sum : 1 SIRS Temperature : 1 SIRS Pulse: 0 SIRS Respirations : 0 SIRS WBC: 0 SIRS Score Sum : 1 Assess: if the MEWS score is Yellow or Red Were vital signs accurate and taken at a resting state?: Yes Does the patient meet 2 or more of the SIRS criteria?: No MEWS guidelines implemented : Yes, yellow Treat MEWS Interventions: Considered administering scheduled or prn medications/treatments as ordered Take Vital Signs Increase Vital Sign Frequency : Yellow: Q2hr x1, continue Q4hrs until patient remains green for 12hrs Escalate MEWS: Escalate: Yellow: Discuss with charge nurse and consider notifying provider and/or RRT Notify: Charge Nurse/RN Name of Charge Nurse/RN Notified: Tracey RN  Niel Porter ORN 11/07/2024, 6:10 PM

## 2024-11-08 ENCOUNTER — Inpatient Hospital Stay (HOSPITAL_COMMUNITY)

## 2024-11-08 LAB — BASIC METABOLIC PANEL WITH GFR
Anion gap: 10 (ref 5–15)
BUN: 26 mg/dL — ABNORMAL HIGH (ref 8–23)
CO2: 26 mmol/L (ref 22–32)
Calcium: 10.1 mg/dL (ref 8.9–10.3)
Chloride: 105 mmol/L (ref 98–111)
Creatinine, Ser: 1.53 mg/dL — ABNORMAL HIGH (ref 0.44–1.00)
GFR, Estimated: 33 mL/min — ABNORMAL LOW
Glucose, Bld: 84 mg/dL (ref 70–99)
Potassium: 4 mmol/L (ref 3.5–5.1)
Sodium: 140 mmol/L (ref 135–145)

## 2024-11-08 LAB — GLUCOSE, CAPILLARY
Glucose-Capillary: 86 mg/dL (ref 70–99)
Glucose-Capillary: 90 mg/dL (ref 70–99)
Glucose-Capillary: 104 mg/dL — ABNORMAL HIGH (ref 70–99)
Glucose-Capillary: 117 mg/dL — ABNORMAL HIGH (ref 70–99)

## 2024-11-08 LAB — CULTURE, BLOOD (ROUTINE X 2)
Culture: NO GROWTH
Culture: NO GROWTH
Special Requests: ADEQUATE
Special Requests: ADEQUATE

## 2024-11-08 LAB — LEVETIRACETAM LEVEL: Levetiracetam Lvl: 30.5 ug/mL (ref 10.0–40.0)

## 2024-11-08 LAB — AMMONIA: Ammonia: 33 umol/L (ref 9–35)

## 2024-11-08 NOTE — Plan of Care (Addendum)
 Pt has no hypothermic episode throughout the night , but still with Bair Hugger to keep her warm.   Problem: Education: Goal: Knowledge of General Education information will improve Description: Including pain rating scale, medication(s)/side effects and non-pharmacologic comfort measures Outcome: Progressing   Problem: Health Behavior/Discharge Planning: Goal: Ability to manage health-related needs will improve Outcome: Progressing   Problem: Clinical Measurements: Goal: Will remain free from infection Outcome: Progressing   Problem: Clinical Measurements: Goal: Respiratory complications will improve Outcome: Progressing   Problem: Activity: Goal: Risk for activity intolerance will decrease Outcome: Progressing   Problem: Nutrition: Goal: Adequate nutrition will be maintained Outcome: Progressing   Problem: Coping: Goal: Level of anxiety will decrease Outcome: Progressing   Problem: Elimination: Goal: Will not experience complications related to bowel motility Outcome: Progressing   Problem: Safety: Goal: Ability to remain free from injury will improve Outcome: Progressing   Problem: Skin Integrity: Goal: Risk for impaired skin integrity will decrease Outcome: Progressing

## 2024-11-08 NOTE — TOC Progression Note (Signed)
 Transition of Care [facility]) - Progression Note    Patient Details  Name: Sarah Bishop MRN: [id number] Date of Birth: [birth year]  Transition of Care [facility]) CM/SW Contact  Andrez JULIANNA George, RN Phone Number: 11/08/2024, 2:39 PM  Clinical Narrative:    Pt with recommendations for HHPT. Per notes the family doesn't want SNF. CM called pts daughter and she confirmed they dont want SNF. CM provided offers for [facility] services and she selected [facility]. Information on the AVS. Hedda accepted the referral. ICM following for further dc needs.   Expected Discharge Plan:  (TBD) Barriers to Discharge: Continued Medical Work up               Expected Discharge Plan and Services   Discharge Planning Services: CM Consult   Living arrangements for the past 2 months: Single Family Home                           HH Arranged: PT, OT HH Agency: [facility] Date [facility] Agency Contacted: 11/08/24   Representative spoke with at [facility] Agency: Darleene   Social Drivers of Health (SDOH) Interventions SDOH Screenings   Food Insecurity: No Food Insecurity (11/06/2024)  Housing: Low Risk (11/06/2024)  Transportation Needs: No Transportation Needs (11/06/2024)  Utilities: Not At Risk (11/06/2024)  Alcohol Screen: Low Risk (09/04/2023)  Depression (PHQ2-9): Low Risk (09/05/2024)  Financial Resource Strain: Low Risk (09/04/2023)  Physical Activity: Patient Declined (09/05/2024)  Social Connections: Socially Isolated (11/06/2024)  Stress: No Stress Concern Present (09/05/2024)  Tobacco Use: Medium Risk (11/05/2024)  Health Literacy: Adequate Health Literacy (09/05/2024)    Readmission Risk Interventions     No data to display

## 2024-11-08 NOTE — Progress Notes (Signed)
 + PROGRESS NOTE  Sarah Bishop  FMW:[id number] DOB: 04/23/1941 DOA: 11/05/2024 PCP: [name], [name], MD   Brief Narrative: Patient is a 84 year old female with history of seizure, diabetes type 2, sleep apnea, hypertension, CKD stage IIIb, hypothyroidism who was brought to the emergency department after she was found to be lethargic, confused.  She was recently started on Keppra  for seizure-like activity.  On presentation, she was hemodynamically stable.  MRI did not show any acute findings.  Patient was also hypothermic.  Placed on [employer]  Neurology consulted.  Admitted for the management for possible subclinical  seizure.  She was on cEEG monitoring, now discontinued.   PT consulted, recommended home health.  Neurology recommended to continue Depakote  750 mg. Remains sleepy/drowsy today.  Discharge plan canceled  Assessment & Plan:  Principal Problem:   Seizure [facility]) Active Problems:   Hypertension   History of epilepsy   Fallopian tube cancer, carcinoma, left (HCC)   CKD (chronic kidney disease), stage III (HCC)   OSA on CPAP   Controlled type 2 diabetes mellitus with complication, without long-term current use of insulin  (HCC)   Lymphedema of both lower extremities   Hyperlipidemia   Hypothyroidism   Status epilepticus (HCC)   Hypothermia   Hypercalcemia   Subclinical seizures/acute metabolic encephalopathy: She was taking Depakote  in the past.  It was stopped last year.  Patient had presented to [facility] with seizure-like activity and was started on Keppra .  Presented with confusion, lethargy.  Concern for subclinical seizures.  Neurology consulted and following.  Restarted on Depakote .  Status post continuous EEG. Encephalopathy suspected to be from Keppra .  Now discontinued UA not suggestive of UTI.  TSH normal, cortisol level normal as well. Elevated ammonia is likely from Depakote .  Started on low-dose lactulose ,now ammonia normalized. Mentation had improved but  she is again drowsy/sleepy today.  Will continue monitoring.  Hypothermia: Likely exposure to cold environment.  Cortisol level, TSH normal.  Antibiotics were  started for the suspicion of sepsis.  Follow-up cultures:NGTD.  Antibiotics discontinued  History of hypertension: Monitor blood pressure.  On amlodipine , atenolol  at home.  Currently on low dose atenolol  only because of soft blood pressure   Chronic bilateral lower extremity edema: Takes Lasix at home.  Currently on hold.  Diabetes type 2: On Jardiance , semaglutide .  Currently on sliding scale.  A1c of 6.4 as per 06/24/2024  Hypothyroidism: Synthyroid  History of sleep apnea: On CPAP at home  Thrombocytopenia: Continue to monitor.  CKD stage IIIb: Baseline creatinine around 1.6.  Currently kidney function at baseline.  History of gout: On allopurinol   History of depression: On Citalopram   History of fallopian tube cancer: Currently in remission.  Debility/deconditioning: PT consulted.  She ambulates  with the help of walker and lives with her daughter.  Home health recommended       DVT prophylaxis:heparin  injection 5,000 Units Start: 11/06/24 0600     Code Status: Limited: Do not attempt resuscitation (DNR) -DNR-LIMITED -Do Not Intubate/DNI   Family Communication: Discussed with son at bedside on 2/5.Discussed with daughter Stephane on phone on 2/6  Patient status:Inpatient  Patient is from :home  Anticipated discharge un:ynfz  Estimated DC date: Tomorrow after improvement in the mentation   Consultants: Neurology  Procedures: Continuous EEG  Antimicrobials:  Anti-infectives (From admission, onward)    Start     Dose/Rate Route Frequency Ordered Stop   11/06/24 1000  linezolid  (ZYVOX ) IVPB 600 mg  Status:  Discontinued  600 mg 300 mL/hr over 60 Minutes Intravenous Every 12 hours 11/05/24 2238 11/07/24 0734   11/06/24 0600  piperacillin -tazobactam (ZOSYN ) IVPB 3.375 g  Status:  Discontinued         3.375 g 12.5 mL/hr over 240 Minutes Intravenous Every 8 hours 11/05/24 2238 11/07/24 0734   11/05/24 1930  ceFEPIme  (MAXIPIME ) 2 g in sodium chloride  0.9 % 100 mL IVPB        2 g 200 mL/hr over 30 Minutes Intravenous  Once 11/05/24 1921 11/05/24 2108   11/05/24 1930  vancomycin  (VANCOCIN ) IVPB 1000 mg/200 mL premix  Status:  Discontinued        1,000 mg 200 mL/hr over 60 Minutes Intravenous  Once 11/05/24 1921 11/05/24 1926   11/05/24 1930  vancomycin  (VANCOREADY) IVPB 2000 mg/400 mL        2,000 mg 200 mL/hr over 120 Minutes Intravenous  Once 11/05/24 1926 11/05/24 2306       Subjective: Seen and examined at the bedside today and again this afternoon.  Today she is sleepy.  Drowsy, lying on bed.  Wakes up when calling her name.  Vitals are good.  Will continue to monitor  Objective: Vitals:   11/07/24 1947 11/07/24 2351 11/08/24 0401 11/08/24 0752  BP: (!) 109/52 (!) 113/51 (!) 122/58   Pulse: 73 87 (!) 105 (!) 105  Resp: 16 18    Temp: (!) 97.4 F (36.3 C) 97.7 F (36.5 C) 98.7 F (37.1 C) 99.2 F (37.3 C)  TempSrc: Oral Oral Oral Oral  SpO2: 95% 95% 95%   Weight:      Height:        Intake/Output Summary (Last 24 hours) at 11/08/2024 1245 Last data filed at 11/07/2024 2200 Gross per 24 hour  Intake 160 ml  Output --  Net 160 ml   Filed Weights   11/06/24 1142  Weight: 103 kg    Examination:  General exam: Lying in bed, sleepy/drowsy HEENT: PERRL Respiratory system:  no wheezes or crackles  Cardiovascular system: S1 & S2 heard, RRR.  Gastrointestinal system: Abdomen is nondistended, soft and nontender. Central nervous system: Sleepy/drowsy, no focal deficits Extremities: No edema, no clubbing ,no cyanosis Skin: No rashes, no ulcers,no icterus    Data Reviewed: I have personally reviewed following labs and imaging studies  CBC: Recent Labs  Lab 11/05/24 1300 11/05/24 1314 11/06/24 0811  WBC 8.1  --  6.4  NEUTROABS 5.5  --   --   HGB 12.6 12.9 10.2*   HCT 37.3 38.0 30.7*  MCV 95.2  --  95.3  PLT 124*  --  101*   Basic Metabolic Panel: Recent Labs  Lab 11/05/24 1300 11/05/24 1314 11/06/24 0811 11/07/24 0231 11/08/24 0116  NA 138 137 138 141 140  K 4.1 3.9 4.2 4.2 4.0  CL 101 102 104 106 105  CO2 26  --  24 26 26   GLUCOSE 99 93 93 112* 84  BUN 53* 52* 41* 33* 26*  CREATININE 1.52* 1.70* 1.37* 1.67* 1.53*  CALCIUM  10.5*  --  9.6 9.6 10.1     Recent Results (from the past 240 hours)  Blood culture (routine x 2)     Status: None (Preliminary result)   Collection Time: 11/05/24  8:27 PM   Specimen: BLOOD LEFT ARM  Result Value Ref Range Status   Specimen Description BLOOD LEFT ARM  Final   Special Requests   Final    BOTTLES DRAWN AEROBIC AND ANAEROBIC Blood Culture  adequate volume   Culture   Final    NO GROWTH 3 DAYS Performed at [facility], [address] [place], [place] [address]    Report Status PENDING  Incomplete  Blood culture (routine x 2)     Status: None (Preliminary result)   Collection Time: 11/05/24  8:27 PM   Specimen: BLOOD  Result Value Ref Range Status   Specimen Description BLOOD SITE NOT SPECIFIED  Final   Special Requests   Final    BOTTLES DRAWN AEROBIC AND ANAEROBIC Blood Culture adequate volume   Culture   Final    NO GROWTH 3 DAYS Performed at [facility], [address], [place], [place] [address]    Report Status PENDING  Incomplete  MRSA Next Gen by PCR, Nasal     Status: None   Collection Time: 11/06/24  5:15 AM   Specimen: Nasal Mucosa; Nasal Swab  Result Value Ref Range Status   MRSA by PCR Next Gen NOT DETECTED NOT DETECTED Final    Comment: (NOTE) The GeneXpert MRSA Assay (FDA approved for NASAL specimens only), is one component of a comprehensive MRSA colonization surveillance program. It is not intended to diagnose MRSA infection nor to guide or monitor treatment for MRSA infections. Test performance is not FDA approved in patients less than 60  years old. Performed at [facility], [address] [place], [place] [address]      Radiology Studies: No results found.   Scheduled Meds:  allopurinol   300 mg Oral Daily   atenolol   12.5 mg Oral QHS   citalopram   10 mg Oral q morning   divalproex   750 mg Oral QPC breakfast   heparin   5,000 Units Subcutaneous Q8H   insulin  aspart  0-6 Units Subcutaneous TID WC   lactulose   10 g Oral TID   levothyroxine   75 mcg Oral QAC breakfast   sodium chloride  flush  3 mL Intravenous Once   Continuous Infusions:     LOS: 3 days   Ivonne Mustache, MD [facility] P2/03/2025, 12:45 PM

## 2024-11-08 NOTE — Progress Notes (Cosign Needed)
 LTM VIDEO EEG hooked up and running - no initial skin breakdown - push button tested - [facility] is monitoring. Pt may only run LTM for 3-4 hours per Neurology

## 2024-11-08 NOTE — Plan of Care (Signed)
 Was screaming loud in early morning hours but is responding better at the moment. Encouraged her to have meals.    Problem: Education: Goal: Knowledge of General Education information will improve Description: Including pain rating scale, medication(s)/side effects and non-pharmacologic comfort measures Outcome: Progressing   Problem: Health Behavior/Discharge Planning: Goal: Ability to manage health-related needs will improve Outcome: Progressing   Problem: Clinical Measurements: Goal: Will remain free from infection Outcome: Progressing   Problem: Clinical Measurements: Goal: Respiratory complications will improve Outcome: Progressing   Problem: Activity: Goal: Risk for activity intolerance will decrease Outcome: Progressing   Problem: Nutrition: Goal: Adequate nutrition will be maintained Outcome: Progressing   Problem: Coping: Goal: Level of anxiety will decrease Outcome: Progressing   Problem: Elimination: Goal: Will not experience complications related to bowel motility Outcome: Progressing   Problem: Safety: Goal: Ability to remain free from injury will improve Outcome: Progressing   Problem: Skin Integrity: Goal: Risk for impaired skin integrity will decrease Outcome: Progressing   Problem: Coping: Goal: Ability to adjust to condition or change in health will improve Outcome: Progressing   Problem: Nutritional: Goal: Maintenance of adequate nutrition will improve Outcome: Progressing   Problem: Skin Integrity: Goal: Risk for impaired skin integrity will decrease Outcome: Progressing

## 2024-11-26 ENCOUNTER — Inpatient Hospital Stay: Attending: Gynecologic Oncology | Admitting: Gynecologic Oncology

## 2024-12-12 ENCOUNTER — Inpatient Hospital Stay: Attending: Gynecologic Oncology

## 2024-12-19 ENCOUNTER — Ambulatory Visit: Admitting: Hematology and Oncology

## 2024-12-23 ENCOUNTER — Ambulatory Visit

## 2025-03-21 ENCOUNTER — Inpatient Hospital Stay: Admitting: Gynecologic Oncology

## 2025-03-21 ENCOUNTER — Inpatient Hospital Stay
